# Patient Record
Sex: Female | Born: 1935
Health system: Southern US, Community
[De-identification: ages and names within clinical notes are randomized; demographics above are authoritative.]

## PROBLEM LIST (undated history)

## (undated) ENCOUNTER — Ambulatory Visit (HOSPITAL_BASED_OUTPATIENT_CLINIC_OR_DEPARTMENT_OTHER): Admission: EM | Source: Home / Self Care

## (undated) DIAGNOSIS — G5601 Carpal tunnel syndrome, right upper limb: Secondary | ICD-10-CM

## (undated) DIAGNOSIS — S8262XA Displaced fracture of lateral malleolus of left fibula, initial encounter for closed fracture: Secondary | ICD-10-CM

## (undated) DIAGNOSIS — M79641 Pain in right hand: Secondary | ICD-10-CM

## (undated) DIAGNOSIS — M47812 Spondylosis without myelopathy or radiculopathy, cervical region: Secondary | ICD-10-CM

## (undated) DIAGNOSIS — Z01810 Encounter for preprocedural cardiovascular examination: Secondary | ICD-10-CM

## (undated) DIAGNOSIS — M65331 Trigger finger, right middle finger: Secondary | ICD-10-CM

## (undated) DIAGNOSIS — R55 Syncope and collapse: Secondary | ICD-10-CM

## (undated) DIAGNOSIS — Z950 Presence of cardiac pacemaker: Secondary | ICD-10-CM

## (undated) DIAGNOSIS — M1811 Unilateral primary osteoarthritis of first carpometacarpal joint, right hand: Secondary | ICD-10-CM

## (undated) DIAGNOSIS — I452 Bifascicular block: Secondary | ICD-10-CM

## (undated) DIAGNOSIS — I4510 Unspecified right bundle-branch block: Secondary | ICD-10-CM

## (undated) DIAGNOSIS — I6200 Nontraumatic subdural hemorrhage, unspecified: Secondary | ICD-10-CM

## (undated) DIAGNOSIS — G8929 Other chronic pain: Secondary | ICD-10-CM

## (undated) DIAGNOSIS — K222 Esophageal obstruction: Secondary | ICD-10-CM

## (undated) DIAGNOSIS — E039 Hypothyroidism, unspecified: Secondary | ICD-10-CM

## (undated) DIAGNOSIS — E785 Hyperlipidemia, unspecified: Secondary | ICD-10-CM

## (undated) DIAGNOSIS — T1490XA Injury, unspecified, initial encounter: Secondary | ICD-10-CM

## (undated) HISTORY — DX: Injury, unspecified, initial encounter: T14.90XA

## (undated) HISTORY — DX: Hypothyroidism, unspecified: E03.9

## (undated) HISTORY — PX: TOTAL HIP ARTHROPLASTY: SHX124

## (undated) HISTORY — PX: TONSILLECTOMY: SUR1361

## (undated) HISTORY — DX: Trigger finger, right middle finger: M65.331

## (undated) HISTORY — DX: Spondylosis without myelopathy or radiculopathy, cervical region: M47.812

## (undated) HISTORY — PX: EYE SURGERY: SHX253

## (undated) HISTORY — DX: Unilateral primary osteoarthritis of first carpometacarpal joint, right hand: M18.11

## (undated) HISTORY — DX: Unspecified right bundle-branch block: I45.10

## (undated) HISTORY — PX: ORTHOPEDIC SURGERY: SHX850

## (undated) HISTORY — DX: Nontraumatic subdural hemorrhage, unspecified: I62.00

## (undated) HISTORY — PX: APPENDECTOMY: SHX54

## (undated) HISTORY — DX: Carpal tunnel syndrome, right upper limb: G56.01

## (undated) HISTORY — DX: Other chronic pain: G89.29

## (undated) HISTORY — DX: Displaced fracture of lateral malleolus of left fibula, initial encounter for closed fracture: S82.62XA

## (undated) HISTORY — DX: Encounter for preprocedural cardiovascular examination: Z01.810

## (undated) HISTORY — DX: Hyperlipidemia, unspecified: E78.5

## (undated) HISTORY — DX: Esophageal obstruction: K22.2

## (undated) HISTORY — PX: OTHER SURGICAL HISTORY: SHX169

## (undated) HISTORY — DX: Pain in right hand: M79.641

## (undated) HISTORY — DX: Bifascicular block: I45.2

## (undated) HISTORY — PX: ABDOMINAL HYSTERECTOMY: SHX81

---

## 1989-01-31 DIAGNOSIS — G14 Postpolio syndrome: Secondary | ICD-10-CM

## 1989-01-31 HISTORY — DX: Postpolio syndrome: G14

## 2000-01-06 ENCOUNTER — Encounter: Admission: RE | Admit: 2000-01-06 | Discharge: 2000-01-06 | Payer: Self-pay | Admitting: Otolaryngology

## 2000-01-06 ENCOUNTER — Encounter: Payer: Self-pay | Admitting: Otolaryngology

## 2001-02-24 ENCOUNTER — Encounter: Admission: RE | Admit: 2001-02-24 | Discharge: 2001-02-24 | Payer: Self-pay | Admitting: Orthopedic Surgery

## 2001-02-24 ENCOUNTER — Encounter: Payer: Self-pay | Admitting: Orthopedic Surgery

## 2014-06-10 DIAGNOSIS — Z6822 Body mass index (BMI) 22.0-22.9, adult: Secondary | ICD-10-CM | POA: Diagnosis not present

## 2014-06-10 DIAGNOSIS — M25531 Pain in right wrist: Secondary | ICD-10-CM | POA: Diagnosis not present

## 2014-07-01 DIAGNOSIS — M1811 Unilateral primary osteoarthritis of first carpometacarpal joint, right hand: Secondary | ICD-10-CM | POA: Diagnosis not present

## 2014-07-01 DIAGNOSIS — M654 Radial styloid tenosynovitis [de Quervain]: Secondary | ICD-10-CM | POA: Diagnosis not present

## 2014-07-01 DIAGNOSIS — M65321 Trigger finger, right index finger: Secondary | ICD-10-CM | POA: Diagnosis not present

## 2014-07-01 DIAGNOSIS — M65322 Trigger finger, left index finger: Secondary | ICD-10-CM | POA: Diagnosis not present

## 2014-07-14 DIAGNOSIS — S40022A Contusion of left upper arm, initial encounter: Secondary | ICD-10-CM | POA: Diagnosis not present

## 2014-07-14 DIAGNOSIS — R079 Chest pain, unspecified: Secondary | ICD-10-CM | POA: Diagnosis not present

## 2014-07-14 DIAGNOSIS — S5012XA Contusion of left forearm, initial encounter: Secondary | ICD-10-CM | POA: Diagnosis not present

## 2014-07-14 DIAGNOSIS — R51 Headache: Secondary | ICD-10-CM | POA: Diagnosis not present

## 2014-07-14 DIAGNOSIS — S0990XA Unspecified injury of head, initial encounter: Secondary | ICD-10-CM | POA: Diagnosis not present

## 2014-07-14 DIAGNOSIS — R4182 Altered mental status, unspecified: Secondary | ICD-10-CM | POA: Diagnosis not present

## 2014-07-22 DIAGNOSIS — D509 Iron deficiency anemia, unspecified: Secondary | ICD-10-CM | POA: Diagnosis not present

## 2014-07-22 DIAGNOSIS — Z0000 Encounter for general adult medical examination without abnormal findings: Secondary | ICD-10-CM | POA: Diagnosis not present

## 2014-07-22 DIAGNOSIS — Z23 Encounter for immunization: Secondary | ICD-10-CM | POA: Diagnosis not present

## 2014-07-22 DIAGNOSIS — E039 Hypothyroidism, unspecified: Secondary | ICD-10-CM | POA: Diagnosis not present

## 2014-07-22 DIAGNOSIS — E785 Hyperlipidemia, unspecified: Secondary | ICD-10-CM | POA: Diagnosis not present

## 2014-07-29 DIAGNOSIS — M654 Radial styloid tenosynovitis [de Quervain]: Secondary | ICD-10-CM | POA: Diagnosis not present

## 2014-08-05 DIAGNOSIS — S79921A Unspecified injury of right thigh, initial encounter: Secondary | ICD-10-CM | POA: Diagnosis not present

## 2014-08-05 DIAGNOSIS — M25561 Pain in right knee: Secondary | ICD-10-CM | POA: Diagnosis not present

## 2014-08-05 DIAGNOSIS — Z9181 History of falling: Secondary | ICD-10-CM | POA: Diagnosis not present

## 2014-08-05 DIAGNOSIS — M25551 Pain in right hip: Secondary | ICD-10-CM | POA: Diagnosis not present

## 2014-08-05 DIAGNOSIS — S8991XA Unspecified injury of right lower leg, initial encounter: Secondary | ICD-10-CM | POA: Diagnosis not present

## 2014-08-05 DIAGNOSIS — S79911A Unspecified injury of right hip, initial encounter: Secondary | ICD-10-CM | POA: Diagnosis not present

## 2014-08-05 DIAGNOSIS — M179 Osteoarthritis of knee, unspecified: Secondary | ICD-10-CM | POA: Diagnosis not present

## 2014-08-05 DIAGNOSIS — M899 Disorder of bone, unspecified: Secondary | ICD-10-CM | POA: Diagnosis not present

## 2014-08-05 DIAGNOSIS — Z6822 Body mass index (BMI) 22.0-22.9, adult: Secondary | ICD-10-CM | POA: Diagnosis not present

## 2014-09-09 DIAGNOSIS — M654 Radial styloid tenosynovitis [de Quervain]: Secondary | ICD-10-CM | POA: Diagnosis not present

## 2014-10-07 DIAGNOSIS — M1811 Unilateral primary osteoarthritis of first carpometacarpal joint, right hand: Secondary | ICD-10-CM | POA: Diagnosis not present

## 2014-10-10 DIAGNOSIS — E611 Iron deficiency: Secondary | ICD-10-CM | POA: Diagnosis not present

## 2014-10-10 DIAGNOSIS — Z6822 Body mass index (BMI) 22.0-22.9, adult: Secondary | ICD-10-CM | POA: Diagnosis not present

## 2014-10-10 DIAGNOSIS — R002 Palpitations: Secondary | ICD-10-CM | POA: Diagnosis not present

## 2014-10-10 DIAGNOSIS — E039 Hypothyroidism, unspecified: Secondary | ICD-10-CM | POA: Diagnosis not present

## 2014-12-25 DIAGNOSIS — Z6822 Body mass index (BMI) 22.0-22.9, adult: Secondary | ICD-10-CM | POA: Diagnosis not present

## 2014-12-25 DIAGNOSIS — M179 Osteoarthritis of knee, unspecified: Secondary | ICD-10-CM | POA: Diagnosis not present

## 2014-12-25 DIAGNOSIS — M25562 Pain in left knee: Secondary | ICD-10-CM | POA: Diagnosis not present

## 2015-01-02 DIAGNOSIS — M25462 Effusion, left knee: Secondary | ICD-10-CM | POA: Diagnosis not present

## 2015-01-02 DIAGNOSIS — M1712 Unilateral primary osteoarthritis, left knee: Secondary | ICD-10-CM | POA: Diagnosis not present

## 2015-01-14 DIAGNOSIS — M25562 Pain in left knee: Secondary | ICD-10-CM | POA: Diagnosis not present

## 2015-01-14 DIAGNOSIS — M1712 Unilateral primary osteoarthritis, left knee: Secondary | ICD-10-CM | POA: Diagnosis not present

## 2015-01-16 DIAGNOSIS — M1712 Unilateral primary osteoarthritis, left knee: Secondary | ICD-10-CM | POA: Diagnosis not present

## 2015-01-16 DIAGNOSIS — S83242A Other tear of medial meniscus, current injury, left knee, initial encounter: Secondary | ICD-10-CM | POA: Diagnosis not present

## 2015-01-17 DIAGNOSIS — Z23 Encounter for immunization: Secondary | ICD-10-CM | POA: Diagnosis not present

## 2015-01-17 DIAGNOSIS — I452 Bifascicular block: Secondary | ICD-10-CM | POA: Diagnosis not present

## 2015-01-17 DIAGNOSIS — Z6822 Body mass index (BMI) 22.0-22.9, adult: Secondary | ICD-10-CM | POA: Diagnosis not present

## 2015-01-17 DIAGNOSIS — R399 Unspecified symptoms and signs involving the genitourinary system: Secondary | ICD-10-CM | POA: Diagnosis not present

## 2015-01-17 DIAGNOSIS — M23322 Other meniscus derangements, posterior horn of medial meniscus, left knee: Secondary | ICD-10-CM | POA: Diagnosis not present

## 2015-01-17 DIAGNOSIS — M1712 Unilateral primary osteoarthritis, left knee: Secondary | ICD-10-CM | POA: Diagnosis not present

## 2015-01-23 DIAGNOSIS — E782 Mixed hyperlipidemia: Secondary | ICD-10-CM | POA: Diagnosis not present

## 2015-01-23 DIAGNOSIS — I4510 Unspecified right bundle-branch block: Secondary | ICD-10-CM | POA: Diagnosis not present

## 2015-01-23 DIAGNOSIS — Z01810 Encounter for preprocedural cardiovascular examination: Secondary | ICD-10-CM

## 2015-01-23 HISTORY — DX: Encounter for preprocedural cardiovascular examination: Z01.810

## 2015-01-28 DIAGNOSIS — E782 Mixed hyperlipidemia: Secondary | ICD-10-CM | POA: Diagnosis not present

## 2015-01-28 DIAGNOSIS — I4510 Unspecified right bundle-branch block: Secondary | ICD-10-CM | POA: Diagnosis not present

## 2015-01-28 DIAGNOSIS — Z01810 Encounter for preprocedural cardiovascular examination: Secondary | ICD-10-CM | POA: Diagnosis not present

## 2015-01-31 DIAGNOSIS — E785 Hyperlipidemia, unspecified: Secondary | ICD-10-CM | POA: Diagnosis not present

## 2015-01-31 DIAGNOSIS — D509 Iron deficiency anemia, unspecified: Secondary | ICD-10-CM | POA: Diagnosis not present

## 2015-01-31 DIAGNOSIS — E039 Hypothyroidism, unspecified: Secondary | ICD-10-CM | POA: Diagnosis not present

## 2015-01-31 DIAGNOSIS — N390 Urinary tract infection, site not specified: Secondary | ICD-10-CM | POA: Diagnosis not present

## 2015-01-31 DIAGNOSIS — Z23 Encounter for immunization: Secondary | ICD-10-CM | POA: Diagnosis not present

## 2015-02-11 DIAGNOSIS — S83242A Other tear of medial meniscus, current injury, left knee, initial encounter: Secondary | ICD-10-CM | POA: Diagnosis not present

## 2015-02-11 DIAGNOSIS — M2170 Unequal limb length (acquired), unspecified site: Secondary | ICD-10-CM | POA: Diagnosis not present

## 2015-02-11 DIAGNOSIS — M1712 Unilateral primary osteoarthritis, left knee: Secondary | ICD-10-CM | POA: Diagnosis not present

## 2015-02-11 DIAGNOSIS — Z79899 Other long term (current) drug therapy: Secondary | ICD-10-CM | POA: Diagnosis not present

## 2015-02-11 DIAGNOSIS — K589 Irritable bowel syndrome without diarrhea: Secondary | ICD-10-CM | POA: Diagnosis not present

## 2015-02-11 DIAGNOSIS — G14 Postpolio syndrome: Secondary | ICD-10-CM | POA: Diagnosis not present

## 2015-02-11 DIAGNOSIS — M23321 Other meniscus derangements, posterior horn of medial meniscus, right knee: Secondary | ICD-10-CM | POA: Diagnosis not present

## 2015-02-11 DIAGNOSIS — M65861 Other synovitis and tenosynovitis, right lower leg: Secondary | ICD-10-CM | POA: Diagnosis not present

## 2015-02-11 DIAGNOSIS — K219 Gastro-esophageal reflux disease without esophagitis: Secondary | ICD-10-CM | POA: Diagnosis not present

## 2015-02-11 DIAGNOSIS — S83241A Other tear of medial meniscus, current injury, right knee, initial encounter: Secondary | ICD-10-CM | POA: Diagnosis not present

## 2015-02-11 DIAGNOSIS — H04123 Dry eye syndrome of bilateral lacrimal glands: Secondary | ICD-10-CM | POA: Diagnosis not present

## 2015-02-11 DIAGNOSIS — E039 Hypothyroidism, unspecified: Secondary | ICD-10-CM | POA: Diagnosis not present

## 2015-02-11 DIAGNOSIS — Z8673 Personal history of transient ischemic attack (TIA), and cerebral infarction without residual deficits: Secondary | ICD-10-CM | POA: Diagnosis not present

## 2015-02-11 DIAGNOSIS — H9190 Unspecified hearing loss, unspecified ear: Secondary | ICD-10-CM | POA: Diagnosis not present

## 2015-02-11 DIAGNOSIS — E7800 Pure hypercholesterolemia, unspecified: Secondary | ICD-10-CM | POA: Diagnosis not present

## 2015-02-11 DIAGNOSIS — M659 Synovitis and tenosynovitis, unspecified: Secondary | ICD-10-CM | POA: Diagnosis not present

## 2015-02-12 DIAGNOSIS — M7989 Other specified soft tissue disorders: Secondary | ICD-10-CM | POA: Diagnosis not present

## 2015-02-12 DIAGNOSIS — M79605 Pain in left leg: Secondary | ICD-10-CM | POA: Diagnosis not present

## 2015-02-14 DIAGNOSIS — M23222 Derangement of posterior horn of medial meniscus due to old tear or injury, left knee: Secondary | ICD-10-CM | POA: Diagnosis not present

## 2015-02-14 DIAGNOSIS — M25562 Pain in left knee: Secondary | ICD-10-CM | POA: Diagnosis not present

## 2015-02-19 DIAGNOSIS — M23222 Derangement of posterior horn of medial meniscus due to old tear or injury, left knee: Secondary | ICD-10-CM | POA: Diagnosis not present

## 2015-02-19 DIAGNOSIS — M25562 Pain in left knee: Secondary | ICD-10-CM | POA: Diagnosis not present

## 2015-02-24 DIAGNOSIS — Z1389 Encounter for screening for other disorder: Secondary | ICD-10-CM | POA: Diagnosis not present

## 2015-02-24 DIAGNOSIS — Z9181 History of falling: Secondary | ICD-10-CM | POA: Diagnosis not present

## 2015-02-24 DIAGNOSIS — J208 Acute bronchitis due to other specified organisms: Secondary | ICD-10-CM | POA: Diagnosis not present

## 2015-02-24 DIAGNOSIS — Z6821 Body mass index (BMI) 21.0-21.9, adult: Secondary | ICD-10-CM | POA: Diagnosis not present

## 2015-02-24 DIAGNOSIS — J019 Acute sinusitis, unspecified: Secondary | ICD-10-CM | POA: Diagnosis not present

## 2015-02-26 DIAGNOSIS — M25562 Pain in left knee: Secondary | ICD-10-CM | POA: Diagnosis not present

## 2015-02-26 DIAGNOSIS — M23222 Derangement of posterior horn of medial meniscus due to old tear or injury, left knee: Secondary | ICD-10-CM | POA: Diagnosis not present

## 2015-02-28 DIAGNOSIS — M23222 Derangement of posterior horn of medial meniscus due to old tear or injury, left knee: Secondary | ICD-10-CM | POA: Diagnosis not present

## 2015-02-28 DIAGNOSIS — M25562 Pain in left knee: Secondary | ICD-10-CM | POA: Diagnosis not present

## 2015-03-04 DIAGNOSIS — M25562 Pain in left knee: Secondary | ICD-10-CM | POA: Diagnosis not present

## 2015-03-04 DIAGNOSIS — M23222 Derangement of posterior horn of medial meniscus due to old tear or injury, left knee: Secondary | ICD-10-CM | POA: Diagnosis not present

## 2015-03-11 DIAGNOSIS — M25562 Pain in left knee: Secondary | ICD-10-CM | POA: Diagnosis not present

## 2015-03-11 DIAGNOSIS — M23222 Derangement of posterior horn of medial meniscus due to old tear or injury, left knee: Secondary | ICD-10-CM | POA: Diagnosis not present

## 2015-03-13 DIAGNOSIS — M25562 Pain in left knee: Secondary | ICD-10-CM | POA: Diagnosis not present

## 2015-03-13 DIAGNOSIS — M23222 Derangement of posterior horn of medial meniscus due to old tear or injury, left knee: Secondary | ICD-10-CM | POA: Diagnosis not present

## 2015-04-25 DIAGNOSIS — Z1231 Encounter for screening mammogram for malignant neoplasm of breast: Secondary | ICD-10-CM | POA: Diagnosis not present

## 2015-06-02 DIAGNOSIS — M1712 Unilateral primary osteoarthritis, left knee: Secondary | ICD-10-CM | POA: Diagnosis not present

## 2015-06-02 DIAGNOSIS — S83242A Other tear of medial meniscus, current injury, left knee, initial encounter: Secondary | ICD-10-CM | POA: Diagnosis not present

## 2015-07-29 DIAGNOSIS — T148 Other injury of unspecified body region: Secondary | ICD-10-CM | POA: Diagnosis not present

## 2015-07-29 DIAGNOSIS — Z6822 Body mass index (BMI) 22.0-22.9, adult: Secondary | ICD-10-CM | POA: Diagnosis not present

## 2015-07-29 DIAGNOSIS — M79604 Pain in right leg: Secondary | ICD-10-CM | POA: Diagnosis not present

## 2015-07-29 DIAGNOSIS — I8393 Asymptomatic varicose veins of bilateral lower extremities: Secondary | ICD-10-CM | POA: Diagnosis not present

## 2015-08-17 DIAGNOSIS — M67472 Ganglion, left ankle and foot: Secondary | ICD-10-CM | POA: Diagnosis not present

## 2015-08-19 DIAGNOSIS — Z0000 Encounter for general adult medical examination without abnormal findings: Secondary | ICD-10-CM | POA: Diagnosis not present

## 2015-08-19 DIAGNOSIS — E039 Hypothyroidism, unspecified: Secondary | ICD-10-CM | POA: Diagnosis not present

## 2015-08-19 DIAGNOSIS — Z6822 Body mass index (BMI) 22.0-22.9, adult: Secondary | ICD-10-CM | POA: Diagnosis not present

## 2015-08-19 DIAGNOSIS — D509 Iron deficiency anemia, unspecified: Secondary | ICD-10-CM | POA: Diagnosis not present

## 2015-08-19 DIAGNOSIS — E785 Hyperlipidemia, unspecified: Secondary | ICD-10-CM | POA: Diagnosis not present

## 2015-11-11 DIAGNOSIS — Z6822 Body mass index (BMI) 22.0-22.9, adult: Secondary | ICD-10-CM | POA: Diagnosis not present

## 2015-11-11 DIAGNOSIS — B373 Candidiasis of vulva and vagina: Secondary | ICD-10-CM | POA: Diagnosis not present

## 2015-11-11 DIAGNOSIS — E785 Hyperlipidemia, unspecified: Secondary | ICD-10-CM | POA: Diagnosis not present

## 2015-12-02 DIAGNOSIS — J019 Acute sinusitis, unspecified: Secondary | ICD-10-CM | POA: Diagnosis not present

## 2015-12-24 DIAGNOSIS — H524 Presbyopia: Secondary | ICD-10-CM | POA: Diagnosis not present

## 2015-12-24 DIAGNOSIS — H04123 Dry eye syndrome of bilateral lacrimal glands: Secondary | ICD-10-CM | POA: Diagnosis not present

## 2015-12-30 DIAGNOSIS — N952 Postmenopausal atrophic vaginitis: Secondary | ICD-10-CM | POA: Diagnosis not present

## 2016-01-19 DIAGNOSIS — N9489 Other specified conditions associated with female genital organs and menstrual cycle: Secondary | ICD-10-CM | POA: Diagnosis not present

## 2016-01-30 DIAGNOSIS — M5137 Other intervertebral disc degeneration, lumbosacral region: Secondary | ICD-10-CM | POA: Diagnosis not present

## 2016-01-30 DIAGNOSIS — Z6822 Body mass index (BMI) 22.0-22.9, adult: Secondary | ICD-10-CM | POA: Diagnosis not present

## 2016-01-30 DIAGNOSIS — M4856XA Collapsed vertebra, not elsewhere classified, lumbar region, initial encounter for fracture: Secondary | ICD-10-CM | POA: Diagnosis not present

## 2016-01-30 DIAGNOSIS — M545 Low back pain: Secondary | ICD-10-CM | POA: Diagnosis not present

## 2016-02-06 DIAGNOSIS — M65331 Trigger finger, right middle finger: Secondary | ICD-10-CM | POA: Insufficient documentation

## 2016-02-06 DIAGNOSIS — M47812 Spondylosis without myelopathy or radiculopathy, cervical region: Secondary | ICD-10-CM

## 2016-02-06 DIAGNOSIS — G8929 Other chronic pain: Secondary | ICD-10-CM | POA: Insufficient documentation

## 2016-02-06 DIAGNOSIS — M79641 Pain in right hand: Secondary | ICD-10-CM | POA: Diagnosis not present

## 2016-02-06 DIAGNOSIS — M1811 Unilateral primary osteoarthritis of first carpometacarpal joint, right hand: Secondary | ICD-10-CM | POA: Diagnosis not present

## 2016-02-06 HISTORY — DX: Unilateral primary osteoarthritis of first carpometacarpal joint, right hand: M18.11

## 2016-02-06 HISTORY — DX: Other chronic pain: G89.29

## 2016-02-06 HISTORY — DX: Spondylosis without myelopathy or radiculopathy, cervical region: M47.812

## 2016-02-06 HISTORY — DX: Trigger finger, right middle finger: M65.331

## 2016-02-10 DIAGNOSIS — M5412 Radiculopathy, cervical region: Secondary | ICD-10-CM | POA: Diagnosis not present

## 2016-02-10 DIAGNOSIS — G5601 Carpal tunnel syndrome, right upper limb: Secondary | ICD-10-CM | POA: Diagnosis not present

## 2016-02-11 DIAGNOSIS — M47812 Spondylosis without myelopathy or radiculopathy, cervical region: Secondary | ICD-10-CM | POA: Diagnosis not present

## 2016-02-11 DIAGNOSIS — M65331 Trigger finger, right middle finger: Secondary | ICD-10-CM | POA: Diagnosis not present

## 2016-02-11 DIAGNOSIS — G8929 Other chronic pain: Secondary | ICD-10-CM | POA: Diagnosis not present

## 2016-02-11 DIAGNOSIS — G5601 Carpal tunnel syndrome, right upper limb: Secondary | ICD-10-CM

## 2016-02-11 DIAGNOSIS — M79641 Pain in right hand: Secondary | ICD-10-CM | POA: Diagnosis not present

## 2016-02-11 DIAGNOSIS — M6281 Muscle weakness (generalized): Secondary | ICD-10-CM | POA: Diagnosis not present

## 2016-02-11 DIAGNOSIS — R2689 Other abnormalities of gait and mobility: Secondary | ICD-10-CM | POA: Diagnosis not present

## 2016-02-11 DIAGNOSIS — M545 Low back pain: Secondary | ICD-10-CM | POA: Diagnosis not present

## 2016-02-11 HISTORY — DX: Carpal tunnel syndrome, right upper limb: G56.01

## 2016-02-17 DIAGNOSIS — H1033 Unspecified acute conjunctivitis, bilateral: Secondary | ICD-10-CM | POA: Diagnosis not present

## 2016-02-17 DIAGNOSIS — R599 Enlarged lymph nodes, unspecified: Secondary | ICD-10-CM | POA: Diagnosis not present

## 2016-02-17 DIAGNOSIS — J0100 Acute maxillary sinusitis, unspecified: Secondary | ICD-10-CM | POA: Diagnosis not present

## 2016-02-23 DIAGNOSIS — E039 Hypothyroidism, unspecified: Secondary | ICD-10-CM | POA: Diagnosis not present

## 2016-02-23 DIAGNOSIS — D509 Iron deficiency anemia, unspecified: Secondary | ICD-10-CM | POA: Diagnosis not present

## 2016-02-23 DIAGNOSIS — E785 Hyperlipidemia, unspecified: Secondary | ICD-10-CM | POA: Diagnosis not present

## 2016-02-23 DIAGNOSIS — M5137 Other intervertebral disc degeneration, lumbosacral region: Secondary | ICD-10-CM | POA: Diagnosis not present

## 2016-02-23 DIAGNOSIS — K219 Gastro-esophageal reflux disease without esophagitis: Secondary | ICD-10-CM | POA: Diagnosis not present

## 2016-02-25 DIAGNOSIS — M6281 Muscle weakness (generalized): Secondary | ICD-10-CM | POA: Diagnosis not present

## 2016-02-25 DIAGNOSIS — R2689 Other abnormalities of gait and mobility: Secondary | ICD-10-CM | POA: Diagnosis not present

## 2016-02-25 DIAGNOSIS — H04123 Dry eye syndrome of bilateral lacrimal glands: Secondary | ICD-10-CM | POA: Diagnosis not present

## 2016-02-25 DIAGNOSIS — M545 Low back pain: Secondary | ICD-10-CM | POA: Diagnosis not present

## 2016-02-27 DIAGNOSIS — M6281 Muscle weakness (generalized): Secondary | ICD-10-CM | POA: Diagnosis not present

## 2016-02-27 DIAGNOSIS — M545 Low back pain: Secondary | ICD-10-CM | POA: Diagnosis not present

## 2016-02-27 DIAGNOSIS — R2689 Other abnormalities of gait and mobility: Secondary | ICD-10-CM | POA: Diagnosis not present

## 2016-03-01 DIAGNOSIS — R2689 Other abnormalities of gait and mobility: Secondary | ICD-10-CM | POA: Diagnosis not present

## 2016-03-01 DIAGNOSIS — M6281 Muscle weakness (generalized): Secondary | ICD-10-CM | POA: Diagnosis not present

## 2016-03-01 DIAGNOSIS — M545 Low back pain: Secondary | ICD-10-CM | POA: Diagnosis not present

## 2016-03-08 DIAGNOSIS — R2689 Other abnormalities of gait and mobility: Secondary | ICD-10-CM | POA: Diagnosis not present

## 2016-03-08 DIAGNOSIS — M545 Low back pain: Secondary | ICD-10-CM | POA: Diagnosis not present

## 2016-03-08 DIAGNOSIS — M6281 Muscle weakness (generalized): Secondary | ICD-10-CM | POA: Diagnosis not present

## 2016-03-15 DIAGNOSIS — A084 Viral intestinal infection, unspecified: Secondary | ICD-10-CM | POA: Diagnosis not present

## 2016-03-15 DIAGNOSIS — Z6821 Body mass index (BMI) 21.0-21.9, adult: Secondary | ICD-10-CM | POA: Diagnosis not present

## 2016-03-22 DIAGNOSIS — R531 Weakness: Secondary | ICD-10-CM | POA: Diagnosis not present

## 2016-03-22 DIAGNOSIS — R112 Nausea with vomiting, unspecified: Secondary | ICD-10-CM | POA: Diagnosis not present

## 2016-03-22 DIAGNOSIS — J439 Emphysema, unspecified: Secondary | ICD-10-CM | POA: Diagnosis not present

## 2016-03-22 DIAGNOSIS — E876 Hypokalemia: Secondary | ICD-10-CM | POA: Diagnosis not present

## 2016-03-22 DIAGNOSIS — R197 Diarrhea, unspecified: Secondary | ICD-10-CM | POA: Diagnosis not present

## 2016-03-22 DIAGNOSIS — R404 Transient alteration of awareness: Secondary | ICD-10-CM | POA: Diagnosis not present

## 2016-05-12 DIAGNOSIS — Z1231 Encounter for screening mammogram for malignant neoplasm of breast: Secondary | ICD-10-CM | POA: Diagnosis not present

## 2016-06-15 DIAGNOSIS — Z6821 Body mass index (BMI) 21.0-21.9, adult: Secondary | ICD-10-CM | POA: Diagnosis not present

## 2016-06-15 DIAGNOSIS — J208 Acute bronchitis due to other specified organisms: Secondary | ICD-10-CM | POA: Diagnosis not present

## 2016-06-20 DIAGNOSIS — J209 Acute bronchitis, unspecified: Secondary | ICD-10-CM | POA: Diagnosis not present

## 2016-06-20 DIAGNOSIS — J0100 Acute maxillary sinusitis, unspecified: Secondary | ICD-10-CM | POA: Diagnosis not present

## 2016-06-24 DIAGNOSIS — N390 Urinary tract infection, site not specified: Secondary | ICD-10-CM | POA: Diagnosis not present

## 2016-06-29 DIAGNOSIS — Z6821 Body mass index (BMI) 21.0-21.9, adult: Secondary | ICD-10-CM | POA: Diagnosis not present

## 2016-07-16 DIAGNOSIS — M1711 Unilateral primary osteoarthritis, right knee: Secondary | ICD-10-CM | POA: Diagnosis not present

## 2016-07-19 DIAGNOSIS — M8589 Other specified disorders of bone density and structure, multiple sites: Secondary | ICD-10-CM | POA: Diagnosis not present

## 2016-07-19 DIAGNOSIS — M810 Age-related osteoporosis without current pathological fracture: Secondary | ICD-10-CM | POA: Diagnosis not present

## 2016-07-22 DIAGNOSIS — Z6821 Body mass index (BMI) 21.0-21.9, adult: Secondary | ICD-10-CM | POA: Diagnosis not present

## 2016-07-22 DIAGNOSIS — Z139 Encounter for screening, unspecified: Secondary | ICD-10-CM | POA: Diagnosis not present

## 2016-07-22 DIAGNOSIS — Z9181 History of falling: Secondary | ICD-10-CM | POA: Diagnosis not present

## 2016-07-22 DIAGNOSIS — Z1389 Encounter for screening for other disorder: Secondary | ICD-10-CM | POA: Diagnosis not present

## 2016-07-22 DIAGNOSIS — M810 Age-related osteoporosis without current pathological fracture: Secondary | ICD-10-CM | POA: Diagnosis not present

## 2016-08-25 DIAGNOSIS — D509 Iron deficiency anemia, unspecified: Secondary | ICD-10-CM | POA: Diagnosis not present

## 2016-08-25 DIAGNOSIS — Z23 Encounter for immunization: Secondary | ICD-10-CM | POA: Diagnosis not present

## 2016-08-25 DIAGNOSIS — H04123 Dry eye syndrome of bilateral lacrimal glands: Secondary | ICD-10-CM | POA: Diagnosis not present

## 2016-08-25 DIAGNOSIS — K219 Gastro-esophageal reflux disease without esophagitis: Secondary | ICD-10-CM | POA: Diagnosis not present

## 2016-08-25 DIAGNOSIS — E039 Hypothyroidism, unspecified: Secondary | ICD-10-CM | POA: Diagnosis not present

## 2016-08-25 DIAGNOSIS — E785 Hyperlipidemia, unspecified: Secondary | ICD-10-CM | POA: Diagnosis not present

## 2016-09-14 DIAGNOSIS — R0982 Postnasal drip: Secondary | ICD-10-CM | POA: Diagnosis not present

## 2016-09-14 DIAGNOSIS — M25562 Pain in left knee: Secondary | ICD-10-CM | POA: Diagnosis not present

## 2016-09-14 DIAGNOSIS — Z6822 Body mass index (BMI) 22.0-22.9, adult: Secondary | ICD-10-CM | POA: Diagnosis not present

## 2016-09-17 DIAGNOSIS — R1310 Dysphagia, unspecified: Secondary | ICD-10-CM | POA: Diagnosis not present

## 2016-09-17 DIAGNOSIS — J029 Acute pharyngitis, unspecified: Secondary | ICD-10-CM | POA: Diagnosis not present

## 2016-09-17 DIAGNOSIS — H9190 Unspecified hearing loss, unspecified ear: Secondary | ICD-10-CM | POA: Diagnosis not present

## 2016-09-17 DIAGNOSIS — R490 Dysphonia: Secondary | ICD-10-CM | POA: Diagnosis not present

## 2016-09-17 DIAGNOSIS — R05 Cough: Secondary | ICD-10-CM | POA: Diagnosis not present

## 2016-10-12 DIAGNOSIS — W19XXXA Unspecified fall, initial encounter: Secondary | ICD-10-CM | POA: Diagnosis not present

## 2016-10-12 DIAGNOSIS — K219 Gastro-esophageal reflux disease without esophagitis: Secondary | ICD-10-CM | POA: Diagnosis not present

## 2016-10-12 DIAGNOSIS — R1310 Dysphagia, unspecified: Secondary | ICD-10-CM | POA: Diagnosis not present

## 2016-10-12 DIAGNOSIS — R490 Dysphonia: Secondary | ICD-10-CM | POA: Diagnosis not present

## 2016-10-12 DIAGNOSIS — J384 Edema of larynx: Secondary | ICD-10-CM | POA: Diagnosis not present

## 2016-11-02 DIAGNOSIS — J383 Other diseases of vocal cords: Secondary | ICD-10-CM | POA: Diagnosis not present

## 2016-11-02 DIAGNOSIS — R490 Dysphonia: Secondary | ICD-10-CM | POA: Diagnosis not present

## 2016-11-02 DIAGNOSIS — K219 Gastro-esophageal reflux disease without esophagitis: Secondary | ICD-10-CM | POA: Diagnosis not present

## 2016-11-02 DIAGNOSIS — J342 Deviated nasal septum: Secondary | ICD-10-CM | POA: Diagnosis not present

## 2016-11-17 DIAGNOSIS — M1711 Unilateral primary osteoarthritis, right knee: Secondary | ICD-10-CM | POA: Diagnosis not present

## 2017-01-21 DIAGNOSIS — Z23 Encounter for immunization: Secondary | ICD-10-CM | POA: Diagnosis not present

## 2017-01-27 DIAGNOSIS — H1033 Unspecified acute conjunctivitis, bilateral: Secondary | ICD-10-CM | POA: Diagnosis not present

## 2017-01-27 DIAGNOSIS — Z6821 Body mass index (BMI) 21.0-21.9, adult: Secondary | ICD-10-CM | POA: Diagnosis not present

## 2017-02-12 DIAGNOSIS — E785 Hyperlipidemia, unspecified: Secondary | ICD-10-CM | POA: Diagnosis not present

## 2017-02-12 DIAGNOSIS — M1811 Unilateral primary osteoarthritis of first carpometacarpal joint, right hand: Secondary | ICD-10-CM | POA: Diagnosis not present

## 2017-02-12 DIAGNOSIS — I4581 Long QT syndrome: Secondary | ICD-10-CM | POA: Diagnosis not present

## 2017-02-12 DIAGNOSIS — M810 Age-related osteoporosis without current pathological fracture: Secondary | ICD-10-CM | POA: Diagnosis not present

## 2017-02-12 DIAGNOSIS — S8992XA Unspecified injury of left lower leg, initial encounter: Secondary | ICD-10-CM | POA: Diagnosis not present

## 2017-02-12 DIAGNOSIS — Z981 Arthrodesis status: Secondary | ICD-10-CM | POA: Diagnosis not present

## 2017-02-12 DIAGNOSIS — S8262XA Displaced fracture of lateral malleolus of left fibula, initial encounter for closed fracture: Secondary | ICD-10-CM | POA: Diagnosis not present

## 2017-02-12 DIAGNOSIS — Y998 Other external cause status: Secondary | ICD-10-CM | POA: Diagnosis not present

## 2017-02-12 DIAGNOSIS — S99912A Unspecified injury of left ankle, initial encounter: Secondary | ICD-10-CM | POA: Diagnosis not present

## 2017-02-12 DIAGNOSIS — Y92410 Unspecified street and highway as the place of occurrence of the external cause: Secondary | ICD-10-CM | POA: Diagnosis not present

## 2017-02-12 DIAGNOSIS — S0990XA Unspecified injury of head, initial encounter: Secondary | ICD-10-CM | POA: Diagnosis not present

## 2017-02-12 DIAGNOSIS — R402252 Coma scale, best verbal response, oriented, at arrival to emergency department: Secondary | ICD-10-CM | POA: Diagnosis not present

## 2017-02-12 DIAGNOSIS — M2170 Unequal limb length (acquired), unspecified site: Secondary | ICD-10-CM | POA: Diagnosis not present

## 2017-02-12 DIAGNOSIS — N390 Urinary tract infection, site not specified: Secondary | ICD-10-CM | POA: Diagnosis not present

## 2017-02-12 DIAGNOSIS — Z880 Allergy status to penicillin: Secondary | ICD-10-CM | POA: Diagnosis not present

## 2017-02-12 DIAGNOSIS — M479 Spondylosis, unspecified: Secondary | ICD-10-CM | POA: Diagnosis not present

## 2017-02-12 DIAGNOSIS — R079 Chest pain, unspecified: Secondary | ICD-10-CM | POA: Diagnosis not present

## 2017-02-12 DIAGNOSIS — Z8673 Personal history of transient ischemic attack (TIA), and cerebral infarction without residual deficits: Secondary | ICD-10-CM | POA: Diagnosis not present

## 2017-02-12 DIAGNOSIS — K219 Gastro-esophageal reflux disease without esophagitis: Secondary | ICD-10-CM | POA: Diagnosis not present

## 2017-02-12 DIAGNOSIS — I629 Nontraumatic intracranial hemorrhage, unspecified: Secondary | ICD-10-CM | POA: Diagnosis not present

## 2017-02-12 DIAGNOSIS — S3991XA Unspecified injury of abdomen, initial encounter: Secondary | ICD-10-CM | POA: Diagnosis not present

## 2017-02-12 DIAGNOSIS — R402362 Coma scale, best motor response, obeys commands, at arrival to emergency department: Secondary | ICD-10-CM | POA: Diagnosis not present

## 2017-02-12 DIAGNOSIS — M79671 Pain in right foot: Secondary | ICD-10-CM | POA: Diagnosis not present

## 2017-02-12 DIAGNOSIS — S82892A Other fracture of left lower leg, initial encounter for closed fracture: Secondary | ICD-10-CM | POA: Diagnosis not present

## 2017-02-12 DIAGNOSIS — Z881 Allergy status to other antibiotic agents status: Secondary | ICD-10-CM | POA: Diagnosis not present

## 2017-02-12 DIAGNOSIS — I452 Bifascicular block: Secondary | ICD-10-CM | POA: Diagnosis not present

## 2017-02-12 DIAGNOSIS — M1990 Unspecified osteoarthritis, unspecified site: Secondary | ICD-10-CM | POA: Diagnosis not present

## 2017-02-12 DIAGNOSIS — S99922A Unspecified injury of left foot, initial encounter: Secondary | ICD-10-CM | POA: Diagnosis not present

## 2017-02-12 DIAGNOSIS — S8265XA Nondisplaced fracture of lateral malleolus of left fibula, initial encounter for closed fracture: Secondary | ICD-10-CM | POA: Diagnosis not present

## 2017-02-12 DIAGNOSIS — S199XXA Unspecified injury of neck, initial encounter: Secondary | ICD-10-CM | POA: Diagnosis not present

## 2017-02-12 DIAGNOSIS — Z79899 Other long term (current) drug therapy: Secondary | ICD-10-CM | POA: Diagnosis not present

## 2017-02-12 DIAGNOSIS — R109 Unspecified abdominal pain: Secondary | ICD-10-CM | POA: Diagnosis not present

## 2017-02-12 DIAGNOSIS — R51 Headache: Secondary | ICD-10-CM | POA: Diagnosis not present

## 2017-02-12 DIAGNOSIS — M6281 Muscle weakness (generalized): Secondary | ICD-10-CM | POA: Diagnosis not present

## 2017-02-12 DIAGNOSIS — S8265XD Nondisplaced fracture of lateral malleolus of left fibula, subsequent encounter for closed fracture with routine healing: Secondary | ICD-10-CM | POA: Diagnosis not present

## 2017-02-12 DIAGNOSIS — M25562 Pain in left knee: Secondary | ICD-10-CM | POA: Diagnosis not present

## 2017-02-12 DIAGNOSIS — M7989 Other specified soft tissue disorders: Secondary | ICD-10-CM | POA: Diagnosis not present

## 2017-02-12 DIAGNOSIS — I444 Left anterior fascicular block: Secondary | ICD-10-CM | POA: Diagnosis not present

## 2017-02-12 DIAGNOSIS — R262 Difficulty in walking, not elsewhere classified: Secondary | ICD-10-CM | POA: Diagnosis not present

## 2017-02-12 DIAGNOSIS — I440 Atrioventricular block, first degree: Secondary | ICD-10-CM | POA: Diagnosis not present

## 2017-02-12 DIAGNOSIS — R402142 Coma scale, eyes open, spontaneous, at arrival to emergency department: Secondary | ICD-10-CM | POA: Diagnosis not present

## 2017-02-12 DIAGNOSIS — S3993XA Unspecified injury of pelvis, initial encounter: Secondary | ICD-10-CM | POA: Diagnosis not present

## 2017-02-12 DIAGNOSIS — S81812A Laceration without foreign body, left lower leg, initial encounter: Secondary | ICD-10-CM | POA: Diagnosis not present

## 2017-02-12 DIAGNOSIS — M79672 Pain in left foot: Secondary | ICD-10-CM | POA: Diagnosis not present

## 2017-02-12 DIAGNOSIS — Z7409 Other reduced mobility: Secondary | ICD-10-CM | POA: Diagnosis not present

## 2017-02-12 DIAGNOSIS — Z882 Allergy status to sulfonamides status: Secondary | ICD-10-CM | POA: Diagnosis not present

## 2017-02-12 DIAGNOSIS — J3489 Other specified disorders of nose and nasal sinuses: Secondary | ICD-10-CM | POA: Diagnosis not present

## 2017-02-12 DIAGNOSIS — M542 Cervicalgia: Secondary | ICD-10-CM | POA: Diagnosis not present

## 2017-02-12 DIAGNOSIS — S065X9A Traumatic subdural hemorrhage with loss of consciousness of unspecified duration, initial encounter: Secondary | ICD-10-CM | POA: Diagnosis not present

## 2017-02-12 DIAGNOSIS — S299XXA Unspecified injury of thorax, initial encounter: Secondary | ICD-10-CM | POA: Diagnosis not present

## 2017-02-12 DIAGNOSIS — S99921A Unspecified injury of right foot, initial encounter: Secondary | ICD-10-CM | POA: Diagnosis not present

## 2017-02-12 DIAGNOSIS — S8991XA Unspecified injury of right lower leg, initial encounter: Secondary | ICD-10-CM | POA: Diagnosis not present

## 2017-02-12 DIAGNOSIS — M25561 Pain in right knee: Secondary | ICD-10-CM | POA: Diagnosis not present

## 2017-02-12 DIAGNOSIS — H903 Sensorineural hearing loss, bilateral: Secondary | ICD-10-CM | POA: Diagnosis not present

## 2017-02-12 DIAGNOSIS — Z79890 Hormone replacement therapy (postmenopausal): Secondary | ICD-10-CM | POA: Diagnosis not present

## 2017-02-12 DIAGNOSIS — Z741 Need for assistance with personal care: Secondary | ICD-10-CM | POA: Diagnosis not present

## 2017-02-12 DIAGNOSIS — E7800 Pure hypercholesterolemia, unspecified: Secondary | ICD-10-CM | POA: Diagnosis not present

## 2017-02-13 DIAGNOSIS — I6200 Nontraumatic subdural hemorrhage, unspecified: Secondary | ICD-10-CM

## 2017-02-13 DIAGNOSIS — S8262XA Displaced fracture of lateral malleolus of left fibula, initial encounter for closed fracture: Secondary | ICD-10-CM

## 2017-02-13 DIAGNOSIS — T1490XA Injury, unspecified, initial encounter: Secondary | ICD-10-CM

## 2017-02-13 HISTORY — DX: Displaced fracture of lateral malleolus of left fibula, initial encounter for closed fracture: S82.62XA

## 2017-02-13 HISTORY — DX: Nontraumatic subdural hemorrhage, unspecified: I62.00

## 2017-02-13 HISTORY — DX: Injury, unspecified, initial encounter: T14.90XA

## 2017-02-16 DIAGNOSIS — M1990 Unspecified osteoarthritis, unspecified site: Secondary | ICD-10-CM | POA: Diagnosis not present

## 2017-02-16 DIAGNOSIS — S8262XA Displaced fracture of lateral malleolus of left fibula, initial encounter for closed fracture: Secondary | ICD-10-CM | POA: Diagnosis not present

## 2017-02-16 DIAGNOSIS — R262 Difficulty in walking, not elsewhere classified: Secondary | ICD-10-CM | POA: Diagnosis not present

## 2017-02-16 DIAGNOSIS — J3489 Other specified disorders of nose and nasal sinuses: Secondary | ICD-10-CM | POA: Diagnosis not present

## 2017-02-16 DIAGNOSIS — N390 Urinary tract infection, site not specified: Secondary | ICD-10-CM | POA: Diagnosis not present

## 2017-02-16 DIAGNOSIS — Z7409 Other reduced mobility: Secondary | ICD-10-CM | POA: Diagnosis not present

## 2017-02-16 DIAGNOSIS — S8265XD Nondisplaced fracture of lateral malleolus of left fibula, subsequent encounter for closed fracture with routine healing: Secondary | ICD-10-CM | POA: Diagnosis not present

## 2017-02-16 DIAGNOSIS — E7800 Pure hypercholesterolemia, unspecified: Secondary | ICD-10-CM | POA: Diagnosis not present

## 2017-02-16 DIAGNOSIS — E785 Hyperlipidemia, unspecified: Secondary | ICD-10-CM | POA: Diagnosis not present

## 2017-02-16 DIAGNOSIS — Z741 Need for assistance with personal care: Secondary | ICD-10-CM | POA: Diagnosis not present

## 2017-02-16 DIAGNOSIS — K219 Gastro-esophageal reflux disease without esophagitis: Secondary | ICD-10-CM | POA: Diagnosis not present

## 2017-02-16 DIAGNOSIS — M479 Spondylosis, unspecified: Secondary | ICD-10-CM | POA: Diagnosis not present

## 2017-02-16 DIAGNOSIS — Z8673 Personal history of transient ischemic attack (TIA), and cerebral infarction without residual deficits: Secondary | ICD-10-CM | POA: Diagnosis not present

## 2017-02-16 DIAGNOSIS — M6281 Muscle weakness (generalized): Secondary | ICD-10-CM | POA: Diagnosis not present

## 2017-02-16 DIAGNOSIS — S065X9A Traumatic subdural hemorrhage with loss of consciousness of unspecified duration, initial encounter: Secondary | ICD-10-CM | POA: Diagnosis not present

## 2017-02-18 DIAGNOSIS — R262 Difficulty in walking, not elsewhere classified: Secondary | ICD-10-CM | POA: Diagnosis not present

## 2017-03-10 DIAGNOSIS — Z79899 Other long term (current) drug therapy: Secondary | ICD-10-CM | POA: Diagnosis not present

## 2017-03-10 DIAGNOSIS — S81802A Unspecified open wound, left lower leg, initial encounter: Secondary | ICD-10-CM | POA: Diagnosis not present

## 2017-03-10 DIAGNOSIS — R55 Syncope and collapse: Secondary | ICD-10-CM | POA: Diagnosis not present

## 2017-03-10 DIAGNOSIS — S065X9A Traumatic subdural hemorrhage with loss of consciousness of unspecified duration, initial encounter: Secondary | ICD-10-CM | POA: Diagnosis not present

## 2017-03-10 DIAGNOSIS — S8262XA Displaced fracture of lateral malleolus of left fibula, initial encounter for closed fracture: Secondary | ICD-10-CM | POA: Diagnosis not present

## 2017-03-11 DIAGNOSIS — S81812D Laceration without foreign body, left lower leg, subsequent encounter: Secondary | ICD-10-CM | POA: Diagnosis not present

## 2017-03-11 DIAGNOSIS — S065X0D Traumatic subdural hemorrhage without loss of consciousness, subsequent encounter: Secondary | ICD-10-CM | POA: Diagnosis not present

## 2017-03-11 DIAGNOSIS — S8265XD Nondisplaced fracture of lateral malleolus of left fibula, subsequent encounter for closed fracture with routine healing: Secondary | ICD-10-CM | POA: Diagnosis not present

## 2017-03-11 DIAGNOSIS — D649 Anemia, unspecified: Secondary | ICD-10-CM | POA: Diagnosis not present

## 2017-03-11 DIAGNOSIS — M2241 Chondromalacia patellae, right knee: Secondary | ICD-10-CM | POA: Diagnosis not present

## 2017-03-15 DIAGNOSIS — S82832D Other fracture of upper and lower end of left fibula, subsequent encounter for closed fracture with routine healing: Secondary | ICD-10-CM | POA: Diagnosis not present

## 2017-03-15 DIAGNOSIS — E785 Hyperlipidemia, unspecified: Secondary | ICD-10-CM | POA: Diagnosis not present

## 2017-03-15 DIAGNOSIS — S8262XD Displaced fracture of lateral malleolus of left fibula, subsequent encounter for closed fracture with routine healing: Secondary | ICD-10-CM | POA: Diagnosis not present

## 2017-03-15 DIAGNOSIS — Z79899 Other long term (current) drug therapy: Secondary | ICD-10-CM | POA: Diagnosis not present

## 2017-03-15 DIAGNOSIS — M7752 Other enthesopathy of left foot: Secondary | ICD-10-CM | POA: Diagnosis not present

## 2017-03-15 DIAGNOSIS — S065X0A Traumatic subdural hemorrhage without loss of consciousness, initial encounter: Secondary | ICD-10-CM | POA: Diagnosis not present

## 2017-03-15 DIAGNOSIS — Z880 Allergy status to penicillin: Secondary | ICD-10-CM | POA: Diagnosis not present

## 2017-03-15 DIAGNOSIS — M7989 Other specified soft tissue disorders: Secondary | ICD-10-CM | POA: Diagnosis not present

## 2017-03-15 DIAGNOSIS — S065X9A Traumatic subdural hemorrhage with loss of consciousness of unspecified duration, initial encounter: Secondary | ICD-10-CM | POA: Diagnosis not present

## 2017-03-15 DIAGNOSIS — Z881 Allergy status to other antibiotic agents status: Secondary | ICD-10-CM | POA: Diagnosis not present

## 2017-03-15 DIAGNOSIS — E7800 Pure hypercholesterolemia, unspecified: Secondary | ICD-10-CM | POA: Diagnosis not present

## 2017-03-15 DIAGNOSIS — Z882 Allergy status to sulfonamides status: Secondary | ICD-10-CM | POA: Diagnosis not present

## 2017-03-15 DIAGNOSIS — M85872 Other specified disorders of bone density and structure, left ankle and foot: Secondary | ICD-10-CM | POA: Diagnosis not present

## 2017-03-23 DIAGNOSIS — S065X9A Traumatic subdural hemorrhage with loss of consciousness of unspecified duration, initial encounter: Secondary | ICD-10-CM | POA: Diagnosis not present

## 2017-03-23 DIAGNOSIS — M25561 Pain in right knee: Secondary | ICD-10-CM | POA: Diagnosis not present

## 2017-03-23 DIAGNOSIS — M5137 Other intervertebral disc degeneration, lumbosacral region: Secondary | ICD-10-CM | POA: Diagnosis not present

## 2017-03-23 DIAGNOSIS — R55 Syncope and collapse: Secondary | ICD-10-CM | POA: Diagnosis not present

## 2017-03-25 DIAGNOSIS — S065X9A Traumatic subdural hemorrhage with loss of consciousness of unspecified duration, initial encounter: Secondary | ICD-10-CM | POA: Diagnosis not present

## 2017-03-30 DIAGNOSIS — E785 Hyperlipidemia, unspecified: Secondary | ICD-10-CM | POA: Insufficient documentation

## 2017-03-30 DIAGNOSIS — I452 Bifascicular block: Secondary | ICD-10-CM | POA: Insufficient documentation

## 2017-03-30 DIAGNOSIS — E039 Hypothyroidism, unspecified: Secondary | ICD-10-CM

## 2017-03-30 DIAGNOSIS — K222 Esophageal obstruction: Secondary | ICD-10-CM | POA: Insufficient documentation

## 2017-03-30 DIAGNOSIS — I4510 Unspecified right bundle-branch block: Secondary | ICD-10-CM

## 2017-03-30 DIAGNOSIS — R55 Syncope and collapse: Secondary | ICD-10-CM | POA: Insufficient documentation

## 2017-03-30 HISTORY — DX: Hyperlipidemia, unspecified: E78.5

## 2017-03-30 HISTORY — DX: Hypothyroidism, unspecified: E03.9

## 2017-03-30 HISTORY — DX: Bifascicular block: I45.2

## 2017-03-30 HISTORY — DX: Unspecified right bundle-branch block: I45.10

## 2017-03-30 HISTORY — DX: Esophageal obstruction: K22.2

## 2017-03-31 ENCOUNTER — Ambulatory Visit: Payer: Medicare Other | Admitting: Cardiology

## 2017-03-31 ENCOUNTER — Encounter: Payer: Self-pay | Admitting: Cardiology

## 2017-03-31 VITALS — BP 130/70 | HR 65 | Ht 60.0 in | Wt 120.0 lb

## 2017-03-31 DIAGNOSIS — I452 Bifascicular block: Secondary | ICD-10-CM

## 2017-03-31 DIAGNOSIS — R55 Syncope and collapse: Secondary | ICD-10-CM

## 2017-03-31 DIAGNOSIS — I10 Essential (primary) hypertension: Secondary | ICD-10-CM
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
## 2017-03-31 NOTE — Patient Instructions (Signed)
Medication Instructions:  Your physician recommends that you continue on your current medications as directed. Please refer to the Current Medication list given to you today.   Labwork: None  Testing/Procedures: None  Follow-Up: Your physician recommends that you schedule a follow-up as needed if symptoms worsen or fail to improve. Dr. Curt Bears office will contact you about pacemaker.  Any Other Special Instructions Will Be Listed Below (If Applicable).     If you need a refill on your cardiac medications before your next appointment, please call your pharmacy.

## 2017-04-01 ENCOUNTER — Telehealth: Payer: Self-pay | Admitting: Cardiology

## 2017-04-01 NOTE — Telephone Encounter (Signed)
Patient advised that she will have an appointment to discuss the pacemaker with an electrophysiologist before the pacemaker is inserted. Patient just wanted to make sure that someone would explain the procedure to her prior to having the pacemaker inserted. Patient reassured she would have an appointment to discuss before. Patient verbalized understanding. No further questions.

## 2017-04-01 NOTE — Telephone Encounter (Signed)
Patient needs information about having a pacemaker put in. Saw doctor yesterday but did not ask for any information. Can information be sent with Julia Hunt? She will be in office for appt later.

## 2017-04-04 DIAGNOSIS — S8001XS Contusion of right knee, sequela: Secondary | ICD-10-CM | POA: Diagnosis not present

## 2017-04-04 DIAGNOSIS — M79672 Pain in left foot: Secondary | ICD-10-CM | POA: Diagnosis not present

## 2017-04-04 DIAGNOSIS — M1711 Unilateral primary osteoarthritis, right knee: Secondary | ICD-10-CM | POA: Diagnosis not present

## 2017-04-07 DIAGNOSIS — E785 Hyperlipidemia, unspecified: Secondary | ICD-10-CM | POA: Diagnosis not present

## 2017-04-07 DIAGNOSIS — E039 Hypothyroidism, unspecified: Secondary | ICD-10-CM | POA: Diagnosis not present

## 2017-04-07 DIAGNOSIS — D509 Iron deficiency anemia, unspecified: Secondary | ICD-10-CM | POA: Diagnosis not present

## 2017-04-21 DIAGNOSIS — Z139 Encounter for screening, unspecified: Secondary | ICD-10-CM | POA: Diagnosis not present

## 2017-04-21 DIAGNOSIS — E785 Hyperlipidemia, unspecified: Secondary | ICD-10-CM | POA: Diagnosis not present

## 2017-04-21 DIAGNOSIS — K219 Gastro-esophageal reflux disease without esophagitis: Secondary | ICD-10-CM | POA: Diagnosis not present

## 2017-04-21 DIAGNOSIS — D509 Iron deficiency anemia, unspecified: Secondary | ICD-10-CM | POA: Diagnosis not present

## 2017-04-21 DIAGNOSIS — E039 Hypothyroidism, unspecified: Secondary | ICD-10-CM | POA: Diagnosis not present

## 2017-04-27 ENCOUNTER — Encounter: Payer: Self-pay | Admitting: Cardiology

## 2017-04-27 ENCOUNTER — Other Ambulatory Visit: Payer: Self-pay | Admitting: Cardiology

## 2017-04-27 ENCOUNTER — Encounter: Payer: Self-pay | Admitting: *Deleted

## 2017-04-27 ENCOUNTER — Ambulatory Visit (INDEPENDENT_AMBULATORY_CARE_PROVIDER_SITE_OTHER): Payer: Medicare Other | Admitting: Cardiology

## 2017-04-27 VITALS — BP 117/71 | HR 88 | Ht 61.0 in | Wt 116.0 lb

## 2017-04-27 DIAGNOSIS — Z01812 Encounter for preprocedural laboratory examination: Secondary | ICD-10-CM | POA: Diagnosis not present

## 2017-04-27 DIAGNOSIS — I10 Essential (primary) hypertension: Secondary | ICD-10-CM | POA: Diagnosis not present

## 2017-04-27 DIAGNOSIS — I453 Trifascicular block: Secondary | ICD-10-CM

## 2017-04-27 DIAGNOSIS — R55 Syncope and collapse: Secondary | ICD-10-CM

## 2017-04-27 NOTE — Addendum Note (Signed)
Addended by: Stanton Kidney on: 04/27/2017 04:34 PM   Modules accepted: Orders
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
## 2017-04-27 NOTE — Patient Instructions (Signed)
Medication Instructions:  Your physician recommends that you continue on your current medications as directed. Please refer to the Current Medication list given to you today.     * If you need a refill on your cardiac medications before your next appointment, please call your pharmacy. *  Labwork: Today pre procedure lab work: BMET & CBC w/ diff  Testing/Procedures: Your physician has recommended that you have a defibrillator inserted. An implantable cardioverter defibrillator (ICD) is a small device that is placed in your chest or, in rare cases, your abdomen. This device uses electrical pulses or shocks to help control life-threatening, irregular heartbeats that could lead the heart to suddenly stop beating (sudden cardiac arrest). Leads are attached to the ICD that goes into your heart. This is done in the hospital and usually requires an overnight stay. Please see the instruction sheet given to you today for more information.  Follow-Up: Your physician recommends that you schedule a wound check appointment 10-14 days, after your procedure on 05/03/2017, with the device clinic.  Your physician recommends that you schedule a follow up appointment in 91 days, after your procedure on 05/03/2017, with Dr. Curt Bears.  Thank you for choosing [facility]!!   Trinidad Curet, RN [phone]  Any Other Special Instructions Will Be Listed Below (If Applicable).  Pacemaker Implantation, Adult Pacemaker implantation is a procedure to place a pacemaker inside your chest. A pacemaker is a small computer that sends electrical signals to the heart and helps your heart beat normally. A pacemaker also stores information about your heart rhythms. You may need pacemaker implantation if you:  Have a slow heartbeat (bradycardia).  Faint (syncope).  Have shortness of breath (dyspnea) due to heart problems.  The pacemaker attaches to your heart through a wire, called a lead. Sometimes just one lead is  needed. Other times, there will be two leads. There are two types of pacemakers:  Transvenous pacemaker. This type is placed under the skin or muscle of your chest. The lead goes through a vein in the chest area to reach the inside of the heart.  Epicardial pacemaker. This type is placed under the skin or muscle of your chest or belly. The lead goes through your chest to the outside of the heart.  Tell a health care provider about:  Any allergies you have.  All medicines you are taking, including vitamins, herbs, eye drops, creams, and over-the-counter medicines.  Any problems you or family members have had with anesthetic medicines.  Any blood or bone disorders you have.  Any surgeries you have had.  Any medical conditions you have.  Whether you are pregnant or may be pregnant. What are the risks? Generally, this is a safe procedure. However, problems may occur, including:  Infection.  Bleeding.  Failure of the pacemaker or the lead.  Collapse of a lung or bleeding into a lung.  Blood clot inside a blood vessel with a lead.  Damage to the heart.  Infection inside the heart (endocarditis).  Allergic reactions to medicines.  What happens before the procedure? Staying hydrated Follow instructions from your health care provider about hydration, which may include:  Up to 2 hours before the procedure - you may continue to drink clear liquids, such as water, clear fruit juice, black coffee, and plain tea.  Eating and drinking restrictions Follow instructions from your health care provider about eating and drinking, which may include:  8 hours before the procedure - stop eating heavy meals or foods such as  meat, fried foods, or fatty foods.  6 hours before the procedure - stop eating light meals or foods, such as toast or cereal.  6 hours before the procedure - stop drinking milk or drinks that contain milk.  2 hours before the procedure - stop drinking clear  liquids.  Medicines  Ask your health care provider about: ? Changing or stopping your regular medicines. This is especially important if you are taking diabetes medicines or blood thinners. ? Taking medicines such as aspirin and ibuprofen. These medicines can thin your blood. Do not take these medicines before your procedure if your health care provider instructs you not to.  You may be given antibiotic medicine to help prevent infection. General instructions  You will have a heart evaluation. This may include an electrocardiogram (ECG), chest X-ray, and heart imaging (echocardiogram,  or echo) tests.  You will have blood tests.  Do not use any products that contain nicotine or tobacco, such as cigarettes and e-cigarettes. If you need help quitting, ask your health care provider.  Plan to have someone take you home from the hospital or clinic.  If you will be going home right after the procedure, plan to have someone with you for 24 hours.  Ask your health care provider how your surgical site will be marked or identified. What happens during the procedure?  To reduce your risk of infection: ? Your health care team will wash or sanitize their hands. ? Your skin will be washed with soap. ? Hair may be removed from the surgical area.  An IV tube will be inserted into one of your veins.  You will be given one or more of the following: ? A medicine to help you relax (sedative). ? A medicine to numb the area (local anesthetic). ? A medicine to make you fall asleep (general anesthetic).  If you are getting a transvenous pacemaker: ? An incision will be made in your upper chest. ? A pocket will be made for the pacemaker. It may be placed under the skin or between layers of muscle. ? The lead will be inserted into a blood vessel that returns to the heart. ? While X-rays are taken by an imaging machine (fluoroscopy), the lead will be advanced through the vein to the inside of your  heart. ? The other end of the lead will be tunneled under the skin and attached to the pacemaker.  If you are getting an epicardial pacemaker: ? An incision will be made near your ribs or breastbone (sternum) for the lead. ? The lead will be attached to the outside of your heart. ? Another incision will be made in your chest or upper belly to create a pocket for the pacemaker. ? The free end of the lead will be tunneled under the skin and attached to the pacemaker.  The transvenous or epicardial pacemaker will be tested. Imaging studies may be done to check the lead position.  The incisions will be closed with stitches (sutures), adhesive strips, or skin glue.  Bandages (dressing) will be placed over the incisions. The procedure may vary among health care providers and hospitals. What happens after the procedure?  Your blood pressure, heart rate, breathing rate, and blood oxygen level will be monitored until the medicines you were given have worn off.  You will be given antibiotics and pain medicine.  ECG and chest x-rays will be done.  You will wear a continuous type of ECG (Holter monitor) to check your heart rhythm.  Your health care provider willprogram the pacemaker.  Do not drive for 24 hours if you received a sedative. This information is not intended to replace advice given to you by your health care provider. Make sure you discuss any questions you have with your health care provider. Document Released: 03/05/2002 Document Revised: 10/03/2015 Document Reviewed: 08/27/2015 Elsevier Interactive Patient Education  2018 [employer].   Pacemaker Implantation, Adult, Care After This sheet gives you information about how to care for yourself after your procedure. Your health care provider may also give you more specific instructions. If you have problems or questions, contact your health care provider. What can I expect after the procedure? After the procedure, it is common  to have:  Mild pain.  Slight bruising.  Some swelling over the incision.  A slight bump over the skin where the device was placed. Sometimes, it is possible to feel the device under the skin. This is normal.  Follow these instructions at home: Medicines  Take over-the-counter and prescription medicines only as told by your health care provider.  If you were prescribed an antibiotic medicine, take it as told by your health care provider. Do not stop taking the antibiotic even if you start to feel better. Wound care  Do not remove the bandage on your chest until directed to do so by your health care provider.  After your bandage is removed, you may see pieces of tape called skin adhesive strips over the area where the cut was made (incision site). Let them fall off on their own.  Check the incision site every day to make sure it is not infected, bleeding, or starting to pull apart.  Do not use lotions or ointments near the incision site unless directed to do so.  Keep the incision area clean and dry for 2-3 days after the procedure or as directed by your health care provider. It takes several weeks for the incision site to completely heal.  Do not take baths, swim, or use a hot tub for 7-10 days or as otherwise directed by your health care provider. Activity  Do not drive or use heavy machinery while taking prescription pain medicine.  Do not drive for 24 hours if you were given a medicine to help you relax (sedative).  Check with your health care provider before you start to drive or play sports.  Avoid sudden jerking, pulling, or chopping movements that pull your upper arm far away from your body. Avoid these movements for at least 6 weeks or as long as told by your health care provider.  Do not lift your upper arm above your shoulders for at least 6 weeks or as long as told by your health care provider. This means no tennis, golf, or swimming.  You may go back to work when  your health care provider says it is okay. Pacemaker care  You may be shown how to transfer data from your pacemaker through the phone to your health care provider.  Always let all health care providers know about your pacemaker before you have any medical procedures or tests.  Wear a medical ID bracelet or necklace stating that you have a pacemaker. Carry a pacemaker ID card with you at all times.  Your pacemaker battery will last for 5-15 years. Routine checks by your health care provider will let the health care provider know when the battery is starting to run down. The pacemaker will need to be replaced when the battery starts to run  down.  Do not use amateur radio equipment or Pharmacologist. Other electrical devices are safe to use, including power tools, lawn mowers, and speakers. If you are unsure of whether something is safe to use, ask your health care provider.  When using your cell phone, hold it to the ear opposite the pacemaker. Do not leave your cell phone in a pocket over the pacemaker.  Avoid places or objects that have a strong electric or magnetic field, including: ? Airport Herbalist. When at the airport, let officials know that you have a pacemaker. ? Power plants. ? Large electrical generators. ? Radiofrequency transmission towers, such as cell phone and radio towers. General instructions  Weigh yourself every day. If you suddenly gain weight, fluid may be building up in your body.  Keep all follow-up visits as told by your health care provider. This is important. Contact a health care provider if:  You gain weight suddenly.  Your legs or feet swell.  It feels like your heart is fluttering or skipping beats (heart palpitations).  You have chills or a fever.  You have more redness, swelling, or pain around your incisions.  You have more fluid or blood coming from your incisions.  Your incisions feel warm to the touch.  You have pus or a  bad smell coming from your incisions. Get help right away if:  You have chest pain.  You have trouble breathing or are short of breath.  You become extremely tired.  You are light-headed or you faint. This information is not intended to replace advice given to you by your health care provider. Make sure you discuss any questions you have with your health care provider. Document Released: 10/02/2004 Document Revised: 12/26/2015 Document Reviewed: 12/26/2015 Elsevier Interactive Patient Education  2018 [employer] Discharge Instructions for  Pacemaker/Defibrillator Patients  Activity No heavy lifting or vigorous activity with your left/right arm for 6 to 8 weeks.  Do not raise your left/right arm above your head for one week.  Gradually raise your affected arm as drawn below.           __  NO DRIVING for     ; you may begin driving on     .  WOUND CARE - Keep the wound area clean and dry.  Do not get this area wet for one week. No showers for one week; you may shower on     . - The tape/steri-strips on your wound will fall off; do not pull them off.  No bandage is needed on the site.  DO  NOT apply any creams, oils, or ointments to the wound area. - If you notice any drainage or discharge from the wound, any swelling or bruising at the site, or you develop a fever > 101? F after you are discharged home, call the office at once.  Special Instructions - You are still able to use cellular telephones; use the ear opposite the side where you have your pacemaker/defibrillator.  Avoid carrying your cellular phone near your device. - When traveling through airports, show security personnel your identification card to avoid being screened in the metal detectors.  Ask the security personnel to use the hand wand. - Avoid arc welding equipment, MRI testing (magnetic resonance imaging), TENS units (transcutaneous nerve stimulators).  Call the office for questions about  other devices. - Avoid electrical appliances that are in poor condition or are not properly grounded. - Microwave ovens  are safe to be near or to operate.

## 2017-04-28 LAB — CBC WITH DIFFERENTIAL/PLATELET
Basophils Absolute: 0 10*3/uL (ref 0.0–0.2)
Basos: 0 %
EOS (ABSOLUTE): 0.1 10*3/uL (ref 0.0–0.4)
EOS: 2 %
HEMATOCRIT: 38.8 % (ref 34.0–46.6)
Hemoglobin: 12.2 g/dL (ref 11.1–15.9)
Immature Grans (Abs): 0 10*3/uL (ref 0.0–0.1)
Immature Granulocytes: 0 %
Lymphocytes Absolute: 1.6 10*3/uL (ref 0.7–3.1)
Lymphs: 23 %
MCH: 26.2 pg — ABNORMAL LOW (ref 26.6–33.0)
MCHC: 31.4 g/dL — ABNORMAL LOW (ref 31.5–35.7)
MCV: 83 fL (ref 79–97)
MONOS ABS: 0.5 10*3/uL (ref 0.1–0.9)
Monocytes: 7 %
NEUTROS PCT: 68 %
Neutrophils Absolute: 4.5 10*3/uL (ref 1.4–7.0)
PLATELETS: 230 10*3/uL (ref 150–379)
RBC: 4.65 x10E6/uL (ref 3.77–5.28)
RDW: 14.9 % (ref 12.3–15.4)
WBC: 6.7 10*3/uL (ref 3.4–10.8)

## 2017-04-28 LAB — BASIC METABOLIC PANEL
BUN / CREAT RATIO: 23 (ref 12–28)
BUN: 23 mg/dL (ref 8–27)
CO2: 25 mmol/L (ref 20–29)
CREATININE: 1.02 mg/dL — AB (ref 0.57–1.00)
Calcium: 9 mg/dL (ref 8.7–10.3)
Chloride: 107 mmol/L — ABNORMAL HIGH (ref 96–106)
GFR calc Af Amer: 57 mL/min/{1.73_m2} — ABNORMAL LOW (ref >59)
GFR calc non Af Amer: 50 mL/min/{1.73_m2} — ABNORMAL LOW (ref >59)
GLUCOSE: 98 mg/dL (ref 65–99)
POTASSIUM: 4 mmol/L (ref 3.5–5.2)
SODIUM: 145 mmol/L — AB (ref 134–144)

## 2017-05-02 ENCOUNTER — Telehealth: Payer: Self-pay | Admitting: *Deleted

## 2017-05-02 NOTE — Telephone Encounter (Signed)
Informed pt procedure time moved up an hour.  She is aware to arrive for PPM implant tomorrow at 9am for 11am procedure. Patient verbalized understanding and agreeable to plan.

## 2017-05-03 ENCOUNTER — Ambulatory Visit (HOSPITAL_COMMUNITY)
Admission: RE | Admit: 2017-05-03 | Discharge: 2017-05-04 | Disposition: A | Discharge status: Home / Self Care | Payer: Medicare Other | Source: Ambulatory Visit | Attending: Cardiology | Admitting: Cardiology

## 2017-05-03 ENCOUNTER — Encounter (HOSPITAL_COMMUNITY): Payer: Self-pay | Admitting: Cardiology

## 2017-05-03 ENCOUNTER — Encounter (HOSPITAL_COMMUNITY)
Admission: RE | Disposition: A | Discharge status: Home / Self Care | Payer: Self-pay | Source: Ambulatory Visit | Attending: Cardiology

## 2017-05-03 DIAGNOSIS — Z880 Allergy status to penicillin: Secondary | ICD-10-CM | POA: Diagnosis not present

## 2017-05-03 DIAGNOSIS — E785 Hyperlipidemia, unspecified: Secondary | ICD-10-CM | POA: Insufficient documentation

## 2017-05-03 DIAGNOSIS — Z95818 Presence of other cardiac implants and grafts: Secondary | ICD-10-CM

## 2017-05-03 DIAGNOSIS — E039 Hypothyroidism, unspecified: Secondary | ICD-10-CM | POA: Insufficient documentation

## 2017-05-03 DIAGNOSIS — I453 Trifascicular block: Secondary | ICD-10-CM | POA: Insufficient documentation

## 2017-05-03 DIAGNOSIS — Z882 Allergy status to sulfonamides status: Secondary | ICD-10-CM | POA: Insufficient documentation

## 2017-05-03 DIAGNOSIS — R55 Syncope and collapse: Secondary | ICD-10-CM | POA: Diagnosis present

## 2017-05-03 DIAGNOSIS — Z950 Presence of cardiac pacemaker: Secondary | ICD-10-CM

## 2017-05-03 HISTORY — PX: PACEMAKER IMPLANT: EP1218

## 2017-05-03 HISTORY — DX: Presence of cardiac pacemaker: Z95.0

## 2017-05-03 HISTORY — DX: Syncope and collapse: R55

## 2017-05-03 LAB — SURGICAL PCR SCREEN
MRSA, PCR: NEGATIVE
STAPHYLOCOCCUS AUREUS: NEGATIVE

## 2017-05-03 SURGERY — PACEMAKER IMPLANT

## 2017-05-03 MED ORDER — LEVOTHYROXINE SODIUM 75 MCG PO TABS
75.0000 ug | ORAL_TABLET | Freq: Every day | ORAL | Status: DC
  Administered 2017-05-04: 75 ug via ORAL
  Filled 2017-05-03: qty 1

## 2017-05-03 MED ORDER — FAMOTIDINE 20 MG PO TABS
20.0000 mg | ORAL_TABLET | Freq: Every day | ORAL | Status: DC
Start: 2017-05-04 — End: 2017-05-04
  Administered 2017-05-03: 20 mg via ORAL
  Filled 2017-05-03 (×2): qty 1

## 2017-05-03 MED ORDER — SODIUM CHLORIDE 0.9 % IR SOLN
Status: AC
  Filled 2017-05-03: qty 2

## 2017-05-03 MED ORDER — ACETAMINOPHEN 325 MG PO TABS
325.0000 mg | ORAL_TABLET | ORAL | Status: DC | PRN
  Administered 2017-05-03: 650 mg via ORAL
  Filled 2017-05-03 (×2): qty 2

## 2017-05-03 MED ORDER — TRAMADOL HCL 50 MG PO TABS
50.0000 mg | ORAL_TABLET | Freq: Every day | ORAL | Status: DC
Start: 2017-05-04 — End: 2017-05-04
  Administered 2017-05-03 – 2017-05-04 (×2): 50 mg via ORAL
  Filled 2017-05-03 (×2): qty 1

## 2017-05-03 MED ORDER — MIDAZOLAM HCL 5 MG/5ML IJ SOLN
INTRAMUSCULAR | Status: AC
  Filled 2017-05-03: qty 5

## 2017-05-03 MED ORDER — MIDAZOLAM HCL 5 MG/5ML IJ SOLN
INTRAMUSCULAR | Status: DC | PRN
  Administered 2017-05-03 (×2): 1 mg via INTRAVENOUS

## 2017-05-03 MED ORDER — ONDANSETRON HCL 4 MG/2ML IJ SOLN: 4.0000 mg | Freq: Four times a day (QID) | INTRAMUSCULAR | Status: DC | PRN

## 2017-05-03 MED ORDER — CALCIUM CARBONATE-VITAMIN D 500-200 MG-UNIT PO TABS
1.0000 | ORAL_TABLET | Freq: Two times a day (BID) | ORAL | Status: DC
  Administered 2017-05-03 – 2017-05-04 (×2): 1 via ORAL
  Filled 2017-05-03 (×2): qty 1

## 2017-05-03 MED ORDER — FLUTICASONE PROPIONATE 50 MCG/ACT NA SUSP: 2.0000 | Freq: Every day | NASAL | Status: DC | PRN

## 2017-05-03 MED ORDER — SODIUM CHLORIDE 0.9 % IV SOLN
INTRAVENOUS | Status: DC
  Administered 2017-05-03: 11:00:00 via INTRAVENOUS

## 2017-05-03 MED ORDER — VANCOMYCIN HCL IN DEXTROSE 1-5 GM/200ML-% IV SOLN
INTRAVENOUS | Status: AC
  Filled 2017-05-03: qty 200

## 2017-05-03 MED ORDER — MUPIROCIN 2 % EX OINT
TOPICAL_OINTMENT | CUTANEOUS | Status: AC
  Filled 2017-05-03: qty 22

## 2017-05-03 MED ORDER — FENTANYL CITRATE (PF) 100 MCG/2ML IJ SOLN
INTRAMUSCULAR | Status: AC
  Filled 2017-05-03: qty 2

## 2017-05-03 MED ORDER — VANCOMYCIN HCL IN DEXTROSE 1-5 GM/200ML-% IV SOLN
1000.0000 mg | Freq: Two times a day (BID) | INTRAVENOUS | Status: AC
  Administered 2017-05-03: 1000 mg via INTRAVENOUS
  Filled 2017-05-03: qty 200

## 2017-05-03 MED ORDER — SERTRALINE HCL 100 MG PO TABS
100.0000 mg | ORAL_TABLET | Freq: Every day | ORAL | Status: DC
  Administered 2017-05-03 – 2017-05-04 (×2): 100 mg via ORAL
  Filled 2017-05-03 (×2): qty 1

## 2017-05-03 MED ORDER — VANCOMYCIN HCL IN DEXTROSE 1-5 GM/200ML-% IV SOLN
1000.0000 mg | INTRAVENOUS | Status: AC
  Administered 2017-05-03: 1000 mg via INTRAVENOUS

## 2017-05-03 MED ORDER — FENTANYL CITRATE (PF) 100 MCG/2ML IJ SOLN
INTRAMUSCULAR | Status: DC | PRN
  Administered 2017-05-03: 25 ug via INTRAVENOUS

## 2017-05-03 MED ORDER — HEPARIN (PORCINE) IN NACL 2-0.9 UNIT/ML-% IJ SOLN
INTRAMUSCULAR | Status: AC | PRN
  Administered 2017-05-03: 500 mL

## 2017-05-03 MED ORDER — LIDOCAINE HCL (PF) 1 % IJ SOLN
INTRAMUSCULAR | Status: DC | PRN
  Administered 2017-05-03: 35 mL via SUBCUTANEOUS

## 2017-05-03 MED ORDER — ADULT MULTIVITAMIN W/MINERALS CH
1.0000 | ORAL_TABLET | Freq: Every day | ORAL | Status: DC
  Administered 2017-05-04: 1 via ORAL
  Filled 2017-05-03 (×2): qty 1

## 2017-05-03 MED ORDER — LIDOCAINE HCL (PF) 1 % IJ SOLN
INTRAMUSCULAR | Status: AC
  Filled 2017-05-03: qty 60

## 2017-05-03 MED ORDER — SALINE SPRAY 0.65 % NA SOLN
1.0000 | Freq: Every day | NASAL | Status: DC | PRN
  Filled 2017-05-03: qty 44

## 2017-05-03 MED ORDER — PRAVASTATIN SODIUM 40 MG PO TABS
80.0000 mg | ORAL_TABLET | Freq: Every day | ORAL | Status: DC
  Administered 2017-05-03: 80 mg via ORAL
  Filled 2017-05-03: qty 2

## 2017-05-03 MED ORDER — MULTIVITAMINS PO CAPS: 1.0000 | ORAL_CAPSULE | Freq: Every day | ORAL | Status: DC

## 2017-05-03 MED ORDER — SODIUM CHLORIDE 0.9 % IR SOLN
80.0000 mg | Status: AC
  Administered 2017-05-03: 80 mg

## 2017-05-03 MED ORDER — PANTOPRAZOLE SODIUM 40 MG PO TBEC
80.0000 mg | DELAYED_RELEASE_TABLET | Freq: Every day | ORAL | Status: DC
  Administered 2017-05-04: 80 mg via ORAL
  Filled 2017-05-03 (×2): qty 2

## 2017-05-03 MED ORDER — MUPIROCIN 2 % EX OINT
1.0000 "application " | TOPICAL_OINTMENT | Freq: Once | CUTANEOUS | Status: DC
  Administered 2017-05-03: 1 via TOPICAL

## 2017-05-03 SURGICAL SUPPLY — 8 items
CABLE SURGICAL S-101-97-12 (CABLE) ×2 IMPLANT
KIT MICROINTRODUCER STIFF 5F (SHEATH) ×2 IMPLANT
LEAD TENDRIL MRI 46CM LPA1200M (Lead) ×2 IMPLANT
LEAD TENDRIL MRI 52CM LPA1200M (Lead) ×2 IMPLANT
PACEMAKER ASSURITY DR-RF (Pacemaker) ×2 IMPLANT
PAD DEFIB LIFELINK (PAD) ×2 IMPLANT
SHEATH CLASSIC 8F (SHEATH) ×4 IMPLANT
TRAY PACEMAKER INSERTION (PACKS) ×2 IMPLANT

## 2017-05-03 NOTE — Discharge Summary (Addendum)
ELECTROPHYSIOLOGY PROCEDURE DISCHARGE SUMMARY    Patient ID: Julia Hunt,  MRN: [id number], DOB/AGE: 06/12/1927 84 y.o.  Admit date: 05/03/2017 Discharge date: 05/04/17  Primary Care Physician: Nicoletta Dress, MD  Primary Cardiologist: Dr. Bettina Gavia Electrophysiologist: [facility]  Primary Discharge Diagnosis:  1. Syncope 2. Trifascicular block  Secondary Discharge Diagnosis:  1.  HLD 2. Hypothyroidism  Allergies  Allergen Reactions  . Clarithromycin Rash  . Penicillins Rash    Has patient had a PCN reaction causing immediate rash, facial/tongue/throat swelling, SOB or lightheadedness with hypotension: Unknown Has patient had a PCN reaction causing severe rash involving mucus membranes or skin necrosis: Unknown Has patient had a PCN reaction that required hospitalization: No Has patient had a PCN reaction occurring within the last 10 years: No If all of the above answers are "NO", then may proceed with Cephalosporin use.   . Sulfa Antibiotics Rash     Procedures This Admission:  1.  Implantation of a SJM dual chamber PPM on 05/03/17 by Dr Curt Bears.  The patient received [facility] model [id number] (serial number  [id number]) right atrial lead and a St Jude Medical model [id number] (serial number  [id number]) right ventricular leadSt Jude Medical Assurity MRI  model [id number] (serial number  [id number] ) pacemaker  There were no immediate post procedure complications. 2.  CXR on 05/04/17 demonstrated no pneumothorax status post device implantation.   Brief HPI: Julia Hunt is a 84 y.o. female was referred to electrophysiology in the outpatient setting for consideration of PPM implantation.  Past medical history includes syncope, conduction system disease, HLD, hypothyroidism.  The patient has had symptomatic bradycardia without reversible causes identified.  Risks, benefits, and alternatives to PPM implantation were reviewed with the patient who wished to proceed.    Hospital Course:  The patient was admitted and underwent implantation of a PPM with details as outlined above.  She was monitored on telemetry overnight which demonstrated SR, intermittent AP.  Left chest was without hematoma or ecchymosis.  The device was interrogated and found to be functioning normally.  CXR was obtained and demonstrated no pneumothorax status post device implantation.  Wound care, arm mobility, and restrictions were reviewed with the patient.  The patient was examined by Dr. Curt Bears and considered stable for discharge to home.    Physical Exam: Vitals:   05/03/17 2022 05/04/17 0033 05/04/17 0516 05/04/17 0739  BP: (!) 137/53 125/60 135/64 (!) 147/69  Pulse: 64 62 66 63  Resp: 18 18 16 18   Temp: 98.2 F (36.8 C) 98 F (36.7 C) 98 F (36.7 C) 98.5 F (36.9 C)  TempSrc: Oral Oral Oral Oral  SpO2: 95% 100% 100% 94%  Weight:   115 lb 11.9 oz (52.5 kg)   Height:        GEN- The patient is well appearing, alert and oriented x 3 today.   HEENT: normocephalic, atraumatic; sclera clear, conjunctiva pink; hearing intact; oropharynx clear; neck supple, no JVP Lungs-  CTA b/l, normal work of breathing.  No wheezes, rales, rhonchi Heart- RRR, no murmurs, rubs or gallops, PMI not laterally displaced GI- soft, non-tender, non-distended Extremities- no clubbing, cyanosis, or edema MS- no significant deformity, age appropriate atrophy Skin- warm and dry, no rash or lesion,  left chest without hematoma/ecchymosis Psych- euthymic mood, full affect Neuro- no gross deficits   Labs:   Lab Results  Component Value Date   WBC 6.7 04/27/2017   HGB 12.2 04/27/2017   HCT 38.8 04/27/2017  MCV 83 04/27/2017   PLT 230 04/27/2017    Recent Labs  Lab 04/27/17 1647  NA 145*  K 4.0  CL 107*  CO2 25  BUN 23  CREATININE 1.02*  CALCIUM 9.0  GLUCOSE 98    Discharge Medications:  Allergies as of 05/04/2017      Reactions   Clarithromycin Rash   Penicillins Rash   Has  patient had a PCN reaction causing immediate rash, facial/tongue/throat swelling, SOB or lightheadedness with hypotension: Unknown Has patient had a PCN reaction causing severe rash involving mucus membranes or skin necrosis: Unknown Has patient had a PCN reaction that required hospitalization: No Has patient had a PCN reaction occurring within the last 10 years: No If all of the above answers are "NO", then may proceed with Cephalosporin use.   Sulfa Antibiotics Rash      Medication List    TAKE these medications   CALCIUM 600+D 600-400 MG-UNIT tablet Generic drug:  Calcium Carbonate-Vitamin D Take 1 tablet by mouth 2 (two) times daily.   fluticasone 50 MCG/ACT nasal spray Commonly known as:  FLONASE Place 2 sprays into both nostrils daily as needed for allergies or rhinitis.   levothyroxine 75 MCG tablet Commonly known as:  SYNTHROID, LEVOTHROID Take 75 mcg by mouth daily before breakfast.   multivitamin capsule Take 1 capsule by mouth daily.   omeprazole 20 MG capsule Commonly known as:  PRILOSEC Take 20 mg by mouth 2 (two) times daily as needed (acid reflux).   pravastatin 80 MG tablet Commonly known as:  PRAVACHOL Take 80 mg by mouth at bedtime.   ranitidine 150 MG tablet Commonly known as:  ZANTAC Take 150 mg by mouth daily as needed for heartburn.   sertraline 100 MG tablet Commonly known as:  ZOLOFT Take 100 mg by mouth daily.   sodium chloride 0.65 % Soln nasal spray Commonly known as:  OCEAN Place 1 spray into both nostrils daily as needed for congestion.   traMADol 50 MG tablet Commonly known as:  ULTRAM Take 50 mg by mouth daily.       Disposition:  Home  Discharge Instructions    Diet - low sodium heart healthy   Complete by:  As directed    Increase activity slowly   Complete by:  As directed      Follow-up Information    [facility] Follow up on 05/16/2017.   Specialty:  Cardiology Why:  3:00PM, wound check  visit Contact information: [address] [place]       Constance Haw, MD Follow up on 08/01/2017.   Specialty:  Cardiology Why:  11:00AM Contact information: [place] [address] [phone]           Duration of Discharge Encounter: Greater than 30 minutes including physician time.  Venetia Night, PA-C 05/04/2017 9:54 AM  I have seen and examined this patient with Tommye Standard.  Agree with above, note added to reflect my findings.  On exam, RRR, no murmurs, lungs clear.  St. Jude dual-chamber pacemaker implanted for syncope and trifascicular block.  Had some ventricular pacing overnight.  Otherwise chest x-ray and interrogation without abnormality.  Plan for discharge today with follow-up in device clinic.  Almeda Ezra M. Nohealani Medinger MD 05/04/2017 1:46 PM

## 2017-05-03 NOTE — Discharge Instructions (Signed)
° ° °  Supplemental Discharge Instructions for  Pacemaker/Defibrillator Patients  Activity No heavy lifting or vigorous activity with your left/right arm for 6 to 8 weeks.  Do not raise your left/right arm above your head for one week.  Gradually raise your affected arm as drawn below.             05/07/17                        05/08/17                   05/09/17                   05/10/17 __  NO DRIVING (the patient no longer drives).  WOUND CARE - Keep the wound area clean and dry.  Do not get this area wet, no showers until cleared to at your wound check visit - The tape/steri-strips on your wound will fall off; do not pull them off.  No bandage is needed on the site.  DO  NOT apply any creams, oils, or ointments to the wound area. - If you notice any drainage or discharge from the wound, any swelling or bruising at the site, or you develop a fever > 101? F after you are discharged home, call the office at once.  Special Instructions - You are still able to use cellular telephones; use the ear opposite the side where you have your pacemaker/defibrillator.  Avoid carrying your cellular phone near your device. - When traveling through airports, show security personnel your identification card to avoid being screened in the metal detectors.  Ask the security personnel to use the hand wand. - Avoid arc welding equipment, MRI testing (magnetic resonance imaging), TENS units (transcutaneous nerve stimulators).  Call the office for questions about other devices. - Avoid electrical appliances that are in poor condition or are not properly grounded. - Microwave ovens are safe to be near or to operate.  Additional information for defibrillator patients should your device go off: - If your device goes off ONCE and you feel fine afterward, notify the device clinic nurses. - If your device goes off ONCE and you do not feel well afterward, call 911. - If your device goes off TWICE, call 911. - If your  device goes off THREE times in one day, call 911.  DO NOT DRIVE YOURSELF OR A FAMILY MEMBER WITH A DEFIBRILLATOR TO THE HOSPITAL--CALL 911.

## 2017-05-03 NOTE — H&P (Signed)
Julia Hunt has presented today for surgery, with the diagnosis of syncope, trifascicular block.  The various methods of treatment have been discussed with the patient and family. After consideration of risks, benefits and other options for treatment, the patient has consented to  Procedure(s): pacemaker as a surgical intervention .  Risks include but not limited to bleeding, tamponade, infection, pneumothorax, among others. The patient's history has been reviewed, patient examined, no change in status, stable for surgery.  I have reviewed the patient's chart and labs.  Questions were answered to the patient's satisfaction.    Gracelin Weisberg Curt Bears, MD 05/03/2017 10:16 AM

## 2017-05-04 ENCOUNTER — Other Ambulatory Visit: Payer: Self-pay

## 2017-05-04 ENCOUNTER — Ambulatory Visit (HOSPITAL_COMMUNITY): Payer: Medicare Other

## 2017-05-04 ENCOUNTER — Encounter (HOSPITAL_COMMUNITY): Payer: Self-pay | Admitting: General Practice

## 2017-05-04 DIAGNOSIS — Z882 Allergy status to sulfonamides status: Secondary | ICD-10-CM | POA: Diagnosis not present

## 2017-05-04 DIAGNOSIS — E039 Hypothyroidism, unspecified: Secondary | ICD-10-CM | POA: Diagnosis not present

## 2017-05-04 DIAGNOSIS — R55 Syncope and collapse: Secondary | ICD-10-CM | POA: Diagnosis not present

## 2017-05-04 DIAGNOSIS — E785 Hyperlipidemia, unspecified: Secondary | ICD-10-CM | POA: Diagnosis not present

## 2017-05-04 DIAGNOSIS — I453 Trifascicular block: Secondary | ICD-10-CM | POA: Diagnosis not present

## 2017-05-04 DIAGNOSIS — Z950 Presence of cardiac pacemaker: Secondary | ICD-10-CM | POA: Diagnosis not present

## 2017-05-04 DIAGNOSIS — Z880 Allergy status to penicillin: Secondary | ICD-10-CM | POA: Diagnosis not present

## 2017-05-04 NOTE — Plan of Care (Signed)
  Education: Knowledge of General Education information will improve 05/04/2017 0158 - Progressing by Theador Hawthorne, RN   Coping: Level of anxiety will decrease 05/04/2017 0158 - Progressing by Theador Hawthorne, RN   Pain Managment: General experience of comfort will improve 05/04/2017 0158 - Progressing by Theador Hawthorne, RN   Education: Ability to safely manage health related needs after discharge will improve 05/04/2017 0158 - Progressing by Theador Hawthorne, RN

## 2017-05-04 NOTE — Progress Notes (Signed)
Patient discharged home with sons, IV out Tele off and paper work signed. Vouleteer services accompany patient and family.

## 2017-05-16 ENCOUNTER — Ambulatory Visit (INDEPENDENT_AMBULATORY_CARE_PROVIDER_SITE_OTHER): Payer: Medicare Other | Admitting: *Deleted

## 2017-05-16 DIAGNOSIS — R55 Syncope and collapse: Secondary | ICD-10-CM | POA: Diagnosis not present

## 2017-05-16 LAB — CUP PACEART INCLINIC DEVICE CHECK
Battery Remaining Longevity: 98 mo
Battery Voltage: 3.1 V
Brady Statistic RA Percent Paced: 45 %
Date Time Interrogation Session: 20190218152941
Implantable Lead Implant Date: 20190205
Implantable Lead Location: 753860
Lead Channel Pacing Threshold Amplitude: 0.75 V
Lead Channel Pacing Threshold Amplitude: 0.75 V
Lead Channel Pacing Threshold Amplitude: 0.75 V
Lead Channel Pacing Threshold Pulse Width: 0.5 ms
Lead Channel Pacing Threshold Pulse Width: 0.5 ms
Lead Channel Pacing Threshold Pulse Width: 0.5 ms
Lead Channel Sensing Intrinsic Amplitude: 3 mV
Lead Channel Sensing Intrinsic Amplitude: 6.8 mV
MDC IDC LEAD IMPLANT DT: 20190205
MDC IDC LEAD LOCATION: 753859
MDC IDC MSMT LEADCHNL RA IMPEDANCE VALUE: 375 Ohm
MDC IDC MSMT LEADCHNL RV IMPEDANCE VALUE: 587.5 Ohm
MDC IDC MSMT LEADCHNL RV PACING THRESHOLD AMPLITUDE: 0.75 V
MDC IDC MSMT LEADCHNL RV PACING THRESHOLD PULSEWIDTH: 0.5 ms
MDC IDC PG IMPLANT DT: 20190205
MDC IDC SET LEADCHNL RA PACING AMPLITUDE: 3.5 V
MDC IDC SET LEADCHNL RV PACING AMPLITUDE: 1 V
MDC IDC SET LEADCHNL RV PACING PULSEWIDTH: 0.5 ms
MDC IDC SET LEADCHNL RV SENSING SENSITIVITY: 2 mV
MDC IDC STAT BRADY RV PERCENT PACED: 7.4 %
Pulse Gen Model: 2272
Pulse Gen Serial Number: 8993229

## 2017-05-16 NOTE — Progress Notes (Signed)
Wound check appointment. Steri-strips removed. Wound without redness or edema. Incision edges approximated, wound well healed. Normal device function. Thresholds, sensing, and impedances consistent with implant measurements. Device programmed at 3.5V/auto capture programmed on for extra safety margin until 3 month visit. Histogram distribution appropriate for patient and level of activity. No mode switches or high ventricular rates noted. Patient educated about wound care, arm mobility, lifting restrictions. ROV 08/01/2017 w/ WC.

## 2017-06-23 ENCOUNTER — Encounter: Payer: Self-pay | Admitting: Cardiology

## 2017-07-21 DIAGNOSIS — K219 Gastro-esophageal reflux disease without esophagitis: Secondary | ICD-10-CM | POA: Diagnosis not present

## 2017-07-21 DIAGNOSIS — E039 Hypothyroidism, unspecified: Secondary | ICD-10-CM | POA: Diagnosis not present

## 2017-07-21 DIAGNOSIS — E785 Hyperlipidemia, unspecified: Secondary | ICD-10-CM | POA: Diagnosis not present

## 2017-07-21 DIAGNOSIS — D509 Iron deficiency anemia, unspecified: Secondary | ICD-10-CM | POA: Diagnosis not present

## 2017-07-22 DIAGNOSIS — E039 Hypothyroidism, unspecified: Secondary | ICD-10-CM | POA: Diagnosis not present

## 2017-07-22 DIAGNOSIS — D509 Iron deficiency anemia, unspecified: Secondary | ICD-10-CM | POA: Diagnosis not present

## 2017-07-22 DIAGNOSIS — E785 Hyperlipidemia, unspecified: Secondary | ICD-10-CM | POA: Diagnosis not present

## 2017-08-01 ENCOUNTER — Encounter: Payer: Self-pay | Admitting: Cardiology

## 2017-08-10 DIAGNOSIS — S8001XA Contusion of right knee, initial encounter: Secondary | ICD-10-CM | POA: Diagnosis not present

## 2017-08-10 DIAGNOSIS — M1711 Unilateral primary osteoarthritis, right knee: Secondary | ICD-10-CM | POA: Diagnosis not present

## 2017-08-16 ENCOUNTER — Encounter: Payer: Medicare Other | Admitting: Cardiology
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
## 2017-08-24 DIAGNOSIS — J069 Acute upper respiratory infection, unspecified: Secondary | ICD-10-CM | POA: Diagnosis not present

## 2017-08-24 DIAGNOSIS — J029 Acute pharyngitis, unspecified: Secondary | ICD-10-CM | POA: Diagnosis not present

## 2017-08-29 ENCOUNTER — Encounter: Payer: Self-pay | Admitting: Cardiology

## 2017-08-29 ENCOUNTER — Ambulatory Visit (INDEPENDENT_AMBULATORY_CARE_PROVIDER_SITE_OTHER): Payer: Medicare Other | Admitting: Cardiology

## 2017-08-29 VITALS — BP 126/78 | HR 73 | Ht 60.0 in | Wt 115.6 lb

## 2017-08-29 DIAGNOSIS — S91001A Unspecified open wound, right ankle, initial encounter: Secondary | ICD-10-CM | POA: Diagnosis not present

## 2017-08-29 DIAGNOSIS — R55 Syncope and collapse: Secondary | ICD-10-CM

## 2017-08-29 DIAGNOSIS — I452 Bifascicular block: Secondary | ICD-10-CM

## 2017-08-29 DIAGNOSIS — I10 Essential (primary) hypertension: Secondary | ICD-10-CM

## 2017-08-29 DIAGNOSIS — R0902 Hypoxemia: Secondary | ICD-10-CM | POA: Diagnosis not present

## 2017-08-29 DIAGNOSIS — R58 Hemorrhage, not elsewhere classified: Secondary | ICD-10-CM | POA: Diagnosis not present

## 2017-08-29 DIAGNOSIS — S91051A Open bite, right ankle, initial encounter: Secondary | ICD-10-CM | POA: Diagnosis not present

## 2017-08-29 LAB — CUP PACEART INCLINIC DEVICE CHECK
Battery Remaining Longevity: 123 mo
Battery Voltage: 3.01 V
Brady Statistic RA Percent Paced: 43 %
Brady Statistic RV Percent Paced: 17 %
Implantable Lead Implant Date: 20190205
Implantable Lead Location: 753860
Lead Channel Impedance Value: 562.5 Ohm
Lead Channel Pacing Threshold Amplitude: 0.5 V
Lead Channel Pacing Threshold Pulse Width: 0.5 ms
Lead Channel Sensing Intrinsic Amplitude: 8.1 mV
Lead Channel Setting Pacing Amplitude: 2 V
Lead Channel Setting Sensing Sensitivity: 2 mV
MDC IDC LEAD IMPLANT DT: 20190205
MDC IDC LEAD LOCATION: 753859
MDC IDC MSMT LEADCHNL RA IMPEDANCE VALUE: 362.5 Ohm
MDC IDC MSMT LEADCHNL RA PACING THRESHOLD AMPLITUDE: 0.5 V
MDC IDC MSMT LEADCHNL RA PACING THRESHOLD PULSEWIDTH: 0.5 ms
MDC IDC MSMT LEADCHNL RA SENSING INTR AMPL: 2 mV
MDC IDC MSMT LEADCHNL RV PACING THRESHOLD AMPLITUDE: 0.5 V
MDC IDC MSMT LEADCHNL RV PACING THRESHOLD AMPLITUDE: 0.5 V
MDC IDC MSMT LEADCHNL RV PACING THRESHOLD PULSEWIDTH: 0.5 ms
MDC IDC MSMT LEADCHNL RV PACING THRESHOLD PULSEWIDTH: 0.5 ms
MDC IDC PG IMPLANT DT: 20190205
MDC IDC PG SERIAL: 8993229
MDC IDC SESS DTM: 20190603161258
MDC IDC SET LEADCHNL RV PACING AMPLITUDE: 0.75 V
MDC IDC SET LEADCHNL RV PACING PULSEWIDTH: 0.5 ms
Pulse Gen Model: 2272

## 2017-08-29 NOTE — Patient Instructions (Signed)
Medication Instructions:  Your physician recommends that you continue on your current medications as directed. Please refer to the Current Medication list given to you today.  *If you need a refill on your cardiac medications before your next appointment, please call your pharmacy*  Labwork: None ordered  Testing/Procedures: None ordered  Follow-Up: Remote monitoring is used to monitor your Pacemaker or ICD from home. This monitoring reduces the number of office visits required to check your device to one time per year. It allows [place] to keep an eye on the functioning of your device to ensure it is working properly. You are scheduled for a device check from home on 11/29/2017. You may send your transmission at any time that day. If you have a wireless device, the transmission will be sent automatically. After your physician reviews your transmission, you will receive a postcard with your next transmission date.  Your physician wants you to follow-up in: 9 months with Dr. Curt Bears.  You will receive a reminder letter in the mail two months in advance. If you don't receive a letter, please call our office to schedule the follow-up appointment.  Thank you for choosing [facility]!!   Trinidad Curet, RN [phone]  Any Other Special Instructions Will Be Listed Below (If Applicable).
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
## 2017-09-01 DIAGNOSIS — S91011A Laceration without foreign body, right ankle, initial encounter: Secondary | ICD-10-CM | POA: Diagnosis not present

## 2017-09-01 DIAGNOSIS — Z23 Encounter for immunization: Secondary | ICD-10-CM | POA: Diagnosis not present

## 2017-09-01 DIAGNOSIS — M5137 Other intervertebral disc degeneration, lumbosacral region: Secondary | ICD-10-CM | POA: Diagnosis not present

## 2017-09-01 DIAGNOSIS — S91351A Open bite, right foot, initial encounter: Secondary | ICD-10-CM | POA: Diagnosis not present

## 2017-09-09 DIAGNOSIS — Z8781 Personal history of (healed) traumatic fracture: Secondary | ICD-10-CM | POA: Diagnosis not present

## 2017-09-09 DIAGNOSIS — S32000A Wedge compression fracture of unspecified lumbar vertebra, initial encounter for closed fracture: Secondary | ICD-10-CM | POA: Diagnosis not present

## 2017-09-09 DIAGNOSIS — M5137 Other intervertebral disc degeneration, lumbosacral region: Secondary | ICD-10-CM | POA: Diagnosis not present

## 2017-09-10 DIAGNOSIS — S91051A Open bite, right ankle, initial encounter: Secondary | ICD-10-CM | POA: Diagnosis not present

## 2017-09-10 DIAGNOSIS — L03115 Cellulitis of right lower limb: Secondary | ICD-10-CM | POA: Diagnosis not present

## 2017-09-14 DIAGNOSIS — S32000A Wedge compression fracture of unspecified lumbar vertebra, initial encounter for closed fracture: Secondary | ICD-10-CM | POA: Diagnosis not present

## 2017-09-16 DIAGNOSIS — Z6821 Body mass index (BMI) 21.0-21.9, adult: Secondary | ICD-10-CM | POA: Diagnosis not present

## 2017-09-16 DIAGNOSIS — Z1339 Encounter for screening examination for other mental health and behavioral disorders: Secondary | ICD-10-CM | POA: Diagnosis not present

## 2017-09-16 DIAGNOSIS — M25552 Pain in left hip: Secondary | ICD-10-CM | POA: Diagnosis not present

## 2017-09-16 DIAGNOSIS — R300 Dysuria: Secondary | ICD-10-CM | POA: Diagnosis not present

## 2017-09-16 DIAGNOSIS — S32020A Wedge compression fracture of second lumbar vertebra, initial encounter for closed fracture: Secondary | ICD-10-CM | POA: Diagnosis not present

## 2017-09-19 DIAGNOSIS — M25552 Pain in left hip: Secondary | ICD-10-CM | POA: Diagnosis not present

## 2017-09-19 DIAGNOSIS — M545 Low back pain: Secondary | ICD-10-CM | POA: Diagnosis not present

## 2017-09-19 DIAGNOSIS — S32000A Wedge compression fracture of unspecified lumbar vertebra, initial encounter for closed fracture: Secondary | ICD-10-CM | POA: Diagnosis not present

## 2017-09-20 DIAGNOSIS — R1310 Dysphagia, unspecified: Secondary | ICD-10-CM | POA: Diagnosis not present

## 2017-09-20 DIAGNOSIS — R1013 Epigastric pain: Secondary | ICD-10-CM | POA: Diagnosis not present

## 2017-09-26 DIAGNOSIS — E7800 Pure hypercholesterolemia, unspecified: Secondary | ICD-10-CM | POA: Diagnosis not present

## 2017-09-26 DIAGNOSIS — Z950 Presence of cardiac pacemaker: Secondary | ICD-10-CM | POA: Diagnosis not present

## 2017-09-26 DIAGNOSIS — M1990 Unspecified osteoarthritis, unspecified site: Secondary | ICD-10-CM | POA: Diagnosis not present

## 2017-09-26 DIAGNOSIS — S32020A Wedge compression fracture of second lumbar vertebra, initial encounter for closed fracture: Secondary | ICD-10-CM | POA: Diagnosis not present

## 2017-09-26 DIAGNOSIS — M479 Spondylosis, unspecified: Secondary | ICD-10-CM | POA: Diagnosis not present

## 2017-09-26 DIAGNOSIS — Z79899 Other long term (current) drug therapy: Secondary | ICD-10-CM | POA: Diagnosis not present

## 2017-09-26 DIAGNOSIS — K589 Irritable bowel syndrome without diarrhea: Secondary | ICD-10-CM | POA: Diagnosis not present

## 2017-09-26 DIAGNOSIS — Z01818 Encounter for other preprocedural examination: Secondary | ICD-10-CM | POA: Diagnosis not present

## 2017-09-26 DIAGNOSIS — Z79891 Long term (current) use of opiate analgesic: Secondary | ICD-10-CM | POA: Diagnosis not present

## 2017-09-26 DIAGNOSIS — K219 Gastro-esophageal reflux disease without esophagitis: Secondary | ICD-10-CM | POA: Diagnosis not present

## 2017-09-26 DIAGNOSIS — E039 Hypothyroidism, unspecified: Secondary | ICD-10-CM | POA: Diagnosis not present

## 2017-09-26 DIAGNOSIS — Z8673 Personal history of transient ischemic attack (TIA), and cerebral infarction without residual deficits: Secondary | ICD-10-CM | POA: Diagnosis not present

## 2017-10-21 DIAGNOSIS — E785 Hyperlipidemia, unspecified: Secondary | ICD-10-CM | POA: Diagnosis not present

## 2017-10-21 DIAGNOSIS — D509 Iron deficiency anemia, unspecified: Secondary | ICD-10-CM | POA: Diagnosis not present

## 2017-10-21 DIAGNOSIS — E039 Hypothyroidism, unspecified: Secondary | ICD-10-CM | POA: Diagnosis not present

## 2017-11-07 DIAGNOSIS — K5900 Constipation, unspecified: Secondary | ICD-10-CM | POA: Diagnosis not present

## 2017-11-07 DIAGNOSIS — R110 Nausea: Secondary | ICD-10-CM | POA: Diagnosis not present

## 2017-11-07 DIAGNOSIS — R109 Unspecified abdominal pain: Secondary | ICD-10-CM | POA: Diagnosis not present

## 2017-11-08 DIAGNOSIS — S22080A Wedge compression fracture of T11-T12 vertebra, initial encounter for closed fracture: Secondary | ICD-10-CM | POA: Diagnosis not present

## 2017-11-08 DIAGNOSIS — S32010A Wedge compression fracture of first lumbar vertebra, initial encounter for closed fracture: Secondary | ICD-10-CM | POA: Diagnosis not present

## 2017-11-08 DIAGNOSIS — K5900 Constipation, unspecified: Secondary | ICD-10-CM | POA: Diagnosis not present

## 2017-11-14 DIAGNOSIS — K5900 Constipation, unspecified: Secondary | ICD-10-CM | POA: Diagnosis not present

## 2017-11-14 DIAGNOSIS — S22080A Wedge compression fracture of T11-T12 vertebra, initial encounter for closed fracture: Secondary | ICD-10-CM | POA: Diagnosis not present

## 2017-11-16 DIAGNOSIS — S22080A Wedge compression fracture of T11-T12 vertebra, initial encounter for closed fracture: Secondary | ICD-10-CM | POA: Diagnosis not present

## 2017-11-21 DIAGNOSIS — D509 Iron deficiency anemia, unspecified: Secondary | ICD-10-CM | POA: Diagnosis not present

## 2017-11-21 DIAGNOSIS — M1990 Unspecified osteoarthritis, unspecified site: Secondary | ICD-10-CM | POA: Diagnosis not present

## 2017-11-21 DIAGNOSIS — I10 Essential (primary) hypertension: Secondary | ICD-10-CM | POA: Diagnosis not present

## 2017-11-21 DIAGNOSIS — Z950 Presence of cardiac pacemaker: Secondary | ICD-10-CM | POA: Diagnosis not present

## 2017-11-21 DIAGNOSIS — Z8673 Personal history of transient ischemic attack (TIA), and cerebral infarction without residual deficits: Secondary | ICD-10-CM | POA: Diagnosis not present

## 2017-11-21 DIAGNOSIS — Z79899 Other long term (current) drug therapy: Secondary | ICD-10-CM | POA: Diagnosis not present

## 2017-11-21 DIAGNOSIS — S32010A Wedge compression fracture of first lumbar vertebra, initial encounter for closed fracture: Secondary | ICD-10-CM | POA: Diagnosis not present

## 2017-11-21 DIAGNOSIS — S22080A Wedge compression fracture of T11-T12 vertebra, initial encounter for closed fracture: Secondary | ICD-10-CM | POA: Diagnosis not present

## 2017-11-21 DIAGNOSIS — E7800 Pure hypercholesterolemia, unspecified: Secondary | ICD-10-CM | POA: Diagnosis not present

## 2017-11-21 DIAGNOSIS — E039 Hypothyroidism, unspecified: Secondary | ICD-10-CM | POA: Diagnosis not present

## 2017-11-21 DIAGNOSIS — I452 Bifascicular block: Secondary | ICD-10-CM | POA: Diagnosis not present

## 2017-11-21 DIAGNOSIS — K219 Gastro-esophageal reflux disease without esophagitis: Secondary | ICD-10-CM | POA: Diagnosis not present

## 2017-11-29 ENCOUNTER — Ambulatory Visit (INDEPENDENT_AMBULATORY_CARE_PROVIDER_SITE_OTHER): Payer: Medicare Other | Admitting: *Deleted

## 2017-11-29 DIAGNOSIS — R55 Syncope and collapse: Secondary | ICD-10-CM

## 2017-11-29 NOTE — Progress Notes (Signed)
Remote pacemaker transmission.   

## 2017-12-21 DIAGNOSIS — R928 Other abnormal and inconclusive findings on diagnostic imaging of breast: Secondary | ICD-10-CM | POA: Diagnosis not present

## 2017-12-21 DIAGNOSIS — N6320 Unspecified lump in the left breast, unspecified quadrant: Secondary | ICD-10-CM | POA: Diagnosis not present

## 2017-12-21 DIAGNOSIS — N6310 Unspecified lump in the right breast, unspecified quadrant: Secondary | ICD-10-CM | POA: Diagnosis not present

## 2017-12-22 DIAGNOSIS — N6312 Unspecified lump in the right breast, upper inner quadrant: Secondary | ICD-10-CM | POA: Diagnosis not present

## 2017-12-22 DIAGNOSIS — N6322 Unspecified lump in the left breast, upper inner quadrant: Secondary | ICD-10-CM | POA: Diagnosis not present

## 2017-12-22 DIAGNOSIS — N6320 Unspecified lump in the left breast, unspecified quadrant: Secondary | ICD-10-CM | POA: Diagnosis not present

## 2017-12-22 DIAGNOSIS — R928 Other abnormal and inconclusive findings on diagnostic imaging of breast: Secondary | ICD-10-CM | POA: Diagnosis not present

## 2017-12-22 DIAGNOSIS — N6310 Unspecified lump in the right breast, unspecified quadrant: Secondary | ICD-10-CM | POA: Diagnosis not present

## 2017-12-26 LAB — CUP PACEART REMOTE DEVICE CHECK
Battery Remaining Longevity: 123 mo
Battery Remaining Percentage: 95.5 %
Battery Voltage: 3.02 V
Brady Statistic AP VP Percent: 1 %
Brady Statistic AP VS Percent: 25 %
Brady Statistic AS VP Percent: 1 %
Brady Statistic AS VS Percent: 73 %
Brady Statistic RA Percent Paced: 26 %
Brady Statistic RV Percent Paced: 1.1 %
Date Time Interrogation Session: 20190903060024
Implantable Lead Implant Date: 20190205
Implantable Lead Implant Date: 20190205
Implantable Lead Location: 753859
Implantable Lead Location: 753860
Implantable Pulse Generator Implant Date: 20190205
Lead Channel Impedance Value: 390 Ohm
Lead Channel Impedance Value: 630 Ohm
Lead Channel Pacing Threshold Amplitude: 0.5 V
Lead Channel Pacing Threshold Amplitude: 0.5 V
Lead Channel Pacing Threshold Pulse Width: 0.5 ms
Lead Channel Pacing Threshold Pulse Width: 0.5 ms
Lead Channel Sensing Intrinsic Amplitude: 2.4 mV
Lead Channel Sensing Intrinsic Amplitude: 6.8 mV
Lead Channel Setting Pacing Amplitude: 0.75 V
Lead Channel Setting Pacing Amplitude: 2 V
Lead Channel Setting Pacing Pulse Width: 0.5 ms
Lead Channel Setting Sensing Sensitivity: 2 mV
Pulse Gen Model: 2272
Pulse Gen Serial Number: 8993229

## 2018-01-10 DIAGNOSIS — M810 Age-related osteoporosis without current pathological fracture: Secondary | ICD-10-CM | POA: Diagnosis not present

## 2018-01-25 DIAGNOSIS — M65322 Trigger finger, left index finger: Secondary | ICD-10-CM | POA: Diagnosis not present

## 2018-01-25 DIAGNOSIS — M65332 Trigger finger, left middle finger: Secondary | ICD-10-CM | POA: Diagnosis not present

## 2018-01-31 DIAGNOSIS — E785 Hyperlipidemia, unspecified: Secondary | ICD-10-CM | POA: Diagnosis not present

## 2018-01-31 DIAGNOSIS — D509 Iron deficiency anemia, unspecified: Secondary | ICD-10-CM | POA: Diagnosis not present

## 2018-01-31 DIAGNOSIS — K219 Gastro-esophageal reflux disease without esophagitis: Secondary | ICD-10-CM | POA: Diagnosis not present

## 2018-01-31 DIAGNOSIS — Z23 Encounter for immunization: Secondary | ICD-10-CM | POA: Diagnosis not present

## 2018-01-31 DIAGNOSIS — E039 Hypothyroidism, unspecified: Secondary | ICD-10-CM | POA: Diagnosis not present

## 2018-02-28 ENCOUNTER — Ambulatory Visit (INDEPENDENT_AMBULATORY_CARE_PROVIDER_SITE_OTHER): Payer: Medicare Other

## 2018-02-28 DIAGNOSIS — R55 Syncope and collapse: Secondary | ICD-10-CM | POA: Diagnosis not present

## 2018-02-28 NOTE — Progress Notes (Signed)
Remote pacemaker transmission.   

## 2018-03-01 DIAGNOSIS — M65332 Trigger finger, left middle finger: Secondary | ICD-10-CM | POA: Diagnosis not present

## 2018-03-01 DIAGNOSIS — M65322 Trigger finger, left index finger: Secondary | ICD-10-CM | POA: Diagnosis not present

## 2018-03-15 NOTE — Progress Notes (Signed)
letter

## 2018-04-05 DIAGNOSIS — M65322 Trigger finger, left index finger: Secondary | ICD-10-CM | POA: Diagnosis not present

## 2018-04-05 DIAGNOSIS — M24541 Contracture, right hand: Secondary | ICD-10-CM | POA: Diagnosis not present

## 2018-04-11 DIAGNOSIS — M79644 Pain in right finger(s): Secondary | ICD-10-CM | POA: Diagnosis not present

## 2018-04-11 DIAGNOSIS — M25641 Stiffness of right hand, not elsewhere classified: Secondary | ICD-10-CM | POA: Diagnosis not present

## 2018-04-11 DIAGNOSIS — M6281 Muscle weakness (generalized): Secondary | ICD-10-CM | POA: Diagnosis not present

## 2018-04-11 DIAGNOSIS — M24541 Contracture, right hand: Secondary | ICD-10-CM | POA: Diagnosis not present

## 2018-04-18 DIAGNOSIS — M25641 Stiffness of right hand, not elsewhere classified: Secondary | ICD-10-CM | POA: Diagnosis not present

## 2018-04-18 DIAGNOSIS — M24541 Contracture, right hand: Secondary | ICD-10-CM | POA: Diagnosis not present

## 2018-04-18 DIAGNOSIS — M6281 Muscle weakness (generalized): Secondary | ICD-10-CM | POA: Diagnosis not present

## 2018-04-18 DIAGNOSIS — M79644 Pain in right finger(s): Secondary | ICD-10-CM | POA: Diagnosis not present

## 2018-04-20 LAB — CUP PACEART REMOTE DEVICE CHECK
Battery Remaining Percentage: 95.5 %
Battery Voltage: 3.01 V
Brady Statistic AP VP Percent: 1.6 %
Brady Statistic AS VS Percent: 72 %
Brady Statistic RA Percent Paced: 27 %
Brady Statistic RV Percent Paced: 2 %
Date Time Interrogation Session: 20191203094347
Implantable Lead Implant Date: 20190205
Implantable Lead Implant Date: 20190205
Implantable Lead Location: 753859
Implantable Lead Location: 753860
Implantable Pulse Generator Implant Date: 20190205
Lead Channel Pacing Threshold Amplitude: 0.5 V
Lead Channel Pacing Threshold Amplitude: 0.5 V
Lead Channel Pacing Threshold Pulse Width: 0.5 ms
Lead Channel Pacing Threshold Pulse Width: 0.5 ms
Lead Channel Sensing Intrinsic Amplitude: 2.1 mV
Lead Channel Setting Pacing Amplitude: 0.75 V
Lead Channel Setting Sensing Sensitivity: 2 mV
MDC IDC MSMT BATTERY REMAINING LONGEVITY: 121 mo
MDC IDC MSMT LEADCHNL RA IMPEDANCE VALUE: 390 Ohm
MDC IDC MSMT LEADCHNL RV IMPEDANCE VALUE: 560 Ohm
MDC IDC MSMT LEADCHNL RV SENSING INTR AMPL: 6.3 mV
MDC IDC SET LEADCHNL RA PACING AMPLITUDE: 2 V
MDC IDC SET LEADCHNL RV PACING PULSEWIDTH: 0.5 ms
MDC IDC STAT BRADY AP VS PERCENT: 26 %
MDC IDC STAT BRADY AS VP PERCENT: 1 %
Pulse Gen Model: 2272
Pulse Gen Serial Number: 8993229

## 2018-04-25 DIAGNOSIS — M25641 Stiffness of right hand, not elsewhere classified: Secondary | ICD-10-CM | POA: Diagnosis not present

## 2018-04-25 DIAGNOSIS — M79644 Pain in right finger(s): Secondary | ICD-10-CM | POA: Diagnosis not present

## 2018-04-25 DIAGNOSIS — M6281 Muscle weakness (generalized): Secondary | ICD-10-CM | POA: Diagnosis not present

## 2018-04-25 DIAGNOSIS — M24541 Contracture, right hand: Secondary | ICD-10-CM | POA: Diagnosis not present

## 2018-04-27 DIAGNOSIS — M79644 Pain in right finger(s): Secondary | ICD-10-CM | POA: Diagnosis not present

## 2018-04-27 DIAGNOSIS — M6281 Muscle weakness (generalized): Secondary | ICD-10-CM | POA: Diagnosis not present

## 2018-04-27 DIAGNOSIS — M24541 Contracture, right hand: Secondary | ICD-10-CM | POA: Diagnosis not present

## 2018-04-27 DIAGNOSIS — M25641 Stiffness of right hand, not elsewhere classified: Secondary | ICD-10-CM | POA: Diagnosis not present

## 2018-05-02 DIAGNOSIS — M79644 Pain in right finger(s): Secondary | ICD-10-CM | POA: Diagnosis not present

## 2018-05-02 DIAGNOSIS — M24541 Contracture, right hand: Secondary | ICD-10-CM | POA: Diagnosis not present

## 2018-05-02 DIAGNOSIS — M6281 Muscle weakness (generalized): Secondary | ICD-10-CM | POA: Diagnosis not present

## 2018-05-02 DIAGNOSIS — M25641 Stiffness of right hand, not elsewhere classified: Secondary | ICD-10-CM | POA: Diagnosis not present

## 2018-05-04 DIAGNOSIS — M6281 Muscle weakness (generalized): Secondary | ICD-10-CM | POA: Diagnosis not present

## 2018-05-04 DIAGNOSIS — M79644 Pain in right finger(s): Secondary | ICD-10-CM | POA: Diagnosis not present

## 2018-05-04 DIAGNOSIS — M24541 Contracture, right hand: Secondary | ICD-10-CM | POA: Diagnosis not present

## 2018-05-04 DIAGNOSIS — M25641 Stiffness of right hand, not elsewhere classified: Secondary | ICD-10-CM | POA: Diagnosis not present

## 2018-05-09 DIAGNOSIS — M79644 Pain in right finger(s): Secondary | ICD-10-CM | POA: Diagnosis not present

## 2018-05-09 DIAGNOSIS — M25641 Stiffness of right hand, not elsewhere classified: Secondary | ICD-10-CM | POA: Diagnosis not present

## 2018-05-09 DIAGNOSIS — M24541 Contracture, right hand: Secondary | ICD-10-CM | POA: Diagnosis not present

## 2018-05-09 DIAGNOSIS — M6281 Muscle weakness (generalized): Secondary | ICD-10-CM | POA: Diagnosis not present

## 2018-05-16 ENCOUNTER — Encounter: Payer: Self-pay | Admitting: Cardiology

## 2018-05-18 DIAGNOSIS — M6281 Muscle weakness (generalized): Secondary | ICD-10-CM | POA: Diagnosis not present

## 2018-05-18 DIAGNOSIS — M24541 Contracture, right hand: Secondary | ICD-10-CM | POA: Diagnosis not present

## 2018-05-18 DIAGNOSIS — M79644 Pain in right finger(s): Secondary | ICD-10-CM | POA: Diagnosis not present

## 2018-05-18 DIAGNOSIS — M25641 Stiffness of right hand, not elsewhere classified: Secondary | ICD-10-CM | POA: Diagnosis not present

## 2018-05-24 ENCOUNTER — Encounter: Payer: Self-pay | Admitting: *Deleted

## 2018-05-25 DIAGNOSIS — Z0000 Encounter for general adult medical examination without abnormal findings: Secondary | ICD-10-CM | POA: Diagnosis not present

## 2018-05-25 DIAGNOSIS — Z139 Encounter for screening, unspecified: Secondary | ICD-10-CM | POA: Diagnosis not present

## 2018-05-25 DIAGNOSIS — E785 Hyperlipidemia, unspecified: Secondary | ICD-10-CM | POA: Diagnosis not present

## 2018-05-25 DIAGNOSIS — Z9181 History of falling: Secondary | ICD-10-CM | POA: Diagnosis not present

## 2018-05-25 DIAGNOSIS — Z1231 Encounter for screening mammogram for malignant neoplasm of breast: Secondary | ICD-10-CM | POA: Diagnosis not present

## 2018-05-25 DIAGNOSIS — N959 Unspecified menopausal and perimenopausal disorder: Secondary | ICD-10-CM | POA: Diagnosis not present

## 2018-05-30 ENCOUNTER — Ambulatory Visit (INDEPENDENT_AMBULATORY_CARE_PROVIDER_SITE_OTHER): Payer: Medicare Other | Admitting: *Deleted

## 2018-05-30 DIAGNOSIS — R55 Syncope and collapse: Secondary | ICD-10-CM | POA: Diagnosis not present

## 2018-05-31 LAB — CUP PACEART REMOTE DEVICE CHECK
Battery Remaining Longevity: 118 mo
Battery Voltage: 3.01 V
Brady Statistic AP VP Percent: 7.7 %
Brady Statistic AP VS Percent: 23 %
Brady Statistic AS VP Percent: 1 %
Brady Statistic RV Percent Paced: 8.2 %
Implantable Lead Location: 753859
Lead Channel Impedance Value: 390 Ohm
Lead Channel Pacing Threshold Amplitude: 0.5 V
Lead Channel Sensing Intrinsic Amplitude: 6.6 mV
Lead Channel Setting Pacing Amplitude: 2 V
Lead Channel Setting Pacing Pulse Width: 0.5 ms
MDC IDC LEAD IMPLANT DT: 20190205
MDC IDC LEAD IMPLANT DT: 20190205
MDC IDC LEAD LOCATION: 753860
MDC IDC MSMT BATTERY REMAINING PERCENTAGE: 95.5 %
MDC IDC MSMT LEADCHNL RA PACING THRESHOLD AMPLITUDE: 0.5 V
MDC IDC MSMT LEADCHNL RA PACING THRESHOLD PULSEWIDTH: 0.5 ms
MDC IDC MSMT LEADCHNL RA SENSING INTR AMPL: 2.2 mV
MDC IDC MSMT LEADCHNL RV IMPEDANCE VALUE: 610 Ohm
MDC IDC MSMT LEADCHNL RV PACING THRESHOLD PULSEWIDTH: 0.5 ms
MDC IDC PG IMPLANT DT: 20190205
MDC IDC SESS DTM: 20200303070014
MDC IDC SET LEADCHNL RV PACING AMPLITUDE: 0.75 V
MDC IDC SET LEADCHNL RV SENSING SENSITIVITY: 2 mV
MDC IDC STAT BRADY AS VS PERCENT: 68 %
MDC IDC STAT BRADY RA PERCENT PACED: 31 %
Pulse Gen Model: 2272
Pulse Gen Serial Number: 8993229

## 2018-06-02 ENCOUNTER — Ambulatory Visit: Payer: Medicare Other | Admitting: Cardiology

## 2018-06-02 ENCOUNTER — Encounter: Payer: Self-pay | Admitting: Cardiology

## 2018-06-02 VITALS — BP 134/70 | HR 62 | Ht 60.0 in | Wt 115.8 lb

## 2018-06-02 DIAGNOSIS — I453 Trifascicular block: Secondary | ICD-10-CM
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
## 2018-06-05 DIAGNOSIS — M25552 Pain in left hip: Secondary | ICD-10-CM | POA: Diagnosis not present

## 2018-06-05 DIAGNOSIS — J040 Acute laryngitis: Secondary | ICD-10-CM | POA: Diagnosis not present

## 2018-06-05 LAB — CUP PACEART INCLINIC DEVICE CHECK
Battery Remaining Longevity: 123 mo
Battery Voltage: 3.01 V
Brady Statistic RA Percent Paced: 31 %
Brady Statistic RV Percent Paced: 8.4 %
Date Time Interrogation Session: 20200306210251
Implantable Lead Implant Date: 20190205
Implantable Lead Implant Date: 20190205
Implantable Lead Location: 753860
Implantable Pulse Generator Implant Date: 20190205
Lead Channel Impedance Value: 387.5 Ohm
Lead Channel Impedance Value: 562.5 Ohm
Lead Channel Pacing Threshold Amplitude: 0.5 V
Lead Channel Pacing Threshold Amplitude: 0.75 V
Lead Channel Pacing Threshold Amplitude: 0.75 V
Lead Channel Pacing Threshold Pulse Width: 0.5 ms
Lead Channel Pacing Threshold Pulse Width: 0.5 ms
Lead Channel Pacing Threshold Pulse Width: 0.5 ms
Lead Channel Sensing Intrinsic Amplitude: 2.4 mV
Lead Channel Sensing Intrinsic Amplitude: 5.8 mV
Lead Channel Setting Pacing Amplitude: 2 V
Lead Channel Setting Pacing Pulse Width: 0.5 ms
Lead Channel Setting Sensing Sensitivity: 2 mV
MDC IDC LEAD LOCATION: 753859
MDC IDC SET LEADCHNL RV PACING AMPLITUDE: 0.75 V
Pulse Gen Model: 2272
Pulse Gen Serial Number: 8993229

## 2018-06-06 NOTE — Progress Notes (Signed)
Remote pacemaker transmission.   

## 2018-08-07 DIAGNOSIS — H04123 Dry eye syndrome of bilateral lacrimal glands: Secondary | ICD-10-CM | POA: Diagnosis not present

## 2018-08-11 DIAGNOSIS — M810 Age-related osteoporosis without current pathological fracture: Secondary | ICD-10-CM | POA: Diagnosis not present

## 2018-08-18 DIAGNOSIS — M810 Age-related osteoporosis without current pathological fracture: Secondary | ICD-10-CM | POA: Diagnosis not present

## 2018-08-29 ENCOUNTER — Ambulatory Visit (INDEPENDENT_AMBULATORY_CARE_PROVIDER_SITE_OTHER): Payer: Medicare Other | Admitting: *Deleted

## 2018-08-29 DIAGNOSIS — R55 Syncope and collapse: Secondary | ICD-10-CM

## 2018-08-29 DIAGNOSIS — I453 Trifascicular block: Secondary | ICD-10-CM

## 2018-08-29 LAB — CUP PACEART REMOTE DEVICE CHECK
Battery Remaining Longevity: 124 mo
Battery Remaining Percentage: 95.5 %
Battery Voltage: 3.01 V
Brady Statistic AP VP Percent: 25 %
Brady Statistic AP VS Percent: 15 %
Brady Statistic AS VP Percent: 1.5 %
Brady Statistic AS VS Percent: 59 %
Brady Statistic RA Percent Paced: 40 %
Brady Statistic RV Percent Paced: 26 %
Date Time Interrogation Session: 20200602070602
Implantable Lead Implant Date: 20190205
Implantable Lead Implant Date: 20190205
Implantable Lead Location: 753859
Implantable Lead Location: 753860
Implantable Pulse Generator Implant Date: 20190205
Lead Channel Impedance Value: 390 Ohm
Lead Channel Impedance Value: 650 Ohm
Lead Channel Pacing Threshold Amplitude: 0.5 V
Lead Channel Pacing Threshold Amplitude: 0.75 V
Lead Channel Pacing Threshold Pulse Width: 0.5 ms
Lead Channel Pacing Threshold Pulse Width: 0.5 ms
Lead Channel Sensing Intrinsic Amplitude: 2.3 mV
Lead Channel Sensing Intrinsic Amplitude: 7.7 mV
Lead Channel Setting Pacing Amplitude: 0.75 V
Lead Channel Setting Pacing Amplitude: 2 V
Lead Channel Setting Pacing Pulse Width: 0.5 ms
Lead Channel Setting Sensing Sensitivity: 2 mV
Pulse Gen Model: 2272
Pulse Gen Serial Number: 8993229

## 2018-09-06 ENCOUNTER — Encounter: Payer: Self-pay | Admitting: Cardiology

## 2018-09-06 NOTE — Progress Notes (Signed)
Remote pacemaker transmission.   

## 2018-09-25 ENCOUNTER — Other Ambulatory Visit: Payer: Self-pay
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
## 2018-10-06 DIAGNOSIS — S065X9A Traumatic subdural hemorrhage with loss of consciousness of unspecified duration, initial encounter: Secondary | ICD-10-CM | POA: Diagnosis not present

## 2018-10-06 DIAGNOSIS — R51 Headache: Secondary | ICD-10-CM | POA: Diagnosis not present

## 2018-10-06 DIAGNOSIS — G5601 Carpal tunnel syndrome, right upper limb: Secondary | ICD-10-CM | POA: Diagnosis not present

## 2018-10-06 DIAGNOSIS — M5030 Other cervical disc degeneration, unspecified cervical region: Secondary | ICD-10-CM | POA: Diagnosis not present

## 2018-10-10 DIAGNOSIS — R51 Headache: Secondary | ICD-10-CM | POA: Diagnosis not present

## 2018-10-10 DIAGNOSIS — S065X9A Traumatic subdural hemorrhage with loss of consciousness of unspecified duration, initial encounter: Secondary | ICD-10-CM | POA: Diagnosis not present

## 2018-10-11 DIAGNOSIS — M5030 Other cervical disc degeneration, unspecified cervical region: Secondary | ICD-10-CM | POA: Diagnosis not present

## 2018-10-11 DIAGNOSIS — M542 Cervicalgia: Secondary | ICD-10-CM | POA: Diagnosis not present

## 2018-10-25 DIAGNOSIS — G5601 Carpal tunnel syndrome, right upper limb: Secondary | ICD-10-CM | POA: Diagnosis not present

## 2018-10-25 DIAGNOSIS — M65322 Trigger finger, left index finger: Secondary | ICD-10-CM | POA: Diagnosis not present

## 2018-10-25 DIAGNOSIS — G5621 Lesion of ulnar nerve, right upper limb: Secondary | ICD-10-CM | POA: Diagnosis not present

## 2018-10-25 DIAGNOSIS — M65331 Trigger finger, right middle finger: Secondary | ICD-10-CM | POA: Diagnosis not present

## 2018-11-28 ENCOUNTER — Ambulatory Visit (INDEPENDENT_AMBULATORY_CARE_PROVIDER_SITE_OTHER): Payer: Medicare Other | Admitting: *Deleted

## 2018-11-28 DIAGNOSIS — I452 Bifascicular block: Secondary | ICD-10-CM

## 2018-11-28 DIAGNOSIS — R55 Syncope and collapse: Secondary | ICD-10-CM

## 2018-11-28 LAB — CUP PACEART REMOTE DEVICE CHECK
Battery Remaining Longevity: 123 mo
Battery Remaining Percentage: 95.5 %
Battery Voltage: 3.01 V
Brady Statistic AP VP Percent: 34 %
Brady Statistic AP VS Percent: 11 %
Brady Statistic AS VP Percent: 1.9 %
Brady Statistic AS VS Percent: 53 %
Brady Statistic RA Percent Paced: 44 %
Brady Statistic RV Percent Paced: 36 %
Date Time Interrogation Session: 20200901075219
Implantable Lead Implant Date: 20190205
Implantable Lead Implant Date: 20190205
Implantable Lead Location: 753859
Implantable Lead Location: 753860
Implantable Pulse Generator Implant Date: 20190205
Lead Channel Impedance Value: 400 Ohm
Lead Channel Impedance Value: 610 Ohm
Lead Channel Pacing Threshold Amplitude: 0.5 V
Lead Channel Pacing Threshold Amplitude: 0.75 V
Lead Channel Pacing Threshold Pulse Width: 0.5 ms
Lead Channel Pacing Threshold Pulse Width: 0.5 ms
Lead Channel Sensing Intrinsic Amplitude: 1.9 mV
Lead Channel Sensing Intrinsic Amplitude: 7.6 mV
Lead Channel Setting Pacing Amplitude: 0.75 V
Lead Channel Setting Pacing Amplitude: 2 V
Lead Channel Setting Pacing Pulse Width: 0.5 ms
Lead Channel Setting Sensing Sensitivity: 2 mV
Pulse Gen Model: 2272
Pulse Gen Serial Number: 8993229

## 2018-12-05 DIAGNOSIS — M1712 Unilateral primary osteoarthritis, left knee: Secondary | ICD-10-CM | POA: Diagnosis not present

## 2018-12-12 DIAGNOSIS — M1712 Unilateral primary osteoarthritis, left knee: Secondary | ICD-10-CM | POA: Diagnosis not present

## 2018-12-12 DIAGNOSIS — M7052 Other bursitis of knee, left knee: Secondary | ICD-10-CM | POA: Insufficient documentation

## 2018-12-12 NOTE — Progress Notes (Signed)
Remote pacemaker transmission.   

## 2018-12-20 DIAGNOSIS — G5601 Carpal tunnel syndrome, right upper limb: Secondary | ICD-10-CM | POA: Diagnosis not present

## 2018-12-25 DIAGNOSIS — M25562 Pain in left knee: Secondary | ICD-10-CM | POA: Diagnosis not present

## 2018-12-25 DIAGNOSIS — M6281 Muscle weakness (generalized): Secondary | ICD-10-CM | POA: Diagnosis not present

## 2018-12-27 DIAGNOSIS — M25562 Pain in left knee: Secondary | ICD-10-CM | POA: Diagnosis not present

## 2018-12-27 DIAGNOSIS — M6281 Muscle weakness (generalized): Secondary | ICD-10-CM | POA: Diagnosis not present

## 2019-01-01 DIAGNOSIS — M25562 Pain in left knee: Secondary | ICD-10-CM | POA: Diagnosis not present

## 2019-01-01 DIAGNOSIS — M6281 Muscle weakness (generalized): Secondary | ICD-10-CM | POA: Diagnosis not present

## 2019-01-03 DIAGNOSIS — M25562 Pain in left knee: Secondary | ICD-10-CM | POA: Diagnosis not present

## 2019-01-03 DIAGNOSIS — M6281 Muscle weakness (generalized): Secondary | ICD-10-CM | POA: Diagnosis not present

## 2019-01-08 DIAGNOSIS — M6281 Muscle weakness (generalized): Secondary | ICD-10-CM | POA: Diagnosis not present

## 2019-01-08 DIAGNOSIS — M25562 Pain in left knee: Secondary | ICD-10-CM | POA: Diagnosis not present

## 2019-01-10 DIAGNOSIS — M6281 Muscle weakness (generalized): Secondary | ICD-10-CM | POA: Diagnosis not present

## 2019-01-10 DIAGNOSIS — M25562 Pain in left knee: Secondary | ICD-10-CM | POA: Diagnosis not present

## 2019-01-17 DIAGNOSIS — Z23 Encounter for immunization: Secondary | ICD-10-CM | POA: Diagnosis not present

## 2019-01-26 DIAGNOSIS — M7052 Other bursitis of knee, left knee: Secondary | ICD-10-CM | POA: Diagnosis not present

## 2019-01-26 DIAGNOSIS — M1712 Unilateral primary osteoarthritis, left knee: Secondary | ICD-10-CM | POA: Diagnosis not present

## 2019-02-05 ENCOUNTER — Other Ambulatory Visit: Payer: Self-pay

## 2019-02-05 NOTE — Patient Outreach (Signed)
[facility]) Care Management  02/05/2019  Julia Hunt 1927-12-25 [id number]   Medication Adherence call to Julia Hunt Verify spoke with patient she is past due on Atorvastatin 40 mg she explain she takes 1 tablet daily but sometimes she forgets to take it,she said she has plenty at this time and will order when due,patient wants to start receiving it from [facility] but will ask her doctor to send prescriptions to [facility] on her next appointment. Julia Hunt is showing past due under [facility].   [place] Management Direct Dial [phone]  Fax [phone] [email or web address]

## 2019-02-06 DIAGNOSIS — M810 Age-related osteoporosis without current pathological fracture: Secondary | ICD-10-CM | POA: Diagnosis not present

## 2019-02-19 DIAGNOSIS — M810 Age-related osteoporosis without current pathological fracture: Secondary | ICD-10-CM | POA: Diagnosis not present

## 2019-02-21 DIAGNOSIS — M25562 Pain in left knee: Secondary | ICD-10-CM | POA: Diagnosis not present

## 2019-02-21 DIAGNOSIS — M6281 Muscle weakness (generalized): Secondary | ICD-10-CM | POA: Diagnosis not present

## 2019-02-27 ENCOUNTER — Ambulatory Visit (INDEPENDENT_AMBULATORY_CARE_PROVIDER_SITE_OTHER): Payer: Medicare Other | Admitting: *Deleted

## 2019-02-27 DIAGNOSIS — I452 Bifascicular block: Secondary | ICD-10-CM | POA: Diagnosis not present

## 2019-02-27 LAB — CUP PACEART REMOTE DEVICE CHECK
Battery Remaining Longevity: 120 mo
Battery Remaining Percentage: 95.5 %
Battery Voltage: 3.01 V
Brady Statistic AP VP Percent: 35 %
Brady Statistic AP VS Percent: 12 %
Brady Statistic AS VP Percent: 2.1 %
Brady Statistic AS VS Percent: 50 %
Brady Statistic RA Percent Paced: 47 %
Brady Statistic RV Percent Paced: 37 %
Date Time Interrogation Session: 20201201053256
Implantable Lead Implant Date: 20190205
Implantable Lead Implant Date: 20190205
Implantable Lead Location: 753859
Implantable Lead Location: 753860
Implantable Pulse Generator Implant Date: 20190205
Lead Channel Impedance Value: 380 Ohm
Lead Channel Impedance Value: 610 Ohm
Lead Channel Pacing Threshold Amplitude: 0.5 V
Lead Channel Pacing Threshold Amplitude: 0.75 V
Lead Channel Pacing Threshold Pulse Width: 0.5 ms
Lead Channel Pacing Threshold Pulse Width: 0.5 ms
Lead Channel Sensing Intrinsic Amplitude: 2.2 mV
Lead Channel Sensing Intrinsic Amplitude: 7.6 mV
Lead Channel Setting Pacing Amplitude: 0.75 V
Lead Channel Setting Pacing Amplitude: 2 V
Lead Channel Setting Pacing Pulse Width: 0.5 ms
Lead Channel Setting Sensing Sensitivity: 2 mV
Pulse Gen Model: 2272
Pulse Gen Serial Number: 8993229

## 2019-02-28 DIAGNOSIS — M6281 Muscle weakness (generalized): Secondary | ICD-10-CM | POA: Diagnosis not present

## 2019-02-28 DIAGNOSIS — M25562 Pain in left knee: Secondary | ICD-10-CM | POA: Diagnosis not present

## 2019-03-14 DIAGNOSIS — H524 Presbyopia: Secondary | ICD-10-CM | POA: Diagnosis not present

## 2019-03-14 DIAGNOSIS — H04123 Dry eye syndrome of bilateral lacrimal glands: Secondary | ICD-10-CM | POA: Diagnosis not present

## 2019-03-24 NOTE — Progress Notes (Signed)
PPM remote 

## 2019-03-27 DIAGNOSIS — E039 Hypothyroidism, unspecified: Secondary | ICD-10-CM | POA: Diagnosis not present

## 2019-03-27 DIAGNOSIS — D509 Iron deficiency anemia, unspecified: Secondary | ICD-10-CM | POA: Diagnosis not present

## 2019-03-27 DIAGNOSIS — L97511 Non-pressure chronic ulcer of other part of right foot limited to breakdown of skin: Secondary | ICD-10-CM | POA: Diagnosis not present

## 2019-03-27 DIAGNOSIS — E785 Hyperlipidemia, unspecified: Secondary | ICD-10-CM | POA: Diagnosis not present

## 2019-04-13 DIAGNOSIS — L97319 Non-pressure chronic ulcer of right ankle with unspecified severity: Secondary | ICD-10-CM | POA: Diagnosis not present

## 2019-04-13 DIAGNOSIS — I10 Essential (primary) hypertension: Secondary | ICD-10-CM | POA: Diagnosis not present

## 2019-05-02 DIAGNOSIS — M1712 Unilateral primary osteoarthritis, left knee: Secondary | ICD-10-CM | POA: Diagnosis not present

## 2019-05-29 ENCOUNTER — Ambulatory Visit (INDEPENDENT_AMBULATORY_CARE_PROVIDER_SITE_OTHER): Payer: Medicare Other | Admitting: *Deleted

## 2019-05-29 DIAGNOSIS — I452 Bifascicular block: Secondary | ICD-10-CM

## 2019-05-29 DIAGNOSIS — E785 Hyperlipidemia, unspecified: Secondary | ICD-10-CM | POA: Diagnosis not present

## 2019-05-29 DIAGNOSIS — Z0000 Encounter for general adult medical examination without abnormal findings: Secondary | ICD-10-CM | POA: Diagnosis not present

## 2019-05-29 DIAGNOSIS — Z139 Encounter for screening, unspecified: Secondary | ICD-10-CM | POA: Diagnosis not present

## 2019-05-29 DIAGNOSIS — Z9181 History of falling: Secondary | ICD-10-CM | POA: Diagnosis not present

## 2019-05-29 LAB — CUP PACEART REMOTE DEVICE CHECK
Battery Remaining Longevity: 120 mo
Battery Remaining Percentage: 95.5 %
Battery Voltage: 3.01 V
Brady Statistic AP VP Percent: 35 %
Brady Statistic AP VS Percent: 12 %
Brady Statistic AS VP Percent: 2.6 %
Brady Statistic AS VS Percent: 50 %
Brady Statistic RA Percent Paced: 46 %
Brady Statistic RV Percent Paced: 37 %
Date Time Interrogation Session: 20210302020017
Implantable Lead Implant Date: 20190205
Implantable Lead Implant Date: 20190205
Implantable Lead Location: 753859
Implantable Lead Location: 753860
Implantable Pulse Generator Implant Date: 20190205
Lead Channel Impedance Value: 380 Ohm
Lead Channel Impedance Value: 600 Ohm
Lead Channel Pacing Threshold Amplitude: 0.5 V
Lead Channel Pacing Threshold Amplitude: 0.75 V
Lead Channel Pacing Threshold Pulse Width: 0.5 ms
Lead Channel Pacing Threshold Pulse Width: 0.5 ms
Lead Channel Sensing Intrinsic Amplitude: 10.2 mV
Lead Channel Sensing Intrinsic Amplitude: 2.5 mV
Lead Channel Setting Pacing Amplitude: 0.75 V
Lead Channel Setting Pacing Amplitude: 2 V
Lead Channel Setting Pacing Pulse Width: 0.5 ms
Lead Channel Setting Sensing Sensitivity: 2 mV
Pulse Gen Model: 2272
Pulse Gen Serial Number: 8993229

## 2019-05-30 NOTE — Progress Notes (Signed)
PPM Remote  

## 2019-05-31 DIAGNOSIS — R0989 Other specified symptoms and signs involving the circulatory and respiratory systems: Secondary | ICD-10-CM | POA: Diagnosis not present

## 2019-06-12 DIAGNOSIS — R0989 Other specified symptoms and signs involving the circulatory and respiratory systems: Secondary | ICD-10-CM | POA: Diagnosis not present

## 2019-06-12 DIAGNOSIS — K219 Gastro-esophageal reflux disease without esophagitis: Secondary | ICD-10-CM | POA: Diagnosis not present

## 2019-06-13 DIAGNOSIS — D509 Iron deficiency anemia, unspecified: Secondary | ICD-10-CM | POA: Diagnosis not present

## 2019-06-13 DIAGNOSIS — M810 Age-related osteoporosis without current pathological fracture: Secondary | ICD-10-CM | POA: Diagnosis not present

## 2019-06-13 DIAGNOSIS — E785 Hyperlipidemia, unspecified: Secondary | ICD-10-CM | POA: Diagnosis not present

## 2019-06-13 DIAGNOSIS — E039 Hypothyroidism, unspecified: Secondary | ICD-10-CM | POA: Diagnosis not present

## 2019-06-13 DIAGNOSIS — K219 Gastro-esophageal reflux disease without esophagitis: Secondary | ICD-10-CM | POA: Diagnosis not present

## 2019-06-13 DIAGNOSIS — K222 Esophageal obstruction: Secondary | ICD-10-CM | POA: Diagnosis not present

## 2019-07-03 ENCOUNTER — Other Ambulatory Visit: Payer: Self-pay

## 2019-07-03 ENCOUNTER — Ambulatory Visit: Payer: Medicare Other | Admitting: Cardiology

## 2019-07-03 ENCOUNTER — Encounter: Payer: Self-pay | Admitting: Cardiology

## 2019-07-03 VITALS — BP 120/64 | HR 68 | Ht 60.0 in | Wt 115.0 lb

## 2019-07-03 DIAGNOSIS — I453 Trifascicular block: Secondary | ICD-10-CM | POA: Diagnosis not present
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
## 2019-07-30 DIAGNOSIS — M25511 Pain in right shoulder: Secondary | ICD-10-CM | POA: Diagnosis not present

## 2019-08-01 DIAGNOSIS — M1712 Unilateral primary osteoarthritis, left knee: Secondary | ICD-10-CM | POA: Diagnosis not present

## 2019-08-08 DIAGNOSIS — M7541 Impingement syndrome of right shoulder: Secondary | ICD-10-CM | POA: Diagnosis not present

## 2019-08-08 DIAGNOSIS — M25511 Pain in right shoulder: Secondary | ICD-10-CM | POA: Diagnosis not present

## 2019-08-13 DIAGNOSIS — M7541 Impingement syndrome of right shoulder: Secondary | ICD-10-CM | POA: Diagnosis not present

## 2019-08-13 DIAGNOSIS — M810 Age-related osteoporosis without current pathological fracture: Secondary | ICD-10-CM | POA: Diagnosis not present

## 2019-08-13 DIAGNOSIS — M25511 Pain in right shoulder: Secondary | ICD-10-CM | POA: Diagnosis not present

## 2019-08-21 DIAGNOSIS — M810 Age-related osteoporosis without current pathological fracture: Secondary | ICD-10-CM | POA: Diagnosis not present

## 2019-08-22 DIAGNOSIS — M7541 Impingement syndrome of right shoulder: Secondary | ICD-10-CM | POA: Diagnosis not present

## 2019-08-22 DIAGNOSIS — M25511 Pain in right shoulder: Secondary | ICD-10-CM | POA: Diagnosis not present

## 2019-08-28 ENCOUNTER — Ambulatory Visit (INDEPENDENT_AMBULATORY_CARE_PROVIDER_SITE_OTHER): Payer: Medicare Other | Admitting: *Deleted

## 2019-08-28 DIAGNOSIS — R55 Syncope and collapse: Secondary | ICD-10-CM | POA: Diagnosis not present

## 2019-08-28 LAB — CUP PACEART REMOTE DEVICE CHECK
Battery Remaining Longevity: 119 mo
Battery Remaining Percentage: 95.5 %
Battery Voltage: 3.01 V
Brady Statistic AP VP Percent: 30 %
Brady Statistic AP VS Percent: 18 %
Brady Statistic AS VP Percent: 2.4 %
Brady Statistic AS VS Percent: 49 %
Brady Statistic RA Percent Paced: 46 %
Brady Statistic RV Percent Paced: 33 %
Date Time Interrogation Session: 20210601040040
Implantable Lead Implant Date: 20190205
Implantable Lead Implant Date: 20190205
Implantable Lead Location: 753859
Implantable Lead Location: 753860
Implantable Pulse Generator Implant Date: 20190205
Lead Channel Impedance Value: 350 Ohm
Lead Channel Impedance Value: 560 Ohm
Lead Channel Pacing Threshold Amplitude: 0.5 V
Lead Channel Pacing Threshold Amplitude: 0.5 V
Lead Channel Pacing Threshold Pulse Width: 0.5 ms
Lead Channel Pacing Threshold Pulse Width: 0.5 ms
Lead Channel Sensing Intrinsic Amplitude: 2.6 mV
Lead Channel Sensing Intrinsic Amplitude: 7.7 mV
Lead Channel Setting Pacing Amplitude: 0.75 V
Lead Channel Setting Pacing Amplitude: 2 V
Lead Channel Setting Pacing Pulse Width: 0.5 ms
Lead Channel Setting Sensing Sensitivity: 2 mV
Pulse Gen Model: 2272
Pulse Gen Serial Number: 8993229

## 2019-08-29 DIAGNOSIS — M7541 Impingement syndrome of right shoulder: Secondary | ICD-10-CM | POA: Diagnosis not present

## 2019-08-29 DIAGNOSIS — M25511 Pain in right shoulder: Secondary | ICD-10-CM | POA: Diagnosis not present

## 2019-08-29 NOTE — Progress Notes (Signed)
Remote pacemaker transmission.   

## 2019-09-05 DIAGNOSIS — M25511 Pain in right shoulder: Secondary | ICD-10-CM | POA: Diagnosis not present

## 2019-09-05 DIAGNOSIS — M7541 Impingement syndrome of right shoulder: Secondary | ICD-10-CM | POA: Diagnosis not present

## 2019-10-29 LAB — CUP PACEART INCLINIC DEVICE CHECK
Brady Statistic RA Percent Paced: 46 %
Brady Statistic RV Percent Paced: 37 %
Date Time Interrogation Session: 20210406104700
Implantable Lead Implant Date: 20190205
Implantable Lead Implant Date: 20190205
Implantable Lead Location: 753859
Implantable Lead Location: 753860
Implantable Pulse Generator Implant Date: 20190205
Lead Channel Pacing Threshold Amplitude: 0.5 V
Lead Channel Pacing Threshold Amplitude: 0.625 V
Lead Channel Pacing Threshold Pulse Width: 0.5 ms
Lead Channel Pacing Threshold Pulse Width: 0.5 ms
Lead Channel Sensing Intrinsic Amplitude: 2.6 mV
Lead Channel Sensing Intrinsic Amplitude: 9 mV
Pulse Gen Model: 2272
Pulse Gen Serial Number: 8993229

## 2019-11-05 DIAGNOSIS — K625 Hemorrhage of anus and rectum: Secondary | ICD-10-CM | POA: Diagnosis not present

## 2019-11-05 DIAGNOSIS — R1032 Left lower quadrant pain: Secondary | ICD-10-CM | POA: Diagnosis not present

## 2019-11-07 DIAGNOSIS — M25511 Pain in right shoulder: Secondary | ICD-10-CM | POA: Diagnosis not present

## 2019-11-07 DIAGNOSIS — G8929 Other chronic pain: Secondary | ICD-10-CM | POA: Diagnosis not present

## 2019-11-13 DIAGNOSIS — R1909 Other intra-abdominal and pelvic swelling, mass and lump: Secondary | ICD-10-CM | POA: Diagnosis not present

## 2019-11-13 DIAGNOSIS — N281 Cyst of kidney, acquired: Secondary | ICD-10-CM | POA: Diagnosis not present

## 2019-11-13 DIAGNOSIS — K7689 Other specified diseases of liver: Secondary | ICD-10-CM | POA: Diagnosis not present

## 2019-11-13 DIAGNOSIS — R1032 Left lower quadrant pain: Secondary | ICD-10-CM | POA: Diagnosis not present

## 2019-11-13 DIAGNOSIS — C8213 Follicular lymphoma grade II, intra-abdominal lymph nodes: Secondary | ICD-10-CM | POA: Insufficient documentation

## 2019-11-13 DIAGNOSIS — S32592A Other specified fracture of left pubis, initial encounter for closed fracture: Secondary | ICD-10-CM | POA: Diagnosis not present

## 2019-11-13 DIAGNOSIS — K869 Disease of pancreas, unspecified: Secondary | ICD-10-CM | POA: Diagnosis not present

## 2019-11-13 DIAGNOSIS — D1803 Hemangioma of intra-abdominal structures: Secondary | ICD-10-CM | POA: Diagnosis not present

## 2019-11-13 DIAGNOSIS — K862 Cyst of pancreas: Secondary | ICD-10-CM | POA: Diagnosis not present

## 2019-11-13 DIAGNOSIS — I700 Atherosclerosis of aorta: Secondary | ICD-10-CM | POA: Diagnosis not present

## 2019-11-13 DIAGNOSIS — K5730 Diverticulosis of large intestine without perforation or abscess without bleeding: Secondary | ICD-10-CM | POA: Diagnosis not present

## 2019-11-26 DIAGNOSIS — R222 Localized swelling, mass and lump, trunk: Secondary | ICD-10-CM | POA: Diagnosis not present

## 2019-11-26 DIAGNOSIS — I700 Atherosclerosis of aorta: Secondary | ICD-10-CM | POA: Diagnosis not present

## 2019-11-26 DIAGNOSIS — R1909 Other intra-abdominal and pelvic swelling, mass and lump: Secondary | ICD-10-CM | POA: Diagnosis not present

## 2019-11-26 DIAGNOSIS — I2510 Atherosclerotic heart disease of native coronary artery without angina pectoris: Secondary | ICD-10-CM | POA: Diagnosis not present

## 2019-11-27 ENCOUNTER — Ambulatory Visit (INDEPENDENT_AMBULATORY_CARE_PROVIDER_SITE_OTHER): Payer: Medicare Other | Admitting: *Deleted

## 2019-11-27 DIAGNOSIS — R55 Syncope and collapse: Secondary | ICD-10-CM

## 2019-11-27 LAB — CUP PACEART REMOTE DEVICE CHECK
Battery Remaining Longevity: 119 mo
Battery Remaining Percentage: 95.5 %
Battery Voltage: 3.01 V
Brady Statistic AP VP Percent: 27 %
Brady Statistic AP VS Percent: 17 %
Brady Statistic AS VP Percent: 2.7 %
Brady Statistic AS VS Percent: 52 %
Brady Statistic RA Percent Paced: 43 %
Brady Statistic RV Percent Paced: 30 %
Date Time Interrogation Session: 20210831024154
Implantable Lead Implant Date: 20190205
Implantable Lead Implant Date: 20190205
Implantable Lead Location: 753859
Implantable Lead Location: 753860
Implantable Pulse Generator Implant Date: 20190205
Lead Channel Impedance Value: 350 Ohm
Lead Channel Impedance Value: 540 Ohm
Lead Channel Pacing Threshold Amplitude: 0.5 V
Lead Channel Pacing Threshold Amplitude: 0.5 V
Lead Channel Pacing Threshold Pulse Width: 0.5 ms
Lead Channel Pacing Threshold Pulse Width: 0.5 ms
Lead Channel Sensing Intrinsic Amplitude: 2.6 mV
Lead Channel Sensing Intrinsic Amplitude: 7.2 mV
Lead Channel Setting Pacing Amplitude: 0.75 V
Lead Channel Setting Pacing Amplitude: 2 V
Lead Channel Setting Pacing Pulse Width: 0.5 ms
Lead Channel Setting Sensing Sensitivity: 2 mV
Pulse Gen Model: 2272
Pulse Gen Serial Number: 8993229

## 2019-11-28 DIAGNOSIS — R222 Localized swelling, mass and lump, trunk: Secondary | ICD-10-CM | POA: Diagnosis not present

## 2019-11-28 NOTE — Progress Notes (Signed)
Remote pacemaker transmission.   

## 2019-12-05 DIAGNOSIS — D649 Anemia, unspecified: Secondary | ICD-10-CM | POA: Diagnosis not present

## 2019-12-05 DIAGNOSIS — I7789 Other specified disorders of arteries and arterioles: Secondary | ICD-10-CM | POA: Diagnosis not present

## 2019-12-05 DIAGNOSIS — R970 Elevated carcinoembryonic antigen [CEA]: Secondary | ICD-10-CM | POA: Diagnosis not present

## 2019-12-05 DIAGNOSIS — Z7901 Long term (current) use of anticoagulants: Secondary | ICD-10-CM | POA: Diagnosis not present

## 2019-12-12 DIAGNOSIS — I7789 Other specified disorders of arteries and arterioles: Secondary | ICD-10-CM | POA: Diagnosis not present

## 2019-12-12 DIAGNOSIS — Z0184 Encounter for antibody response examination: Secondary | ICD-10-CM | POA: Diagnosis not present

## 2019-12-12 DIAGNOSIS — Z7901 Long term (current) use of anticoagulants: Secondary | ICD-10-CM | POA: Diagnosis not present

## 2019-12-12 DIAGNOSIS — D649 Anemia, unspecified: Secondary | ICD-10-CM | POA: Diagnosis not present

## 2019-12-13 DIAGNOSIS — E039 Hypothyroidism, unspecified: Secondary | ICD-10-CM | POA: Diagnosis not present

## 2019-12-13 DIAGNOSIS — Z9181 History of falling: Secondary | ICD-10-CM | POA: Diagnosis not present

## 2019-12-13 DIAGNOSIS — K222 Esophageal obstruction: Secondary | ICD-10-CM | POA: Diagnosis not present

## 2019-12-13 DIAGNOSIS — K219 Gastro-esophageal reflux disease without esophagitis: Secondary | ICD-10-CM | POA: Diagnosis not present

## 2019-12-13 DIAGNOSIS — D509 Iron deficiency anemia, unspecified: Secondary | ICD-10-CM | POA: Diagnosis not present

## 2019-12-13 DIAGNOSIS — E785 Hyperlipidemia, unspecified: Secondary | ICD-10-CM | POA: Diagnosis not present

## 2019-12-14 ENCOUNTER — Other Ambulatory Visit (HOSPITAL_COMMUNITY)
Admission: RE | Admit: 2019-12-14 | Discharge: 2019-12-14 | Disposition: A | Discharge status: Home / Self Care | Payer: Medicare Other | Source: Ambulatory Visit | Attending: Hematology and Oncology | Admitting: Hematology and Oncology

## 2019-12-14 DIAGNOSIS — C8203 Follicular lymphoma grade I, intra-abdominal lymph nodes: Secondary | ICD-10-CM | POA: Diagnosis not present

## 2019-12-14 DIAGNOSIS — R9389 Abnormal findings on diagnostic imaging of other specified body structures: Secondary | ICD-10-CM | POA: Diagnosis not present

## 2019-12-14 DIAGNOSIS — C8210 Follicular lymphoma grade II, unspecified site: Secondary | ICD-10-CM | POA: Diagnosis not present

## 2019-12-14 DIAGNOSIS — I358 Other nonrheumatic aortic valve disorders: Secondary | ICD-10-CM | POA: Diagnosis not present

## 2019-12-14 DIAGNOSIS — R1909 Other intra-abdominal and pelvic swelling, mass and lump: Secondary | ICD-10-CM | POA: Diagnosis not present

## 2019-12-18 LAB — SURGICAL PATHOLOGY

## 2019-12-21 DIAGNOSIS — N281 Cyst of kidney, acquired: Secondary | ICD-10-CM | POA: Diagnosis not present

## 2019-12-21 DIAGNOSIS — R918 Other nonspecific abnormal finding of lung field: Secondary | ICD-10-CM | POA: Diagnosis not present

## 2019-12-21 DIAGNOSIS — R9389 Abnormal findings on diagnostic imaging of other specified body structures: Secondary | ICD-10-CM | POA: Diagnosis not present

## 2019-12-21 DIAGNOSIS — K8689 Other specified diseases of pancreas: Secondary | ICD-10-CM | POA: Diagnosis not present

## 2019-12-21 DIAGNOSIS — I700 Atherosclerosis of aorta: Secondary | ICD-10-CM | POA: Diagnosis not present

## 2019-12-24 DIAGNOSIS — M25511 Pain in right shoulder: Secondary | ICD-10-CM | POA: Diagnosis not present

## 2019-12-24 DIAGNOSIS — G8929 Other chronic pain: Secondary | ICD-10-CM | POA: Diagnosis not present

## 2019-12-29 IMAGING — CR DG CHEST 2V
2 series · 2 of 2 positions shown · non-contrast
Comparison: None.

CLINICAL DATA: Status post pacemaker placement

EXAM:
CHEST  2 VIEW

[chest lat]
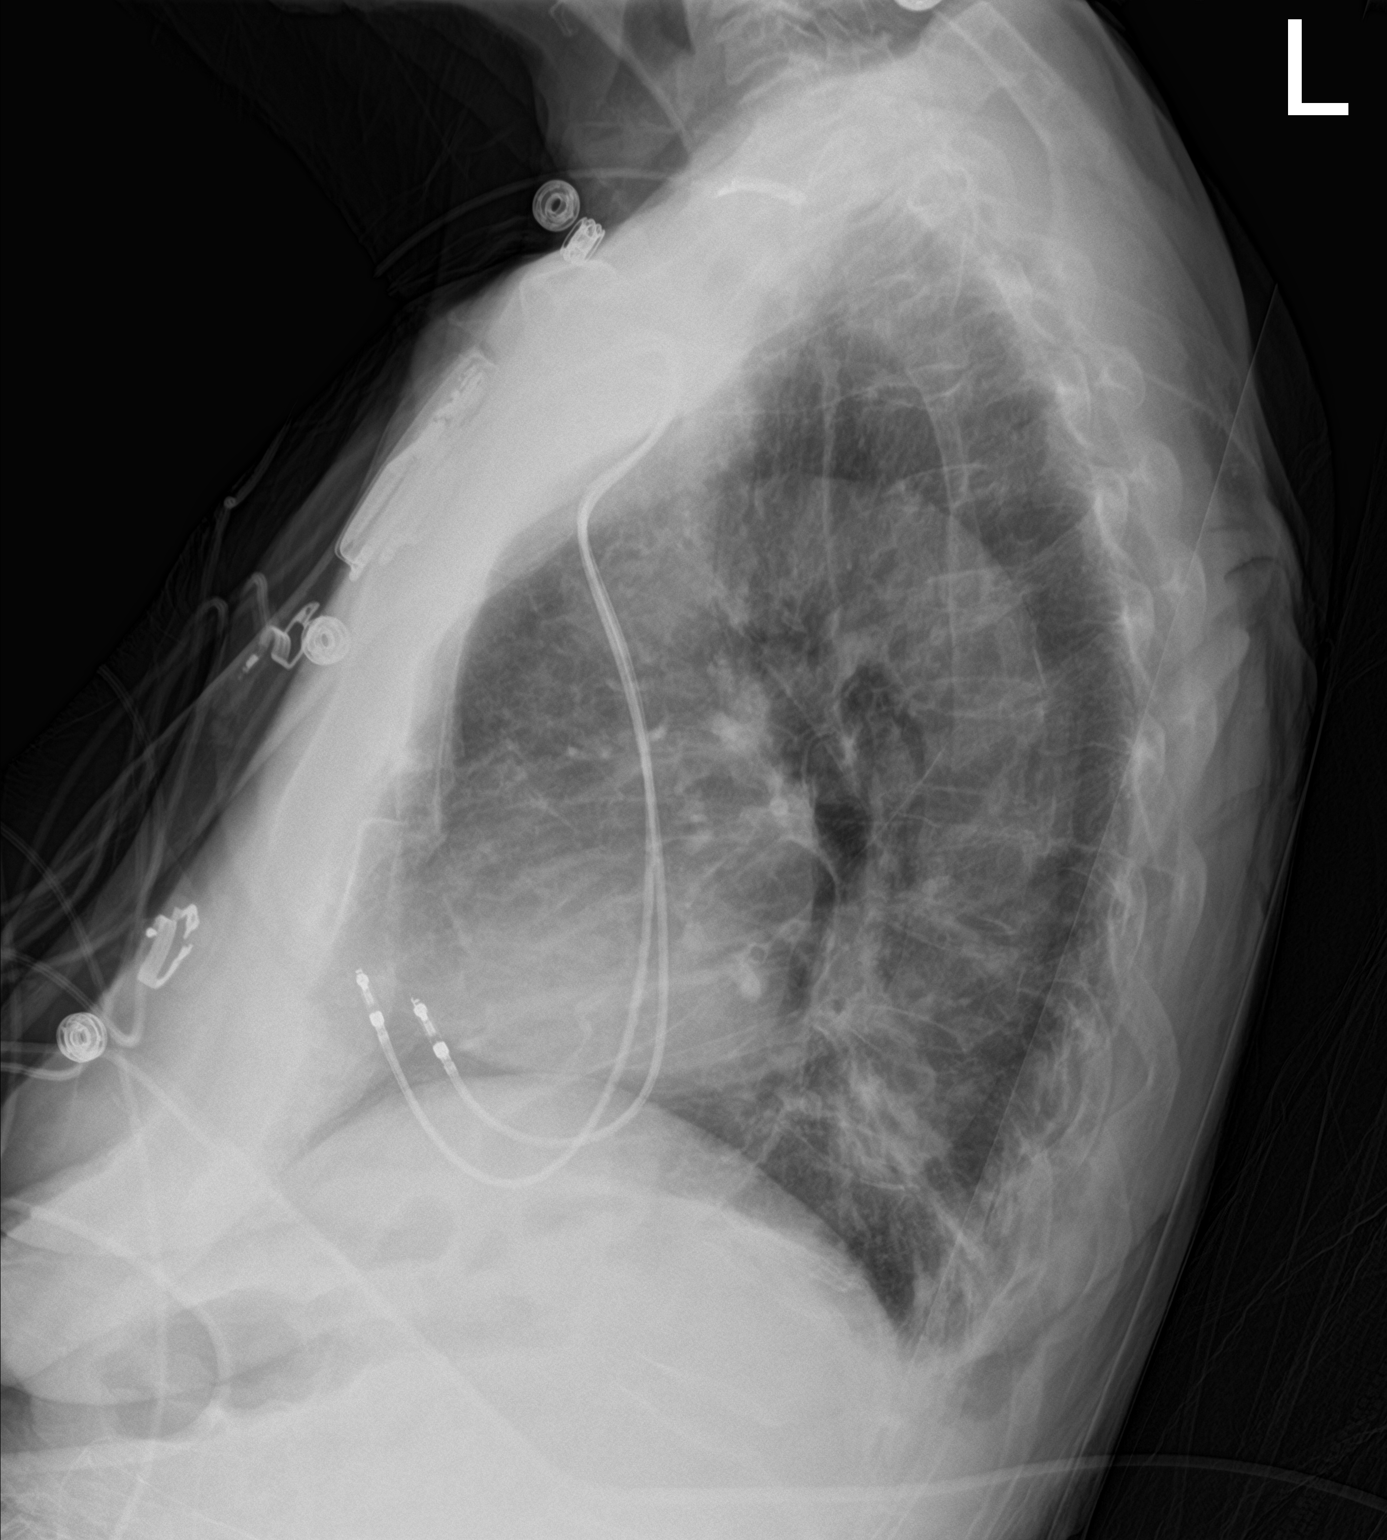

[chest ap]
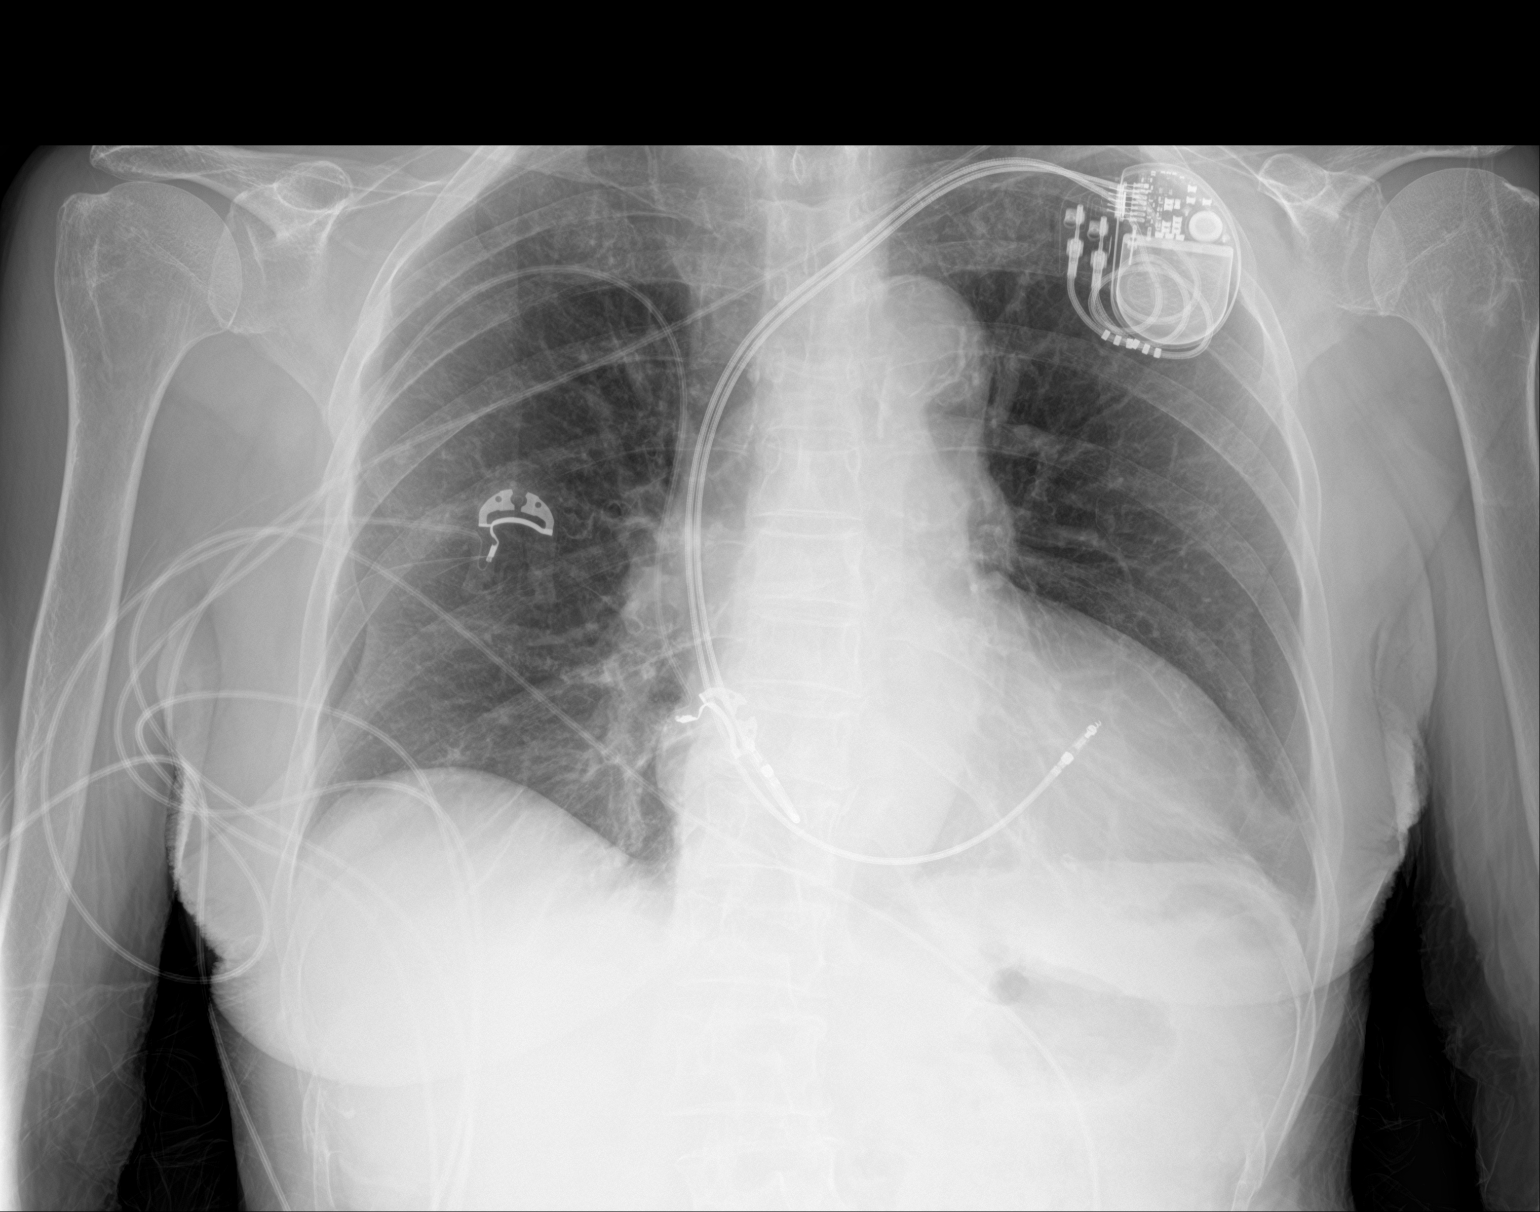

[2 of 2 positions shown; findings below may reference images not displayed]

FINDINGS: Cardiac shadow is at the upper limits of normal in size. Two lead
pacing device is seen. No pneumothorax is noted. The lungs are
clear. No bony abnormality is seen.
IMPRESSION: No pneumothorax following pacemaker placement

## 2020-01-30 NOTE — Progress Notes (Signed)
[facility]  [address] [place],  [place]  [address] [phone]  Clinic Day:  01/31/2020  Referring physician: Nicoletta Dress, MD   This document serves as a record of services personally performed by Hosie Poisson, MD. It was created on their behalf by [facility], a trained medical scribe. The creation of this record is based on the scribe's personal observations and the provider's statements to them.   CHIEF COMPLAINT:  CC: Low grade lymphoma  Current Treatment:  Surveillance   HISTORY OF PRESENT ILLNESS:  Julia Hunt is a 86 y.o. female with low grade lymphoma diagnosed in September 2021.  The abdominal mass was discovered by her primary care provider.  Initial presentation included abdominal pain, abdominal fullness, abdominal swelling, nausea, vomiting and constipation.   The onset was gradual and worsened over time.  CT imaging was pursued on August 17th which revealed a new left para-aortic mass 4.3 x 3.8 x 4.7 cm, favored to be an abnormal enlarged lymph node.  PET imaging confirmed this mass to be hypermetabolic measuring up to 4.9 cm in long axis with maximum SUV of 7.8.  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 There were small abnormal lymphocytes with irregular nuclear contour.  These cells stained positive for CD 20, CD10, BCL6, BCL2, and were negative for Cyclin D1, with a low Ki 67.  She did also have blood in her stool.  She is now completely asymptomatic, with no abdominal pain.  A previous CT of chest, abdomen and pelvis from 2018 did not show this abnormality.    INTERVAL HISTORY:  Julia Hunt is here for follow up and states that she is well other than fatigue, but she has attributed this to her age.  She will occasionally get hot flashes, but denies night sweats.  She states that her calcium level was mildly low at her last visit with Dr. Delena Bali.  She is  currently on oral calcium supplement twice daily as she is on Prolia, but occasionally, she will forget some doses.  We are obtaining labs today, so I will let her know the results.  Her  appetite is good, and she has lost 2 and 1/2 pounds since her last visit.  She denies fever, chills or other signs of infection.  She denies nausea, vomiting, bowel issues, or abdominal pain.  She denies sore throat, cough, dyspnea, or chest pain.  She has had her COVID vaccine booster.   REVIEW OF SYSTEMS:  Review of Systems  Constitutional: Positive for fatigue.  Endocrine: Positive for hot flashes (intermittent).  All other systems reviewed and are negative.    VITALS:  Blood pressure (!) 146/72, pulse 84, temperature 98.4 F (36.9 C), temperature source Oral, resp. rate 18, height 5' (1.524 m), weight 118 lb 6.4 oz (53.7 kg), SpO2 95 %.  Wt Readings from Last 3 Encounters:  01/31/20 118 lb 6.4 oz (53.7 kg)  12/21/19 120 lb 14.4 oz (54.8 kg)  07/03/19 115 lb (52.2 kg)    Body mass index is 23.12 kg/m.  Performance status (ECOG): 1 - Symptomatic but completely ambulatory  PHYSICAL EXAM:  Physical Exam Constitutional:      General: She is not in acute distress.    Appearance: Normal appearance. She is normal weight.  HENT:     Head: Normocephalic and atraumatic.  Eyes:     General: No scleral icterus.    Extraocular Movements: Extraocular  movements intact.     Conjunctiva/sclera: Conjunctivae normal.     Pupils: Pupils are equal, round, and reactive to light.  Cardiovascular:     Rate and Rhythm: Normal rate and regular rhythm.     Pulses: Normal pulses.     Heart sounds: Normal heart sounds. No murmur heard.  No friction rub. No gallop.   Pulmonary:     Effort: Pulmonary effort is normal. No respiratory distress.     Breath sounds: Normal breath sounds.  Chest:     Breasts:        Right: Normal.      Comments: She has some cysts in the lower half of the left breast.  She has a  pacemaker in the left upper chest. Abdominal:     General: Bowel sounds are normal. There is no distension.     Palpations: Abdomen is soft. There is no mass.     Tenderness: There is no abdominal tenderness.  Musculoskeletal:        General: Normal range of motion.     Cervical back: Normal range of motion and neck supple.     Right lower leg: No edema.     Left lower leg: No edema.     Comments: She has a lipoma in the right upper arm.   Lymphadenopathy:     Cervical: No cervical adenopathy.     Upper Body:     Right upper body: No supraclavicular adenopathy.     Left upper body: Axillary adenopathy (measuring 2 cm, firm, mobile and nontender) present. No supraclavicular adenopathy.     Lower Body: No right inguinal adenopathy. No left inguinal adenopathy.     Comments:  She may have a couple of small tiny nodes in the peri-epitrochlear area.  Skin:    General: Skin is warm and dry.  Neurological:     General: No focal deficit present.     Mental Status: She is alert and oriented to person, place, and time. Mental status is at baseline.  Psychiatric:        Mood and Affect: Mood normal.        Behavior: Behavior normal.        Thought Content: Thought content normal.        Judgment: Judgment normal.    LABS:   CBC Latest Ref Rng & Units 04/27/2017  WBC 3.4 - 10.8 x10E3/uL 6.7  Hemoglobin 11.1 - 15.9 g/dL 12.2  Hematocrit 34.0 - 46.6 % 38.8  Platelets 150 - 379 x10E3/uL 230   CMP Latest Ref Rng & Units 04/27/2017  Glucose 65 - 99 mg/dL 98  BUN 8 - 27 mg/dL 23  Creatinine 0.57 - 1.00 mg/dL 1.02(H)  Sodium 134 - 144 mmol/L 145(H)  Potassium 3.5 - 5.2 mmol/L 4.0  Chloride 96 - 106 mmol/L 107(H)  CO2 20 - 29 mmol/L 25  Calcium 8.7 - 10.3 mg/dL 9.0    No results found for: CEA1 / No results found for: CEA1 No results found for: LDH   STUDIES:  No results found.   Allergies:  Allergies  Allergen Reactions   Clarithromycin Rash   Penicillins Rash    Has patient  had a PCN reaction causing immediate rash, facial/tongue/throat swelling, SOB or lightheadedness with hypotension: Unknown Has patient had a PCN reaction causing severe rash involving mucus membranes or skin necrosis: Unknown Has patient had a PCN reaction that required hospitalization: No Has patient had a PCN reaction occurring within the last 10  years: No If all of the above answers are "NO", then may proceed with Cephalosporin use.    Sulfa Antibiotics Rash    Current Medications: Current Outpatient Medications  Medication Sig Dispense Refill   atorvastatin (LIPITOR) 40 MG tablet Take 1 tablet by mouth daily.     Calcium Carbonate-Vitamin D (CALCIUM 600+D) 600-400 MG-UNIT tablet Take 1 tablet by mouth 2 (two) times daily.      levothyroxine (SYNTHROID, LEVOTHROID) 75 MCG tablet Take 75 mcg by mouth daily before breakfast.     Multiple Vitamin (MULTIVITAMIN) capsule Take 1 capsule by mouth daily.     sertraline (ZOLOFT) 100 MG tablet Take 100 mg by mouth daily.     sodium chloride (OCEAN) 0.65 % SOLN nasal spray Place 1 spray into both nostrils daily as needed for congestion.      traMADol (ULTRAM) 50 MG tablet Take 50 mg by mouth daily.   0   No current facility-administered medications for this visit.     ASSESSMENT & PLAN:   Assessment:  This is an elderly but fit woman despite her many comorbidities and surgeries.  She has a low grade lymphoma and is fairly asymptomatic at this time.  After discussing the risks and benefits, we have opted for surveillance but she was instructed to call if she develops abdominal pain again.  This appears to be a stage IA. I cannot explain the mild elevation of the CEA.   Plan: We discussed that she wishes to focus on quality of life rather than quantity.  She already has a living will in place, and asked about a DNR.  I explained that this would be useful if she does not wish to be resuscitated in the event of cardiopulmonary arrest.  She  is in agreement, so I filled a form out for her today.  I will plan to see her back in 3 months with CBC, CMP and LDH.  If all is well, we will plan to rescan her in 3 months.    I provided 30 minutes (1:55 PM - 2:25 PM) of face-to-face time during this this encounter and > 50% was spent counseling as documented under my assessment and plan.    Derwood Kaplan, MD [facility] AT [facility] [address] [place] [address] Dept: [phone] Dept Fax: [phone]   I, Rita Ohara, am acting as scribe for Derwood Kaplan, MD  I have reviewed this report as typed by the medical scribe, and it is complete and accurate.

## 2020-01-31 ENCOUNTER — Other Ambulatory Visit: Payer: Self-pay

## 2020-01-31 ENCOUNTER — Encounter: Payer: Self-pay | Admitting: Oncology

## 2020-01-31 ENCOUNTER — Inpatient Hospital Stay (INDEPENDENT_AMBULATORY_CARE_PROVIDER_SITE_OTHER): Payer: Medicare Other | Admitting: Oncology

## 2020-01-31 ENCOUNTER — Inpatient Hospital Stay: Payer: Medicare Other | Attending: Oncology

## 2020-01-31 VITALS — BP 146/72 | HR 84 | Temp 98.4°F | Resp 18 | Ht 60.0 in | Wt 118.4 lb

## 2020-01-31 DIAGNOSIS — R970 Elevated carcinoembryonic antigen [CEA]: Secondary | ICD-10-CM | POA: Insufficient documentation

## 2020-01-31 DIAGNOSIS — Z880 Allergy status to penicillin: Secondary | ICD-10-CM | POA: Insufficient documentation

## 2020-01-31 DIAGNOSIS — R1900 Intra-abdominal and pelvic swelling, mass and lump, unspecified site: Secondary | ICD-10-CM | POA: Insufficient documentation

## 2020-01-31 DIAGNOSIS — Z882 Allergy status to sulfonamides status: Secondary | ICD-10-CM | POA: Insufficient documentation

## 2020-01-31 DIAGNOSIS — K5900 Constipation, unspecified: Secondary | ICD-10-CM | POA: Diagnosis not present

## 2020-01-31 DIAGNOSIS — R5383 Other fatigue: Secondary | ICD-10-CM | POA: Insufficient documentation

## 2020-01-31 DIAGNOSIS — R109 Unspecified abdominal pain: Secondary | ICD-10-CM | POA: Insufficient documentation

## 2020-01-31 DIAGNOSIS — R590 Localized enlarged lymph nodes: Secondary | ICD-10-CM | POA: Insufficient documentation

## 2020-01-31 DIAGNOSIS — R232 Flushing: Secondary | ICD-10-CM | POA: Diagnosis not present

## 2020-01-31 DIAGNOSIS — C8213 Follicular lymphoma grade II, intra-abdominal lymph nodes: Secondary | ICD-10-CM | POA: Diagnosis not present

## 2020-01-31 DIAGNOSIS — K921 Melena: Secondary | ICD-10-CM | POA: Diagnosis not present

## 2020-01-31 DIAGNOSIS — G14 Postpolio syndrome: Secondary | ICD-10-CM

## 2020-01-31 DIAGNOSIS — R112 Nausea with vomiting, unspecified: Secondary | ICD-10-CM | POA: Diagnosis not present

## 2020-01-31 DIAGNOSIS — Z79899 Other long term (current) drug therapy: Secondary | ICD-10-CM | POA: Diagnosis not present

## 2020-01-31 DIAGNOSIS — N6002 Solitary cyst of left breast: Secondary | ICD-10-CM | POA: Diagnosis not present

## 2020-01-31 LAB — CBC WITH DIFFERENTIAL/PLATELET
Abs Immature Granulocytes: 0.04 10*3/uL (ref 0.00–0.07)
Basophils Absolute: 0 10*3/uL (ref 0.0–0.1)
Basophils Relative: 1 %
Eosinophils Absolute: 0.1 10*3/uL (ref 0.0–0.5)
Eosinophils Relative: 1 %
HCT: 41.7 % (ref 36.0–46.0)
Hemoglobin: 13.5 g/dL (ref 12.0–15.0)
Immature Granulocytes: 1 %
Lymphocytes Relative: 14 %
Lymphs Abs: 1.2 10*3/uL (ref 0.7–4.0)
MCH: 29 pg (ref 26.0–34.0)
MCHC: 32.4 g/dL (ref 30.0–36.0)
MCV: 89.7 fL (ref 80.0–100.0)
Monocytes Absolute: 0.6 10*3/uL (ref 0.1–1.0)
Monocytes Relative: 7 %
Neutro Abs: 6.3 10*3/uL (ref 1.7–7.7)
Neutrophils Relative %: 76 %
Platelets: 204 10*3/uL (ref 150–400)
RBC: 4.65 MIL/uL (ref 3.87–5.11)
RDW: 15.9 % — ABNORMAL HIGH (ref 11.5–15.5)
WBC: 8.2 10*3/uL (ref 4.0–10.5)
nRBC: 0 % (ref 0.0–0.2)

## 2020-01-31 LAB — LACTATE DEHYDROGENASE: LDH: 180 U/L (ref 98–192)

## 2020-01-31 NOTE — ACP (Advance Care Planning) (Signed)
DNR as discussed 01/31/20

## 2020-02-02 LAB — CEA: CEA: 5.4 ng/mL — ABNORMAL HIGH (ref 0.0–4.7)

## 2020-02-13 DIAGNOSIS — M810 Age-related osteoporosis without current pathological fracture: Secondary | ICD-10-CM | POA: Diagnosis not present

## 2020-02-18 DIAGNOSIS — M79621 Pain in right upper arm: Secondary | ICD-10-CM | POA: Diagnosis not present

## 2020-02-18 DIAGNOSIS — M810 Age-related osteoporosis without current pathological fracture: Secondary | ICD-10-CM | POA: Diagnosis not present

## 2020-02-18 DIAGNOSIS — F32A Depression, unspecified: Secondary | ICD-10-CM | POA: Diagnosis not present

## 2020-02-18 DIAGNOSIS — M25511 Pain in right shoulder: Secondary | ICD-10-CM | POA: Diagnosis not present

## 2020-02-26 ENCOUNTER — Ambulatory Visit (INDEPENDENT_AMBULATORY_CARE_PROVIDER_SITE_OTHER): Payer: Medicare Other

## 2020-02-26 DIAGNOSIS — R55 Syncope and collapse: Secondary | ICD-10-CM | POA: Diagnosis not present

## 2020-02-26 LAB — CUP PACEART REMOTE DEVICE CHECK
Battery Remaining Longevity: 121 mo
Battery Remaining Percentage: 95.5 %
Battery Voltage: 3.01 V
Brady Statistic AP VP Percent: 26 %
Brady Statistic AP VS Percent: 17 %
Brady Statistic AS VP Percent: 2.4 %
Brady Statistic AS VS Percent: 54 %
Brady Statistic RA Percent Paced: 42 %
Brady Statistic RV Percent Paced: 29 %
Date Time Interrogation Session: 20211130020013
Implantable Lead Implant Date: 20190205
Implantable Lead Implant Date: 20190205
Implantable Lead Location: 753859
Implantable Lead Location: 753860
Implantable Pulse Generator Implant Date: 20190205
Lead Channel Impedance Value: 380 Ohm
Lead Channel Impedance Value: 540 Ohm
Lead Channel Pacing Threshold Amplitude: 0.5 V
Lead Channel Pacing Threshold Amplitude: 0.625 V
Lead Channel Pacing Threshold Pulse Width: 0.5 ms
Lead Channel Pacing Threshold Pulse Width: 0.5 ms
Lead Channel Sensing Intrinsic Amplitude: 2.2 mV
Lead Channel Sensing Intrinsic Amplitude: 7.2 mV
Lead Channel Setting Pacing Amplitude: 0.875
Lead Channel Setting Pacing Amplitude: 2 V
Lead Channel Setting Pacing Pulse Width: 0.5 ms
Lead Channel Setting Sensing Sensitivity: 2 mV
Pulse Gen Model: 2272
Pulse Gen Serial Number: 8993229

## 2020-03-03 NOTE — Progress Notes (Signed)
Remote pacemaker transmission.   

## 2020-03-05 DIAGNOSIS — G8929 Other chronic pain: Secondary | ICD-10-CM | POA: Diagnosis not present

## 2020-03-05 DIAGNOSIS — M25511 Pain in right shoulder: Secondary | ICD-10-CM | POA: Diagnosis not present

## 2020-03-21 DIAGNOSIS — M791 Myalgia, unspecified site: Secondary | ICD-10-CM | POA: Diagnosis not present

## 2020-03-21 DIAGNOSIS — R509 Fever, unspecified: Secondary | ICD-10-CM | POA: Diagnosis not present

## 2020-03-21 DIAGNOSIS — J029 Acute pharyngitis, unspecified: Secondary | ICD-10-CM | POA: Diagnosis not present

## 2020-03-31 ENCOUNTER — Other Ambulatory Visit: Payer: Self-pay | Admitting: *Deleted

## 2020-03-31 NOTE — Patient Outreach (Addendum)
[facility]) Care Management  03/31/2020  NAKHIA LEVITAN 1927/10/13 [id number]   Referral Received 03/27/2021 Initial Outreach 03/31/2020 Telephone Screen  RN attempted outreach however unsuccessful. RN able to leave a HIPAA approved voice message requesting a call back. Contact made to the requested son Joya Willmott [phone]).  Will send outreach letter and follow up once again in 4 days.  Elliot Cousin, RN Care Management Coordinator [facility] Main Office [phone]

## 2020-04-01 ENCOUNTER — Other Ambulatory Visit: Payer: Self-pay | Admitting: *Deleted

## 2020-04-01 NOTE — Patient Outreach (Addendum)
[facility]) Care Management  04/01/2020  Julia Hunt 06/22/1927 [id number]   Referral received 12/20 Outreach #2  Received a call back from the pt's son Julia Hunt). RN returned the call today and further engaged on pt's needs. Son is requested cleaning services to assist pt further with the pt's household cleaning. RN explained this is a services not covered by her insurance and maybe an out of pocket expense however if pt qualifies for MCD there maybe other services available to assist pt with in the home. Son states pt does not qualify for MCD due to pt's social security approximately $1400 but remains interest in any available resources. Offered to make a referral to [facility] social worker for available resources in the pt's area Administrator, arts). Caregiver receptive to a referral for this information.   Assessment screen completed and RN inquired on pt's medical issues for possible enrollment. Son indicates pt is doing well with no current issues and her conditions are well controlled. Son Julia Hunt) opt to decline [facility] nursing at this time with no further needs for this patient.   Will refer to a Child psychotherapist and notify the provider of pt's disposition with [facility] services. No further needs.  Elliot Cousin, RN Care Management Coordinator [facility] Main Office [phone]

## 2020-04-02 ENCOUNTER — Encounter: Payer: Self-pay | Admitting: *Deleted

## 2020-04-02 ENCOUNTER — Other Ambulatory Visit: Payer: Self-pay | Admitting: *Deleted

## 2020-04-02 NOTE — Patient Outreach (Signed)
[facility]) Care Management  04/02/2020  Julia Hunt June 08, 1927 [id number]   Per the request of Elliot Cousin, [facility] also with [facility] Care Management, CSW made an attempt to try and contact patient's son, Marasia Newhall today, to perform the initial phone assessment on patient, as well as to assess and assist with social work needs and services.  However, Mr. Heffner was unavailable at the time of CSW's call.  A HIPAA compliant message was left on voicemail for Mr. Swofford and CSW is currently awaiting a return call.  CSW will make a second outreach attempt within the next 3-4 business days, if a return call is not received from Mr. Ontko in the meantime.  CSW will also mail a Patient Unsuccessful Outreach Letter to patient's home, requesting that Mr. Kempen contact CSW if he is interested in receiving social work services through CSW with [facility], [facility].  Danford Bad, BSW, MSW, LCSW  Licensed Restaurant manager, fast food [facility]  Mailing [place] [address], [place], [place] [address] Physical Address[address] [place], [place] [address] Toll Free Main # [phone] Fax # [phone] Cell # [phone]  [email or web address]

## 2020-04-03 DIAGNOSIS — M810 Age-related osteoporosis without current pathological fracture: Secondary | ICD-10-CM | POA: Diagnosis not present

## 2020-04-03 DIAGNOSIS — D509 Iron deficiency anemia, unspecified: Secondary | ICD-10-CM | POA: Diagnosis not present

## 2020-04-03 DIAGNOSIS — E785 Hyperlipidemia, unspecified: Secondary | ICD-10-CM | POA: Diagnosis not present

## 2020-04-03 DIAGNOSIS — K222 Esophageal obstruction: Secondary | ICD-10-CM | POA: Diagnosis not present

## 2020-04-03 DIAGNOSIS — K219 Gastro-esophageal reflux disease without esophagitis: Secondary | ICD-10-CM | POA: Diagnosis not present

## 2020-04-03 DIAGNOSIS — R413 Other amnesia: Secondary | ICD-10-CM | POA: Diagnosis not present

## 2020-04-03 DIAGNOSIS — E039 Hypothyroidism, unspecified: Secondary | ICD-10-CM | POA: Diagnosis not present

## 2020-04-04 ENCOUNTER — Ambulatory Visit: Payer: Self-pay | Admitting: *Deleted

## 2020-04-08 ENCOUNTER — Other Ambulatory Visit: Payer: Self-pay | Admitting: *Deleted

## 2020-04-08 NOTE — Patient Outreach (Signed)
[facility] [facility]) Care Management  04/08/2020  Julia Hunt 1927-04-16 [id number]   CSW made a second attempt to try and contact patient's son, [name] today, to perform the initial phone assessment on patient, as well as to assess and assist with social work needs and services, but without success.  CSW was able to leave a HIPAA compliant message on voicemail for Mr. Shimada, and is currently awaiting a return call.  CSW will make a third outreach attempt within the next 3-4 business days, if a return call is not received from Mr. Osinski in the meantime.    Nat Christen, BSW, MSW, LCSW  Licensed Education officer, environmental [facility]  Mailing [place] [address], [place], [place] [address] Physical [address], [place], [place] [address] Toll Free Main # [phone] Fax # [phone] Cell # [phone]  [email or web address]

## 2020-04-09 ENCOUNTER — Other Ambulatory Visit: Payer: Self-pay | Admitting: *Deleted

## 2020-04-09 NOTE — Patient Outreach (Signed)
[facility]) Care Management  04/09/2020  Julia Hunt 1927/09/01 [id number]  Referral has been placed to Care Guides for assistance with obtaining community resources. Contact person is son, [name] [phone]), who can be contacted between the hours of 1:00pm - 4:00pm.  Mr. Yanes is inquiring about cleaning services and other assistance in the home for patient.  Note: patient's income guidelines have been verified and patient does not qualify for Adult Medicaid, through the [place].  CSW will sign off.  Nat Christen, BSW, MSW, LCSW  Licensed Education officer, environmental [facility]  Mailing [place] [address] [place], [place] [address] Physical Address[address] [place], [place] [address] Toll Free Main # [phone] Fax # [phone] Cell # [phone]  [email or web address]

## 2020-04-14 ENCOUNTER — Ambulatory Visit: Payer: Self-pay | Admitting: *Deleted

## 2020-04-28 ENCOUNTER — Telehealth: Payer: Self-pay | Admitting: Oncology

## 2020-04-28 NOTE — Telephone Encounter (Signed)
04/28/20 Spoke with patient and sched CT SCANS 

## 2020-04-28 NOTE — Telephone Encounter (Signed)
04/28/20 Spoke with patient and sched CT [facility]

## 2020-04-30 ENCOUNTER — Encounter: Payer: Self-pay | Admitting: Oncology

## 2020-04-30 NOTE — Progress Notes (Incomplete)
[facility]  [address] [place],  [place]  [address] [phone]  Clinic Day:  04/30/2020  Referring physician: Nicoletta Dress, MD   This document serves as a record of services personally performed by Hosie Poisson, MD. It was created on their behalf by Curry,Lauren E, a trained medical scribe. The creation of this record is based on the scribe's personal observations and the provider's statements to them.   CHIEF COMPLAINT:  CC: Low grade lymphoma  Current Treatment:  Surveillance   HISTORY OF PRESENT ILLNESS:  Julia Hunt is a 87 y.o. female with low grade lymphoma diagnosed in September 2021.  The abdominal mass was discovered by her primary care provider.  Initial presentation included abdominal pain, abdominal fullness, abdominal swelling, nausea, vomiting and constipation.   The onset was gradual and worsened over time.  CT imaging was pursued on August 17th which revealed a new left para-aortic mass 4.3 x 3.8 x 4.7 cm, favored to be an abnormal enlarged lymph node.  PET imaging confirmed this mass to be hypermetabolic measuring up to 4.9 cm in long axis with maximum SUV of 7.8.  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 There were small abnormal lymphocytes with irregular nuclear contour.  These cells stained positive for CD 20, CD10, BCL6, BCL2, and were negative for Cyclin D1, with a low Ki 67.  She did also have blood in her stool.  She is now completely asymptomatic, with no abdominal pain.  A previous CT of chest, abdomen and pelvis from 2018 did not show this abnormality.    INTERVAL HISTORY:  Julia Hunt is here for follow up and states that she is well other than fatigue, but she has attributed this to her age.  She will occasionally get hot flashes, but denies night sweats.  She states that her calcium level was mildly low at her last visit with Dr. Delena Bali.  She is  currently on oral calcium supplement twice daily as she is on Prolia, but occasionally, she will forget some doses.  We are obtaining labs today, so I will let her know the results.  Her  appetite is good, and she has lost 2 and 1/2 pounds since her last visit.  She denies fever, chills or other signs of infection.  She denies nausea, vomiting, bowel issues, or abdominal pain.  She denies sore throat, cough, dyspnea, or chest pain.  She has had her COVID vaccine booster.  Julia Hunt is here for routine follow up ***.   Her  appetite is good, and she has gained/lost _ pounds since her last visit.  She denies fever, chills or other signs of infection.  She denies nausea, vomiting, bowel issues, or abdominal pain.  She denies sore throat, cough, dyspnea, or chest pain.  REVIEW OF SYSTEMS:  Review of Systems - Oncology   VITALS:  There were no vitals taken for this visit.  Wt Readings from Last 3 Encounters:  01/31/20 118 lb 6.4 oz (53.7 kg)  12/21/19 120 lb 14.4 oz (54.8 kg)  07/03/19 115 lb (52.2 kg)    There is no height or weight on file to calculate BMI.  Performance status (ECOG): 1 - Symptomatic but completely ambulatory  PHYSICAL EXAM:  Physical Exam LABS:   CBC Latest Ref Rng & Units 01/31/2020 04/27/2017  WBC 4.0 - 10.5 K/uL 8.2 6.7  Hemoglobin 12.0 - 15.0 g/dL 13.5 12.2  Hematocrit 36.0 -  46.0 % 41.7 38.8  Platelets 150 - 400 K/uL 204 230   CMP Latest Ref Rng & Units 04/27/2017  Glucose 65 - 99 mg/dL 98  BUN 8 - 27 mg/dL 23  Creatinine 0.57 - 1.00 mg/dL 1.02(H)  Sodium 134 - 144 mmol/L 145(H)  Potassium 3.5 - 5.2 mmol/L 4.0  Chloride 96 - 106 mmol/L 107(H)  CO2 20 - 29 mmol/L 25  Calcium 8.7 - 10.3 mg/dL 9.0    Lab Results  Component Value Date   CEA1 5.4 (H) 01/31/2020   /  CEA  Date Value Ref Range Status  01/31/2020 5.4 (H) 0.0 - 4.7 ng/mL Final    Comment:    (NOTE)                             Nonsmokers          <3.9                             Smokers              <5.6 [employer] Electrochemiluminescence Immunoassay (ECLIA) Values obtained with different assay methods or kits cannot be used interchangeably.  Results cannot be interpreted as absolute evidence of the presence or absence of malignant disease. Performed At: [facility] [place], [place] [id number] Rush Farmer MD XT:[id number]    Lab Results  Component Value Date   LDH 180 01/31/2020     STUDIES:  No results found.   Allergies:  Allergies  Allergen Reactions  . Clarithromycin Rash  . Penicillins Rash    Has patient had a PCN reaction causing immediate rash, facial/tongue/throat swelling, SOB or lightheadedness with hypotension: Unknown Has patient had a PCN reaction causing severe rash involving mucus membranes or skin necrosis: Unknown Has patient had a PCN reaction that required hospitalization: No Has patient had a PCN reaction occurring within the last 10 years: No If all of the above answers are "NO", then may proceed with Cephalosporin use.   . Sulfa Antibiotics Rash    Current Medications: Current Outpatient Medications  Medication Sig Dispense Refill  . atorvastatin (LIPITOR) 40 MG tablet Take 1 tablet by mouth daily.    . Calcium Carbonate-Vitamin D (CALCIUM 600+D) 600-400 MG-UNIT tablet Take 1 tablet by mouth 2 (two) times daily.     Marland Kitchen levothyroxine (SYNTHROID, LEVOTHROID) 75 MCG tablet Take 75 mcg by mouth daily before breakfast.    . Multiple Vitamin (MULTIVITAMIN) capsule Take 1 capsule by mouth daily.    . sertraline (ZOLOFT) 100 MG tablet Take 100 mg by mouth daily.    . sodium chloride (OCEAN) 0.65 % SOLN nasal spray Place 1 spray into both nostrils daily as needed for congestion.     . traMADol (ULTRAM) 50 MG tablet Take 50 mg by mouth daily.   0   No current facility-administered medications for this visit.     ASSESSMENT & PLAN:   Assessment:  This is an elderly but fit woman despite her many comorbidities  and surgeries.  She has a low grade lymphoma and is fairly asymptomatic at this time.  After discussing the risks and benefits, we have opted for surveillance but she was instructed to call if she develops abdominal pain again.  This appears to be a stage IA. I cannot explain the mild elevation of the CEA.   Plan: We discussed that  she wishes to focus on quality of life rather than quantity.  She already has a living will in place, and asked about a DNR.  I explained that this would be useful if she does not wish to be resuscitated in the event of cardiopulmonary arrest.  She is in agreement, so I filled a form out for her today.  I will plan to see her back in 3 months with CBC, CMP and LDH.  If all is well, we will plan to rescan her in 3 months.  She understands and agrees with this plan of care.   I provided 30 minutes of face-to-face time during this this encounter and > 50% was spent counseling as documented under my assessment and plan.    Derwood Kaplan, MD [facility] AT [facility] [address] [place] [address] Dept: [phone] Dept Fax: [phone]   I, Rita Ohara, am acting as scribe for Derwood Kaplan, MD  I have reviewed this report as typed by the medical scribe, and it is complete and accurate.

## 2020-05-01 DIAGNOSIS — C8213 Follicular lymphoma grade II, intra-abdominal lymph nodes: Secondary | ICD-10-CM | POA: Diagnosis not present

## 2020-05-01 DIAGNOSIS — C8290 Follicular lymphoma, unspecified, unspecified site: Secondary | ICD-10-CM | POA: Diagnosis not present

## 2020-05-01 DIAGNOSIS — R970 Elevated carcinoembryonic antigen [CEA]: Secondary | ICD-10-CM | POA: Diagnosis not present

## 2020-05-01 NOTE — Progress Notes (Signed)
[facility]  [address] [place],  [place]  [address] [phone]  Clinic Day:  05/02/2020  Referring physician: Nicoletta Dress, MD    CHIEF COMPLAINT:  CC: A 87 year old female with a history of low grade lymphoma diagnosed in September 2021 here for 3 month evaluation and discussion of CT imaging as it relates to disease status.  Current Treatment:  Surveillance   HISTORY OF PRESENT ILLNESS:  Julia Hunt is a 87 y.o. female with low grade lymphoma diagnosed in September 2021.  The abdominal mass was discovered by her primary care provider.  Initial presentation included abdominal pain, abdominal fullness, abdominal swelling, nausea, vomiting and constipation.   The onset was gradual and worsened over time.  CT imaging was pursued on August 17th which revealed a new left para-aortic mass 4.3 x 3.8 x 4.7 cm, favored to be an abnormal enlarged lymph node.  PET imaging confirmed this mass to be hypermetabolic measuring up to 4.9 cm in long axis with maximum SUV of 7.8.  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 There were small abnormal lymphocytes with irregular nuclear contour.  These cells stained positive for CD 20, CD10, BCL6, BCL2, and were negative for Cyclin D1, with a low Ki 67.  She did also have blood in her stool.  She is now completely asymptomatic, with no abdominal pain.  A previous CT of chest, abdomen and pelvis from 2018 did not show this abnormality.     INTERVAL HISTORY:  Julia Hunt is here for follow up of her low grade lymphoma and to review her most recent CT imaging of the chest, abdomen and pelvis. Her son is present during this visit. She has no complaints today other than some difficulty with short term memory which causes her anxiety. She was evaluated by Dr. Carolanne Grumbling who did not find anything concerning, but did increase her Zoloft from once a day to twice daily. She  started that a month ago and hasn't really noticed a change in her level of anxiety. She will discuss this with Dr. Carolanne Grumbling as she has been on Zoloft for several years. She notes some fatigue if she is doing a lot of activity and noticed that she has to take more breaks when performing tasks such as washing the dishes. She attributes this to her age.  She denies fever, chills, nausea or vomiting. She denies shortness of breath, chest pain or cough. She denies issue with bowel or bladder. Her appetite is good and her weight is up two pounds since last visit. We reviewed her CBC and CMP which were unremarkable. Her CEA is elevated today from 5.4 at last visit to 6.5. We also reviewed her CT imaging which reveals stable disease.   REVIEW OF SYSTEMS:  Review of Systems  Constitutional: Positive for fatigue. Negative for appetite change, chills, diaphoresis, fever and unexpected weight change.  HENT:   Negative for hearing loss, lump/mass, mouth sores, nosebleeds, sore throat, tinnitus, trouble swallowing and voice change.   Eyes: Negative for eye problems and icterus.  Respiratory: Negative for chest tightness, cough, hemoptysis, shortness of breath and wheezing.   Cardiovascular: Negative for chest pain, leg swelling and palpitations.  Gastrointestinal: Negative for abdominal distention, abdominal pain, blood in stool, constipation, diarrhea, nausea, rectal pain and vomiting.  Endocrine: Negative for hot flashes.  Genitourinary: Negative for bladder incontinence, difficulty urinating, dyspareunia, dysuria, frequency, hematuria and nocturia.  Musculoskeletal: Negative for arthralgias, back pain, flank pain, gait problem, myalgias, neck pain and neck stiffness.  Skin: Negative for itching, rash and wound.  Neurological: Negative for dizziness, extremity weakness, gait problem, headaches, light-headedness, numbness, seizures and speech difficulty.  Hematological: Negative for adenopathy. Does not  bruise/bleed easily.  Psychiatric/Behavioral: Negative for confusion, decreased concentration, depression, sleep disturbance and suicidal ideas. The patient is nervous/anxious.      VITALS:  Blood pressure (!) 158/81, pulse 78, temperature 98.5 F (36.9 C), temperature source Oral, resp. rate 18, height 5' (1.524 m), weight 120 lb 11.2 oz (54.7 kg), SpO2 94 %.  Wt Readings from Last 3 Encounters:  05/02/20 120 lb 11.2 oz (54.7 kg)  01/31/20 118 lb 6.4 oz (53.7 kg)  12/21/19 120 lb 14.4 oz (54.8 kg)    Body mass index is 23.57 kg/m.  Performance status (ECOG): 1 - Symptomatic but completely ambulatory  PHYSICAL EXAM:  Physical Exam Constitutional:      General: She is not in acute distress.    Appearance: Normal appearance. She is normal weight. She is not ill-appearing, toxic-appearing or diaphoretic.  HENT:     Head: Normocephalic and atraumatic.     Right Ear: Tympanic membrane normal.     Left Ear: Tympanic membrane normal.     Nose: Nose normal. No congestion or rhinorrhea.     Mouth/Throat:     Mouth: Mucous membranes are moist.     Pharynx: Oropharynx is clear. No oropharyngeal exudate or posterior oropharyngeal erythema.  Eyes:     General: No scleral icterus.       Right eye: No discharge.        Left eye: No discharge.     Extraocular Movements: Extraocular movements intact.     Conjunctiva/sclera: Conjunctivae normal.     Pupils: Pupils are equal, round, and reactive to light.  Neck:     Vascular: No carotid bruit.  Cardiovascular:     Rate and Rhythm: Normal rate and regular rhythm.     Heart sounds: No murmur heard. No friction rub. No gallop.   Pulmonary:     Effort: Pulmonary effort is normal. No respiratory distress.     Breath sounds: Normal breath sounds. No stridor. No wheezing, rhonchi or rales.  Chest:     Chest wall: No tenderness.  Abdominal:     General: Abdomen is flat. Bowel sounds are normal. There is no distension.     Palpations: There is  no mass.     Tenderness: There is no abdominal tenderness. There is no right CVA tenderness, left CVA tenderness, guarding or rebound.     Hernia: No hernia is present.  Musculoskeletal:        General: No swelling, tenderness, deformity or signs of injury. Normal range of motion.     Cervical back: Normal range of motion and neck supple. No rigidity or tenderness.     Right lower leg: No edema.     Left lower leg: No edema.  Lymphadenopathy:     Cervical: No cervical adenopathy.  Skin:    General: Skin is warm and dry.     Capillary Refill: Capillary refill takes less than 2 seconds.     Coloration: Skin is not jaundiced or pale.     Findings: No bruising, erythema, lesion or rash.  Neurological:     General: No focal deficit present.     Mental Status: She is alert and oriented to person, place, and time. Mental status is  at baseline.     Cranial Nerves: No cranial nerve deficit.     Sensory: No sensory deficit.     Motor: No weakness.     Coordination: Coordination normal.     Gait: Gait normal.     Deep Tendon Reflexes: Reflexes normal.  Psychiatric:        Mood and Affect: Mood normal.        Behavior: Behavior normal.        Thought Content: Thought content normal.        Judgment: Judgment normal.    LABS:   CBC Latest Ref Rng & Units 05/02/2020 01/31/2020 04/27/2017  WBC - 7.6 8.2 6.7  Hemoglobin 12.0 - 16.0 13.1 13.5 12.2  Hematocrit 36 - 46 39 41.7 38.8  Platelets 150 - 399 184 204 230   CMP Latest Ref Rng & Units 05/02/2020 04/27/2017  Glucose 65 - 99 mg/dL - 98  BUN 4 - 21 16 23   Creatinine 0.5 - 1.1 0.9 1.02(H)  Sodium 137 - 147 138 145(H)  Potassium 3.4 - 5.3 3.9 4.0  Chloride 99 - 108 104 107(H)  CO2 13 - 22 29(A) 25  Calcium 8.7 - 10.7 9.0 9.0  Alkaline Phos 25 - 125 82 -  AST 13 - 35 27 -  ALT 7 - 35 15 -    Lab Results  Component Value Date   CEA1 6.5 05/02/2020   /  CEA  Date Value Ref Range Status  05/02/2020 6.5  Final   Lab Results   Component Value Date   LDH 180 01/31/2020     STUDIES:   Exam(s): [id number] CT/CT CHEST-ABD-PELV W/IV CM CLINICAL DATA:  Follow-up follicular lymphoma.  EXAM: CT CHEST, ABDOMEN, AND PELVIS WITH CONTRAST  TECHNIQUE: Multidetector CT imaging of the chest, abdomen and pelvis was performed following the standard protocol during bolus administration of intravenous contrast.  CONTRAST:  100 mL Isovue 370  COMPARISON:  PET-CT on 11/26/2019, and chest CT on 02/12/2017  FINDINGS: CT CHEST FINDINGS  Cardiovascular: No acute findings. Aortic and coronary atherosclerotic calcification noted. Transvenous pacemaker remains in place.  Mediastinum/Lymph Nodes: No masses or pathologically enlarged lymph nodes identified.  Lungs/Pleura: Wedge-shaped area of opacity in the anteromedial right upper lobe abutting the mediastinum remains stable since previous studies dating back to 2018, consistent with benign etiology, likely scarring. Bilateral lower lobe pleural-parenchymal scarring also noted.  10 x 8 mm pulmonary nodule in the medial left lung base on image 88/4, 14 by 8 mm pulmonary nodule in the posterior left lung base on image 92/4, and 2.6 x 1.1 cm pulmonary nodule in the medial right lung base on image 85/4 all remain stable since most recent exam, but are new since chest CT in 2018. These showed mild FDG uptake on recent PET scan. An 11 mm ground-glass nodule in the left lower lobe is new since recent study and most likely inflammatory or infectious in etiology. No evidence of pleural effusion.  Musculoskeletal:  No suspicious bone lesions identified.  CT ABDOMEN AND PELVIS FINDINGS  Hepatobiliary: No masses identified. Multiple hepatic cysts are again seen, as well as a small hemangioma in the liver dome. No suspicious hepatic masses are identified. Gallbladder is unremarkable. No evidence of biliary ductal dilatation.  Pancreas: Stable 1.5 cm simple appearing cystic  lesion in the pancreatic body on image 54/2. Otherwise unremarkable.  Spleen:  Within normal limits in size and appearance.  Adrenals/Urinary tract:  No masses or hydronephrosis.  Stomach/Bowel:  No evidence of obstruction, inflammatory process, or abnormal fluid collections. Severe diverticulosis again seen involving the sigmoid colon, however there is no evidence of diverticulitis.  Vascular/Lymphatic: Left paraaortic lymphadenopathy is again seen measuring 4.2 x 3.6 cm, without significant change since previous study. No other sites of lymphadenopathy identified. No abdominal aortic aneurysm. Aortic atherosclerotic calcification noted.  Reproductive: Prior hysterectomy noted. Adnexal regions are unremarkable in appearance.  Other:  None.  Musculoskeletal:  No suspicious bone lesions identified.  IMPRESSION: Stable left paraaortic retroperitoneal lymphadenopathy. No new or progressive lymphadenopathy within the chest, abdomen, or pelvis.  Bilateral lower lobe pulmonary nodules remain stable since most recent PET-CT, but are new since chest CT in 2018. Neoplasm cannot be excluded.  New 11 mm ground-glass nodule in left lower lobe, most likely inflammatory or infectious in etiology. Recommend continued attention on follow-up imaging.  Stable 1.5 cm simple appearing cystic lesion in pancreatic body. Recommend continued attention on follow-up imaging.  Severe sigmoid diverticulosis, without radiographic evidence of diverticulitis.  Aortic and coronary atherosclerotic calcification noted.   Electronically Signed   By: Marlaine Hind M.D.   On: 05/01/2020 13:52   Allergies:  Allergies  Allergen Reactions  . Clarithromycin Rash  . Penicillins Rash    Has patient had a PCN reaction causing immediate rash, facial/tongue/throat swelling, SOB or lightheadedness with hypotension: Unknown Has patient had a PCN reaction causing severe rash involving mucus membranes or skin  necrosis: Unknown Has patient had a PCN reaction that required hospitalization: No Has patient had a PCN reaction occurring within the last 10 years: No If all of the above answers are "NO", then may proceed with Cephalosporin use.   . Sulfa Antibiotics Rash    Current Medications: Current Outpatient Medications  Medication Sig Dispense Refill  . atorvastatin (LIPITOR) 40 MG tablet Take 1 tablet by mouth daily.    . Calcium Carbonate-Vitamin D (CALCIUM 600+D) 600-400 MG-UNIT tablet Take 1 tablet by mouth 2 (two) times daily.     Marland Kitchen levothyroxine (SYNTHROID, LEVOTHROID) 75 MCG tablet Take 75 mcg by mouth daily before breakfast.    . Multiple Vitamin (MULTIVITAMIN) capsule Take 1 capsule by mouth daily.    . sertraline (ZOLOFT) 100 MG tablet Take 100 mg by mouth daily.    . sodium chloride (OCEAN) 0.65 % SOLN nasal spray Place 1 spray into both nostrils daily as needed for congestion.     . traMADol (ULTRAM) 50 MG tablet Take 50 mg by mouth daily.   0   No current facility-administered medications for this visit.     ASSESSMENT & PLAN:   Assessment:  This is an elderly but fit woman despite her many comorbidities and surgeries.  She has a low grade lymphoma and remains asymptomatic at this time.  CT imaging reveals stable disease. CMP and CBC are unremarkable. As her CT imaging is stable, we will continue to monitor her CEA, although we can not explain the elevation.   Plan: She will continue to follow closely with her PCP, Dr. Delena Bali for her anxiety. We will plan to see her back in 5 months with repeat CBC, CMP, CEA and CT imaging of chest, abdomen and pelvis. Both she and her son verbalize understanding of and agreement to the plans discussed today. They know to call the office should any new questions or concerns arise.      Melodye Ped, NP [facility] AT [facility] [address] [place] [address] Dept:  [phone] Dept Fax: [phone]

## 2020-05-02 ENCOUNTER — Inpatient Hospital Stay: Payer: Medicare Other | Attending: Oncology | Admitting: Hematology and Oncology

## 2020-05-02 ENCOUNTER — Telehealth: Payer: Self-pay | Admitting: Hematology and Oncology

## 2020-05-02 ENCOUNTER — Encounter: Payer: Self-pay | Admitting: Hematology and Oncology

## 2020-05-02 ENCOUNTER — Other Ambulatory Visit: Payer: Self-pay

## 2020-05-02 VITALS — BP 158/81 | HR 78 | Temp 98.5°F | Resp 18 | Ht 60.0 in | Wt 120.7 lb

## 2020-05-02 DIAGNOSIS — C8213 Follicular lymphoma grade II, intra-abdominal lymph nodes: Secondary | ICD-10-CM

## 2020-05-02 LAB — CBC AND DIFFERENTIAL
HCT: 39 (ref 36–46)
Hemoglobin: 13.1 (ref 12.0–16.0)
Neutrophils Absolute: 6
Platelets: 184 (ref 150–399)
WBC: 7.6

## 2020-05-02 LAB — HEPATIC FUNCTION PANEL
ALT: 15 (ref 7–35)
AST: 27 (ref 13–35)
Alkaline Phosphatase: 82 (ref 25–125)
Bilirubin, Total: 0.5

## 2020-05-02 LAB — COMPREHENSIVE METABOLIC PANEL
Albumin: 4.1 (ref 3.5–5.0)
Calcium: 9 (ref 8.7–10.7)

## 2020-05-02 LAB — CEA: CEA: 6.5

## 2020-05-02 LAB — CBC: RBC: 4.48 (ref 3.87–5.11)

## 2020-05-02 LAB — BASIC METABOLIC PANEL
BUN: 16 (ref 4–21)
CO2: 29 — AB (ref 13–22)
Chloride: 104 (ref 99–108)
Creatinine: 0.9 (ref 0.5–1.1)
Glucose: 106
Potassium: 3.9 (ref 3.4–5.3)
Sodium: 138 (ref 137–147)

## 2020-05-02 NOTE — Telephone Encounter (Signed)
Per 2/4 los next appt sched and given to patient 

## 2020-05-05 ENCOUNTER — Telehealth: Payer: Self-pay | Admitting: Internal Medicine

## 2020-05-05 NOTE — Telephone Encounter (Signed)
Pt states she has had the pfizer covid vaccine and the booster.  She will bring her card at next visit to be scanned into char

## 2020-05-19 ENCOUNTER — Telehealth: Payer: Self-pay

## 2020-05-19 NOTE — Telephone Encounter (Signed)
Pt called to make sure it was ok for her to have the shingles injection. Her PCP encouraged her to take the shot.   I notified Melissa,NP, of above. She said that it was ok for pt to have the shingles injection. Pt notified. [phone]

## 2020-05-21 ENCOUNTER — Telehealth: Payer: Self-pay

## 2020-05-21 NOTE — Telephone Encounter (Signed)
Pt called back to make double sure it was ok for her to get the shingles shot (pt called last week, & Melissa,NP, agreed that it was fine for her to have the shingles shot) after someone told her that if you have lymphoma or decreased immunity, they shouldn't get the shot. I asked her if she was told this by a pharmacist or PCP. Pt stated, "neither, I'm just going to go ahead and call [facility] and see if I can get the shot today".

## 2020-05-22 DIAGNOSIS — M1711 Unilateral primary osteoarthritis, right knee: Secondary | ICD-10-CM | POA: Diagnosis not present

## 2020-05-27 ENCOUNTER — Ambulatory Visit (INDEPENDENT_AMBULATORY_CARE_PROVIDER_SITE_OTHER): Payer: Medicare Other

## 2020-05-27 DIAGNOSIS — I452 Bifascicular block: Secondary | ICD-10-CM

## 2020-05-27 LAB — CUP PACEART REMOTE DEVICE CHECK
Battery Remaining Longevity: 120 mo
Battery Remaining Percentage: 95.5 %
Battery Voltage: 3.01 V
Brady Statistic AP VP Percent: 24 %
Brady Statistic AP VS Percent: 18 %
Brady Statistic AS VP Percent: 2.1 %
Brady Statistic AS VS Percent: 55 %
Brady Statistic RA Percent Paced: 41 %
Brady Statistic RV Percent Paced: 26 %
Date Time Interrogation Session: 20220301020014
Implantable Lead Implant Date: 20190205
Implantable Lead Implant Date: 20190205
Implantable Lead Location: 753859
Implantable Lead Location: 753860
Implantable Pulse Generator Implant Date: 20190205
Lead Channel Impedance Value: 360 Ohm
Lead Channel Impedance Value: 540 Ohm
Lead Channel Pacing Threshold Amplitude: 0.5 V
Lead Channel Pacing Threshold Amplitude: 0.625 V
Lead Channel Pacing Threshold Pulse Width: 0.5 ms
Lead Channel Pacing Threshold Pulse Width: 0.5 ms
Lead Channel Sensing Intrinsic Amplitude: 2.3 mV
Lead Channel Sensing Intrinsic Amplitude: 7.6 mV
Lead Channel Setting Pacing Amplitude: 0.875
Lead Channel Setting Pacing Amplitude: 2 V
Lead Channel Setting Pacing Pulse Width: 0.5 ms
Lead Channel Setting Sensing Sensitivity: 2 mV
Pulse Gen Model: 2272
Pulse Gen Serial Number: 8993229

## 2020-05-29 DIAGNOSIS — Z9181 History of falling: Secondary | ICD-10-CM | POA: Diagnosis not present

## 2020-05-29 DIAGNOSIS — E785 Hyperlipidemia, unspecified: Secondary | ICD-10-CM | POA: Diagnosis not present

## 2020-05-29 DIAGNOSIS — Z0000 Encounter for general adult medical examination without abnormal findings: Secondary | ICD-10-CM | POA: Diagnosis not present

## 2020-06-04 NOTE — Progress Notes (Signed)
Remote pacemaker transmission.   

## 2020-06-13 DIAGNOSIS — R070 Pain in throat: Secondary | ICD-10-CM | POA: Diagnosis not present

## 2020-06-16 ENCOUNTER — Other Ambulatory Visit: Payer: Self-pay

## 2020-06-16 ENCOUNTER — Ambulatory Visit (INDEPENDENT_AMBULATORY_CARE_PROVIDER_SITE_OTHER): Payer: Medicare Other | Admitting: Cardiology

## 2020-06-16 ENCOUNTER — Encounter: Payer: Self-pay | Admitting: Cardiology

## 2020-06-16 VITALS — BP 134/72 | HR 75 | Ht 60.0 in | Wt 124.8 lb

## 2020-06-16 DIAGNOSIS — R55 Syncope and collapse: Secondary | ICD-10-CM
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
## 2020-06-16 NOTE — Patient Instructions (Signed)
Medication Instructions:  Your physician recommends that you continue on your current medications as directed. Please refer to the Current Medication list given to you today.  *If you need a refill on your cardiac medications before your next appointment, please call your pharmacy*   Lab Work: None ordered   Testing/Procedures: None ordered   Follow-Up: At [facility], you and your health needs are our priority.  As part of our continuing mission to provide you with exceptional heart care, we have created designated Provider Care Teams.  These Care Teams include your primary Cardiologist (physician) and Advanced Practice Providers (APPs -  Physician Assistants and Nurse Practitioners) who all work together to provide you with the care you need, when you need it.  Remote monitoring is used to monitor your Pacemaker or ICD from home. This monitoring reduces the number of office visits required to check your device to one time per year. It allows [place] to keep an eye on the functioning of your device to ensure it is working properly. You are scheduled for a device check from home on 08/26/2020. You may send your transmission at any time that day. If you have a wireless device, the transmission will be sent automatically. After your physician reviews your transmission, you will receive a postcard with your next transmission date.  Your next appointment:   1 year(s)  The format for your next appointment:   In Person  Provider:   Allegra Lai, MD   Thank you for choosing [facility]!!   Trinidad Curet, RN [phone]

## 2020-06-26 ENCOUNTER — Telehealth: Payer: Self-pay

## 2020-06-26 ENCOUNTER — Other Ambulatory Visit: Payer: Self-pay | Admitting: Hematology and Oncology

## 2020-06-26 DIAGNOSIS — C8213 Follicular lymphoma grade II, intra-abdominal lymph nodes: Secondary | ICD-10-CM

## 2020-06-26 NOTE — Telephone Encounter (Addendum)
Pt scheduled for appt tomorrow @ 1030 lab, 11a Melissa.   ----- Message from Melodye Ped, NP sent at 06/26/2020  1:10 PM EDT ----- Regarding: RE: Red, dry area on right ankle Contact: [phone] Sorry! I don't think she needs to be seen urgently, but you can get her in with me sometime. I'm wide open tomorrow. ----- Message ----- From: Dairl Ponder, RN Sent: 06/26/2020  12:34 PM EDT To: Melodye Ped, NP Subject: FW: Red, dry area on right ankle               Pt has called back. ----- Message ----- From: Dairl Ponder, RN Sent: 06/26/2020  10:10 AM EDT To: Melodye Ped, NP Subject: Red, dry area on right ankle                   Pt called, told me she had non hodgkin lymphoma. She noticed a red, dry area on her right ankle for the last week. It is not swollen. "It almost looks shrunken up". She wanted to know if we should see her?  I started to tell her to contact PCP, but thought I would run it by you first.

## 2020-06-27 ENCOUNTER — Other Ambulatory Visit: Payer: Self-pay | Admitting: Hematology and Oncology

## 2020-06-27 ENCOUNTER — Inpatient Hospital Stay (INDEPENDENT_AMBULATORY_CARE_PROVIDER_SITE_OTHER): Payer: Medicare Other | Admitting: Hematology and Oncology

## 2020-06-27 ENCOUNTER — Encounter: Payer: Self-pay | Admitting: Hematology and Oncology

## 2020-06-27 ENCOUNTER — Inpatient Hospital Stay: Payer: Medicare Other | Attending: Oncology

## 2020-06-27 ENCOUNTER — Other Ambulatory Visit: Payer: Self-pay

## 2020-06-27 VITALS — BP 136/67 | HR 77 | Temp 98.6°F | Resp 14 | Ht 60.0 in | Wt 121.2 lb

## 2020-06-27 DIAGNOSIS — M79604 Pain in right leg: Secondary | ICD-10-CM | POA: Diagnosis not present

## 2020-06-27 DIAGNOSIS — C8213 Follicular lymphoma grade II, intra-abdominal lymph nodes: Secondary | ICD-10-CM | POA: Diagnosis not present

## 2020-06-27 DIAGNOSIS — M79661 Pain in right lower leg: Secondary | ICD-10-CM | POA: Diagnosis not present

## 2020-06-27 LAB — CBC AND DIFFERENTIAL
HCT: 42 (ref 36–46)
Hemoglobin: 13.7 (ref 12.0–16.0)
Neutrophils Absolute: 4.53
Platelets: 175 (ref 150–399)
WBC: 6.2

## 2020-06-27 LAB — BASIC METABOLIC PANEL
BUN: 26 — AB (ref 4–21)
CO2: 26 — AB (ref 13–22)
Chloride: 105 (ref 99–108)
Creatinine: 0.8 (ref 0.5–1.1)
Glucose: 86
Potassium: 3.7 (ref 3.4–5.3)
Sodium: 139 (ref 137–147)

## 2020-06-27 LAB — CBC
MCV: 88 (ref 81–99)
RBC: 4.79 (ref 3.87–5.11)

## 2020-06-27 LAB — HEPATIC FUNCTION PANEL
ALT: 16 (ref 7–35)
AST: 25 (ref 13–35)
Alkaline Phosphatase: 90 (ref 25–125)
Bilirubin, Total: 0.6

## 2020-06-27 LAB — COMPREHENSIVE METABOLIC PANEL
Albumin: 4.4 (ref 3.5–5.0)
Calcium: 9 (ref 8.7–10.7)

## 2020-06-27 MED ORDER — NYSTATIN 100000 UNIT/ML MT SUSP: 5.0000 mL | Freq: Four times a day (QID) | OROMUCOSAL | 0 refills | Status: DC

## 2020-06-27 MED ORDER — CEPHALEXIN 500 MG PO CAPS: 500.0000 mg | ORAL_CAPSULE | Freq: Two times a day (BID) | ORAL | 0 refills | Status: DC

## 2020-06-27 MED ORDER — CLOTRIMAZOLE-BETAMETHASONE 1-0.05 % EX CREA: 1.0000 "application " | TOPICAL_CREAM | Freq: Two times a day (BID) | CUTANEOUS | 0 refills | Status: DC

## 2020-06-27 NOTE — Progress Notes (Signed)
[facility]  [address] [place],  [place]  [address] [phone]  Clinic Day:  07/09/2020  Referring physician: Nicoletta Dress, MD    CHIEF COMPLAINT:  CC: A 87 year old female with a history of low grade lymphoma diagnosed in September 2021 here new onset rash and swelling to leg.  Current Treatment:  Diagnostic   HISTORY OF PRESENT ILLNESS:  Julia Hunt is a 87 y.o. female with low grade lymphoma diagnosed in September 2021.  The abdominal mass was discovered by her primary care provider.  Initial presentation included abdominal pain, abdominal fullness, abdominal swelling, nausea, vomiting and constipation.   The onset was gradual and worsened over time.  CT imaging was pursued on August 17th which revealed a new left para-aortic mass 4.3 x 3.8 x 4.7 cm, favored to be an abnormal enlarged lymph node.  PET imaging confirmed this mass to be hypermetabolic measuring up to 4.9 cm in long axis with maximum SUV of 7.8.  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 There were small abnormal lymphocytes with irregular nuclear contour.  These cells stained positive for CD 20, CD10, BCL6, BCL2, and were negative for Cyclin D1, with a low Ki 67.  She did also have blood in her stool.  She is now completely asymptomatic, with no abdominal pain.  A previous CT of chest, abdomen and pelvis from 2018 did not show this abnormality.     INTERVAL HISTORY:  Julia Hunt is here for evaluation of a new red rash to her right leg. She is concerned that this is related to her lymphoma. She first noticed the rash a couple of days ago as well as some new swelling to her right leg on which she wears a leg brace related to effects of polio as a child. She states this leg is always cold to touch, but doesn't usually have any swelling. The rash is somewhat tender to touch. She denies fever, chills, nausea or vomiting. She  denies shortness of breath, chest pain or cough. She denies issue with bowel or bladder. CBC and CMP are unremarkable today.   REVIEW OF SYSTEMS:  Review of Systems  Constitutional: Negative for appetite change, chills, diaphoresis, fatigue, fever and unexpected weight change.  HENT:   Negative for hearing loss, lump/mass, mouth sores, nosebleeds, sore throat, tinnitus, trouble swallowing and voice change.   Eyes: Negative for eye problems and icterus.  Respiratory: Negative for chest tightness, cough, hemoptysis, shortness of breath and wheezing.   Cardiovascular: Negative for chest pain, leg swelling and palpitations.  Gastrointestinal: Negative for abdominal distention, abdominal pain, blood in stool, constipation, diarrhea, nausea, rectal pain and vomiting.  Endocrine: Negative for hot flashes.  Genitourinary: Negative for bladder incontinence, difficulty urinating, dyspareunia, dysuria, frequency, hematuria and nocturia.   Musculoskeletal: Negative for arthralgias, back pain, flank pain, gait problem, myalgias, neck pain and neck stiffness.  Skin: Positive for rash. Negative for itching and wound.  Neurological: Negative for dizziness, extremity weakness, gait problem, headaches, light-headedness, numbness, seizures and speech difficulty.  Hematological: Negative for adenopathy. Does not bruise/bleed easily.  Psychiatric/Behavioral: Negative for confusion, decreased concentration, depression, sleep disturbance and suicidal ideas. The patient is not nervous/anxious.      VITALS:  Blood pressure 136/67, pulse 77, temperature 98.6 F (37 C), resp. rate 14, height 5' (1.524 m), weight 121 lb 3.2 oz (55 kg), SpO2 95 %.  Wt Readings from Last 3 Encounters:  07/04/20 119 lb 12.8 oz (54.3 kg)  06/27/20 121 lb 3.2 oz (55 kg)  06/16/20 124 lb 12.8 oz (56.6 kg)    Body mass index is 23.67 kg/m.  Performance status (ECOG): 1 - Symptomatic but completely ambulatory  PHYSICAL EXAM:  Physical  Exam LABS:   CBC Latest Ref Rng & Units 06/27/2020 05/02/2020 01/31/2020  WBC - 6.2 7.6 8.2  Hemoglobin 12.0 - 16.0 13.7 13.1 13.5  Hematocrit 36 - 46 42 39 41.7  Platelets 150 - 399 175 184 204   CMP Latest Ref Rng & Units 06/27/2020 05/02/2020 04/27/2017  Glucose 65 - 99 mg/dL - - 98  BUN 4 - 21 26(A) 16 23  Creatinine 0.5 - 1.1 0.8 0.9 1.02(H)  Sodium 137 - 147 139 138 145(H)  Potassium 3.4 - 5.3 3.7 3.9 4.0  Chloride 99 - 108 105 104 107(H)  CO2 13 - 22 26(A) 29(A) 25  Calcium 8.7 - 10.7 9.0 9.0 9.0  Alkaline Phos 25 - 125 90 82 -  AST 13 - 35 25 27 -  ALT 7 - 35 16 15 -    Lab Results  Component Value Date   CEA1 6.5 05/02/2020   /  CEA  Date Value Ref Range Status  05/02/2020 6.5  Final   Lab Results  Component Value Date   LDH 180 01/31/2020     STUDIES:    Allergies:  Allergies  Allergen Reactions  . Clarithromycin Rash  . Penicillins Rash    Has patient had a PCN reaction causing immediate rash, facial/tongue/throat swelling, SOB or lightheadedness with hypotension: Unknown Has patient had a PCN reaction causing severe rash involving mucus membranes or skin necrosis: Unknown Has patient had a PCN reaction that required hospitalization: No Has patient had a PCN reaction occurring within the last 10 years: No If all of the above answers are "NO", then may proceed with Cephalosporin use.   . Sulfa Antibiotics Rash    Current Medications: Current Outpatient Medications  Medication Sig Dispense Refill  . cephALEXin (KEFLEX) 500 MG capsule Take 1 capsule (500 mg total) by mouth 2 (two) times daily. 10 capsule 0  . clotrimazole-betamethasone (LOTRISONE) cream Apply 1 application topically 2 (two) times daily. 30 g 0  . nystatin (MYCOSTATIN) 100000 UNIT/ML suspension Take 5 mLs (500,000 Units total) by mouth 4 (four) times daily. 60 mL 0  . atorvastatin (LIPITOR) 40 MG tablet Take 1 tablet by mouth daily.    . Calcium Carbonate-Vitamin D 600-400 MG-UNIT tablet Take  1 tablet by mouth 2 (two) times daily.     Marland Kitchen doxycycline (VIBRA-TABS) 100 MG tablet Take 100 mg by mouth 2 (two) times daily.    Marland Kitchen levothyroxine (SYNTHROID, LEVOTHROID) 75 MCG tablet Take 75 mcg by mouth daily before breakfast.    . Multiple Vitamin (MULTIVITAMIN) capsule Take 1 capsule by mouth daily.    . sertraline (ZOLOFT) 100 MG tablet Take 100 mg by mouth daily.    . sodium chloride (OCEAN) 0.65 % SOLN nasal spray Place 1 spray into both nostrils daily as needed for congestion.     . traMADol (ULTRAM) 50 MG tablet Take 50 mg by mouth daily.   0   No current facility-administered medications for this visit.     ASSESSMENT & PLAN:   Assessment:  Julia Hunt is a 87 y.o. female with low grade lymphoma diagnosed in September 2021. She has what appears to be a yeast like infection to her right lower  extremity. This is most likely related to the brace and extra coverings she wears on this leg. She has slight edema noted to this leg as well.   Plan: Venous doppler ultrasound was negative for DVT.  I will send in fungal cream as well as nystatin swish and spit as she feels as though she is developing thrush in her mouth. She will return to clinic in 1 week for re-assessment.      Melodye Ped, NP [facility] [address] [place] [address] Dept: [phone] Dept Fax: [phone]

## 2020-06-27 NOTE — Progress Notes (Unsigned)
Per [employer], no PA required for CPT [id number], when performed in outpatient facility.

## 2020-07-02 DIAGNOSIS — K219 Gastro-esophageal reflux disease without esophagitis: Secondary | ICD-10-CM | POA: Diagnosis not present

## 2020-07-02 DIAGNOSIS — M810 Age-related osteoporosis without current pathological fracture: Secondary | ICD-10-CM | POA: Diagnosis not present

## 2020-07-02 DIAGNOSIS — K222 Esophageal obstruction: Secondary | ICD-10-CM | POA: Diagnosis not present

## 2020-07-02 DIAGNOSIS — E039 Hypothyroidism, unspecified: Secondary | ICD-10-CM | POA: Diagnosis not present

## 2020-07-02 DIAGNOSIS — E785 Hyperlipidemia, unspecified: Secondary | ICD-10-CM | POA: Diagnosis not present

## 2020-07-02 DIAGNOSIS — D509 Iron deficiency anemia, unspecified: Secondary | ICD-10-CM | POA: Diagnosis not present

## 2020-07-03 DIAGNOSIS — M1712 Unilateral primary osteoarthritis, left knee: Secondary | ICD-10-CM | POA: Diagnosis not present

## 2020-07-04 ENCOUNTER — Inpatient Hospital Stay (INDEPENDENT_AMBULATORY_CARE_PROVIDER_SITE_OTHER): Payer: Medicare Other | Admitting: Hematology and Oncology

## 2020-07-04 ENCOUNTER — Other Ambulatory Visit: Payer: Self-pay

## 2020-07-04 VITALS — BP 147/72 | HR 78 | Temp 98.5°F | Resp 14 | Ht 60.0 in | Wt 119.8 lb

## 2020-07-04 DIAGNOSIS — C8213 Follicular lymphoma grade II, intra-abdominal lymph nodes: Secondary | ICD-10-CM

## 2020-07-04 NOTE — Progress Notes (Addendum)
[place]  [address] [place],  [place]  [address] [phone]  Clinic Day:  07/04/2020  Referring physician: Nicoletta Dress, MD    CHIEF COMPLAINT:  CC: A 87 year old female with a history of low grade lymphoma diagnosed in September 2021 here new onset rash and swelling to leg.  Current Treatment:  Diagnostic   HISTORY OF PRESENT ILLNESS:  Julia Hunt is a 87 y.o. female with low grade lymphoma diagnosed in September 2021.  The abdominal mass was discovered by her primary care provider.  Initial presentation included abdominal pain, abdominal fullness, abdominal swelling, nausea, vomiting and constipation.   The onset was gradual and worsened over time.  CT imaging was pursued on August 17th which revealed a new left para-aortic mass 4.3 x 3.8 x 4.7 cm, favored to be an abnormal enlarged lymph node.  PET imaging confirmed this mass to be hypermetabolic measuring up to 4.9 cm in long axis with maximum SUV of 7.8.  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 There were small abnormal lymphocytes with irregular nuclear contour.  These cells stained positive for CD 20, CD10, BCL6, BCL2, and were negative for Cyclin D1, with a low Ki 67.  She did also have blood in her stool.  She is now completely asymptomatic, with no abdominal pain.  A previous CT of chest, abdomen and pelvis from 2018 did not show this abnormality.     INTERVAL HISTORY:  Treniyah is here for evaluation of a yeast like rash noted to her right leg last week. We started antifungal cream at that time. Today the rash has almost completely faded. She states she is doing better since last visit. She denies fever, chills, nausea or vomiting. She denies shortness of breath, chest pain or cough. She denies issue with bowel or bladder.  REVIEW OF SYSTEMS:  Review of Systems  Constitutional: Negative for appetite change, chills,  diaphoresis, fatigue, fever and unexpected weight change.  HENT:   Negative for hearing loss, lump/mass, mouth sores, nosebleeds, sore throat, tinnitus, trouble swallowing and voice change.   Eyes: Negative for eye problems and icterus.  Respiratory: Negative for chest tightness, cough, hemoptysis, shortness of breath and wheezing.   Cardiovascular: Negative for chest pain, leg swelling and palpitations.  Gastrointestinal: Negative for abdominal distention, abdominal pain, blood in stool, constipation, diarrhea, nausea, rectal pain and vomiting.  Endocrine: Negative for hot flashes.  Genitourinary: Negative for bladder incontinence, difficulty urinating, dyspareunia, dysuria, frequency, hematuria and nocturia.   Musculoskeletal: Negative for arthralgias, back pain, flank pain, gait problem, myalgias, neck pain and neck stiffness.  Skin: Positive for rash. Negative for itching and wound.  Neurological: Negative for dizziness, extremity weakness, gait problem, headaches, light-headedness, numbness, seizures and speech difficulty.  Hematological: Negative for adenopathy. Does not bruise/bleed easily.  Psychiatric/Behavioral: Negative for confusion, decreased concentration, depression, sleep disturbance and suicidal ideas. The patient is not nervous/anxious.      VITALS:  There were no vitals taken for this visit.  Wt Readings from Last 3 Encounters:  06/27/20 121 lb 3.2 oz (55 kg)  06/16/20 124 lb 12.8 oz (56.6 kg)  05/02/20 120 lb 11.2 oz (54.7 kg)    There is no height or weight on file to calculate BMI.  Performance status (ECOG): 1 - Symptomatic but completely ambulatory  PHYSICAL EXAM:  Physical Exam Constitutional:      General: She is not in acute distress.  Appearance: Normal appearance. She is normal weight. She is not ill-appearing, toxic-appearing or diaphoretic.  HENT:     Head: Normocephalic and atraumatic.     Nose: Nose normal. No congestion or rhinorrhea.      Mouth/Throat:     Mouth: Mucous membranes are moist.     Pharynx: Oropharynx is clear. No oropharyngeal exudate or posterior oropharyngeal erythema.  Eyes:     General: No scleral icterus.       Right eye: No discharge.        Left eye: No discharge.     Extraocular Movements: Extraocular movements intact.     Conjunctiva/sclera: Conjunctivae normal.     Pupils: Pupils are equal, round, and reactive to light.  Neck:     Vascular: No carotid bruit.  Cardiovascular:     Rate and Rhythm: Normal rate and regular rhythm.     Heart sounds: No murmur heard. No friction rub. No gallop.   Pulmonary:     Effort: Pulmonary effort is normal. No respiratory distress.     Breath sounds: Normal breath sounds. No stridor. No wheezing, rhonchi or rales.  Chest:     Chest wall: No tenderness.  Abdominal:     General: Abdomen is flat. Bowel sounds are normal. There is no distension.     Palpations: There is no mass.     Tenderness: There is no abdominal tenderness. There is no right CVA tenderness, left CVA tenderness, guarding or rebound.     Hernia: No hernia is present.  Musculoskeletal:        General: No swelling, tenderness, deformity or signs of injury. Normal range of motion.     Cervical back: Normal range of motion and neck supple. No rigidity or tenderness.     Right lower leg: No edema.     Left lower leg: No edema.  Lymphadenopathy:     Cervical: No cervical adenopathy.  Skin:    General: Skin is warm and dry.     Capillary Refill: Capillary refill takes less than 2 seconds.     Coloration: Skin is not jaundiced or pale.     Findings: Rash present. No bruising, erythema or lesion.     Comments: Yeast appearing rash noted to right lower extremity has improved  Neurological:     General: No focal deficit present.     Mental Status: She is alert and oriented to person, place, and time. Mental status is at baseline.     Cranial Nerves: No cranial nerve deficit.     Sensory: No  sensory deficit.     Motor: No weakness.     Coordination: Coordination normal.     Gait: Gait normal.     Deep Tendon Reflexes: Reflexes normal.  Psychiatric:        Mood and Affect: Mood normal.        Behavior: Behavior normal.        Thought Content: Thought content normal.        Judgment: Judgment normal.    LABS:   CBC Latest Ref Rng & Units 06/27/2020 05/02/2020 01/31/2020  WBC - 6.2 7.6 8.2  Hemoglobin 12.0 - 16.0 13.7 13.1 13.5  Hematocrit 36 - 46 42 39 41.7  Platelets 150 - 399 175 184 204   CMP Latest Ref Rng & Units 06/27/2020 05/02/2020 04/27/2017  Glucose 65 - 99 mg/dL - - 98  BUN 4 - 21 26(A) 16 23  Creatinine 0.5 - 1.1 0.8 0.9 1.02(H)  Sodium 137 - 147 139 138 145(H)  Potassium 3.4 - 5.3 3.7 3.9 4.0  Chloride 99 - 108 105 104 107(H)  CO2 13 - 22 26(A) 29(A) 25  Calcium 8.7 - 10.7 9.0 9.0 9.0  Alkaline Phos 25 - 125 90 82 -  AST 13 - 35 25 27 -  ALT 7 - 35 16 15 -    Lab Results  Component Value Date   CEA1 6.5 05/02/2020   /  CEA  Date Value Ref Range Status  05/02/2020 6.5  Final   Lab Results  Component Value Date   LDH 180 01/31/2020     STUDIES:      Allergies:  Allergies  Allergen Reactions  . Clarithromycin Rash  . Penicillins Rash    Has patient had a PCN reaction causing immediate rash, facial/tongue/throat swelling, SOB or lightheadedness with hypotension: Unknown Has patient had a PCN reaction causing severe rash involving mucus membranes or skin necrosis: Unknown Has patient had a PCN reaction that required hospitalization: No Has patient had a PCN reaction occurring within the last 10 years: No If all of the above answers are "NO", then may proceed with Cephalosporin use.   . Sulfa Antibiotics Rash    Current Medications: Current Outpatient Medications  Medication Sig Dispense Refill  . atorvastatin (LIPITOR) 40 MG tablet Take 1 tablet by mouth daily.    . Calcium Carbonate-Vitamin D 600-400 MG-UNIT tablet Take 1 tablet by  mouth 2 (two) times daily.     . cephALEXin (KEFLEX) 500 MG capsule Take 1 capsule (500 mg total) by mouth 2 (two) times daily. 10 capsule 0  . clotrimazole-betamethasone (LOTRISONE) cream Apply 1 application topically 2 (two) times daily. 30 g 0  . doxycycline (VIBRA-TABS) 100 MG tablet Take 100 mg by mouth 2 (two) times daily.    Marland Kitchen levothyroxine (SYNTHROID, LEVOTHROID) 75 MCG tablet Take 75 mcg by mouth daily before breakfast.    . Multiple Vitamin (MULTIVITAMIN) capsule Take 1 capsule by mouth daily.    Marland Kitchen nystatin (MYCOSTATIN) 100000 UNIT/ML suspension Take 5 mLs (500,000 Units total) by mouth 4 (four) times daily. 60 mL 0  . sertraline (ZOLOFT) 100 MG tablet Take 100 mg by mouth daily.    . sodium chloride (OCEAN) 0.65 % SOLN nasal spray Place 1 spray into both nostrils daily as needed for congestion.     . traMADol (ULTRAM) 50 MG tablet Take 50 mg by mouth daily.   0   No current facility-administered medications for this visit.     ASSESSMENT & PLAN:   Assessment:  REANNON CANDELLA is a 87 y.o. female with low grade lymphoma diagnosed in September 2021. The yeast infection that was noted last week has almost completely resolved.    Plan: She will keep her scheduled appointment.    She verbalizes understanding of and agreement to the plans discussed today. She knows to call the office should any new questions or concerns arise.     Melodye Ped, NP [facility] [address] [place] [address] Dept: [phone] Dept Fax: [phone]

## 2020-07-09 ENCOUNTER — Encounter: Payer: Self-pay | Admitting: Hematology and Oncology

## 2020-07-15 DIAGNOSIS — M546 Pain in thoracic spine: Secondary | ICD-10-CM | POA: Diagnosis not present

## 2020-07-17 DIAGNOSIS — S32010A Wedge compression fracture of first lumbar vertebra, initial encounter for closed fracture: Secondary | ICD-10-CM | POA: Diagnosis not present

## 2020-07-17 DIAGNOSIS — S22080A Wedge compression fracture of T11-T12 vertebra, initial encounter for closed fracture: Secondary | ICD-10-CM | POA: Diagnosis not present

## 2020-07-21 DIAGNOSIS — F32A Depression, unspecified: Secondary | ICD-10-CM | POA: Diagnosis not present

## 2020-07-21 DIAGNOSIS — E785 Hyperlipidemia, unspecified: Secondary | ICD-10-CM | POA: Diagnosis not present

## 2020-07-21 DIAGNOSIS — E7800 Pure hypercholesterolemia, unspecified: Secondary | ICD-10-CM | POA: Diagnosis not present

## 2020-07-21 DIAGNOSIS — Z8673 Personal history of transient ischemic attack (TIA), and cerebral infarction without residual deficits: Secondary | ICD-10-CM | POA: Diagnosis not present

## 2020-07-21 DIAGNOSIS — E039 Hypothyroidism, unspecified: Secondary | ICD-10-CM | POA: Diagnosis not present

## 2020-07-21 DIAGNOSIS — M8008XA Age-related osteoporosis with current pathological fracture, vertebra(e), initial encounter for fracture: Secondary | ICD-10-CM | POA: Diagnosis not present

## 2020-07-21 DIAGNOSIS — K5790 Diverticulosis of intestine, part unspecified, without perforation or abscess without bleeding: Secondary | ICD-10-CM | POA: Diagnosis not present

## 2020-07-21 DIAGNOSIS — Z950 Presence of cardiac pacemaker: Secondary | ICD-10-CM | POA: Diagnosis not present

## 2020-07-21 DIAGNOSIS — I10 Essential (primary) hypertension: Secondary | ICD-10-CM | POA: Diagnosis not present

## 2020-07-21 DIAGNOSIS — C8590 Non-Hodgkin lymphoma, unspecified, unspecified site: Secondary | ICD-10-CM | POA: Diagnosis not present

## 2020-07-21 DIAGNOSIS — K219 Gastro-esophageal reflux disease without esophagitis: Secondary | ICD-10-CM | POA: Diagnosis not present

## 2020-07-21 DIAGNOSIS — M810 Age-related osteoporosis without current pathological fracture: Secondary | ICD-10-CM | POA: Diagnosis not present

## 2020-07-21 DIAGNOSIS — S22060A Wedge compression fracture of T7-T8 vertebra, initial encounter for closed fracture: Secondary | ICD-10-CM | POA: Diagnosis not present

## 2020-08-04 DIAGNOSIS — S32010A Wedge compression fracture of first lumbar vertebra, initial encounter for closed fracture: Secondary | ICD-10-CM | POA: Diagnosis not present

## 2020-08-04 DIAGNOSIS — S22080A Wedge compression fracture of T11-T12 vertebra, initial encounter for closed fracture: Secondary | ICD-10-CM | POA: Diagnosis not present

## 2020-08-12 ENCOUNTER — Other Ambulatory Visit: Payer: Self-pay | Admitting: Hematology and Oncology

## 2020-08-12 MED ORDER — FLUCONAZOLE 150 MG PO TABS: 150.0000 mg | ORAL_TABLET | Freq: Every day | ORAL | 3 refills | Status: DC

## 2020-08-26 ENCOUNTER — Ambulatory Visit (INDEPENDENT_AMBULATORY_CARE_PROVIDER_SITE_OTHER): Payer: Medicare Other

## 2020-08-26 DIAGNOSIS — I452 Bifascicular block: Secondary | ICD-10-CM

## 2020-08-27 LAB — CUP PACEART REMOTE DEVICE CHECK
Battery Remaining Longevity: 121 mo
Battery Remaining Percentage: 95.5 %
Battery Voltage: 3.01 V
Brady Statistic AP VP Percent: 20 %
Brady Statistic AP VS Percent: 18 %
Brady Statistic AS VP Percent: 2.2 %
Brady Statistic AS VS Percent: 60 %
Brady Statistic RA Percent Paced: 37 %
Brady Statistic RV Percent Paced: 23 %
Date Time Interrogation Session: 20220531025119
Implantable Lead Implant Date: 20190205
Implantable Lead Implant Date: 20190205
Implantable Lead Location: 753859
Implantable Lead Location: 753860
Implantable Pulse Generator Implant Date: 20190205
Lead Channel Impedance Value: 350 Ohm
Lead Channel Impedance Value: 540 Ohm
Lead Channel Pacing Threshold Amplitude: 0.5 V
Lead Channel Pacing Threshold Amplitude: 0.625 V
Lead Channel Pacing Threshold Pulse Width: 0.5 ms
Lead Channel Pacing Threshold Pulse Width: 0.5 ms
Lead Channel Sensing Intrinsic Amplitude: 2.4 mV
Lead Channel Sensing Intrinsic Amplitude: 6.8 mV
Lead Channel Setting Pacing Amplitude: 0.875
Lead Channel Setting Pacing Amplitude: 2 V
Lead Channel Setting Pacing Pulse Width: 0.5 ms
Lead Channel Setting Sensing Sensitivity: 2 mV
Pulse Gen Model: 2272
Pulse Gen Serial Number: 8993229

## 2020-09-01 DIAGNOSIS — J392 Other diseases of pharynx: Secondary | ICD-10-CM | POA: Diagnosis not present

## 2020-09-01 DIAGNOSIS — H60312 Diffuse otitis externa, left ear: Secondary | ICD-10-CM | POA: Diagnosis not present

## 2020-09-11 DIAGNOSIS — K219 Gastro-esophageal reflux disease without esophagitis: Secondary | ICD-10-CM | POA: Diagnosis not present

## 2020-09-17 NOTE — Progress Notes (Signed)
Remote pacemaker transmission.   

## 2020-09-23 ENCOUNTER — Other Ambulatory Visit: Payer: Self-pay | Admitting: Hematology and Oncology

## 2020-09-23 DIAGNOSIS — S22080A Wedge compression fracture of T11-T12 vertebra, initial encounter for closed fracture: Secondary | ICD-10-CM | POA: Diagnosis not present

## 2020-09-23 DIAGNOSIS — S32010A Wedge compression fracture of first lumbar vertebra, initial encounter for closed fracture: Secondary | ICD-10-CM | POA: Diagnosis not present

## 2020-10-01 ENCOUNTER — Encounter: Payer: Self-pay | Admitting: Hematology and Oncology

## 2020-10-01 ENCOUNTER — Inpatient Hospital Stay: Payer: Medicare Other | Attending: Oncology | Admitting: Hematology and Oncology

## 2020-10-01 ENCOUNTER — Other Ambulatory Visit: Payer: Self-pay

## 2020-10-01 ENCOUNTER — Inpatient Hospital Stay: Payer: Medicare Other

## 2020-10-01 VITALS — BP 133/71 | HR 86 | Temp 98.2°F | Resp 18 | Ht 60.0 in | Wt 117.1 lb

## 2020-10-01 DIAGNOSIS — R071 Chest pain on breathing: Secondary | ICD-10-CM | POA: Diagnosis not present

## 2020-10-01 DIAGNOSIS — R970 Elevated carcinoembryonic antigen [CEA]: Secondary | ICD-10-CM | POA: Insufficient documentation

## 2020-10-01 DIAGNOSIS — C8213 Follicular lymphoma grade II, intra-abdominal lymph nodes: Secondary | ICD-10-CM | POA: Diagnosis not present

## 2020-10-01 DIAGNOSIS — J9811 Atelectasis: Secondary | ICD-10-CM | POA: Diagnosis not present

## 2020-10-01 DIAGNOSIS — R918 Other nonspecific abnormal finding of lung field: Secondary | ICD-10-CM | POA: Diagnosis not present

## 2020-10-01 DIAGNOSIS — R0602 Shortness of breath: Secondary | ICD-10-CM | POA: Diagnosis not present

## 2020-10-01 DIAGNOSIS — R911 Solitary pulmonary nodule: Secondary | ICD-10-CM | POA: Diagnosis not present

## 2020-10-01 LAB — CBC AND DIFFERENTIAL
HCT: 40 (ref 36–46)
Hemoglobin: 13.1 (ref 12.0–16.0)
Neutrophils Absolute: 4.96
Platelets: 200 (ref 150–399)
WBC: 6.8

## 2020-10-01 LAB — BASIC METABOLIC PANEL
BUN: 23 — AB (ref 4–21)
CO2: 27 — AB (ref 13–22)
Chloride: 104 (ref 99–108)
Creatinine: 1 (ref 0.5–1.1)
Glucose: 99
Potassium: 4 (ref 3.4–5.3)
Sodium: 140 (ref 137–147)

## 2020-10-01 LAB — HEPATIC FUNCTION PANEL
ALT: 14 (ref 7–35)
AST: 25 (ref 13–35)
Alkaline Phosphatase: 90 (ref 25–125)
Bilirubin, Total: 0.5

## 2020-10-01 LAB — CBC: RBC: 4.57 (ref 3.87–5.11)

## 2020-10-01 LAB — COMPREHENSIVE METABOLIC PANEL
Albumin: 4.5 (ref 3.5–5.0)
Calcium: 9.4 (ref 8.7–10.7)

## 2020-10-01 LAB — LACTATE DEHYDROGENASE: LDH: 549 (ref 313–618)
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
## 2020-10-02 ENCOUNTER — Encounter: Payer: Self-pay | Admitting: Hematology and Oncology

## 2020-10-03 LAB — CEA: CEA: 5.3 ng/mL — ABNORMAL HIGH (ref 0.0–4.7)

## 2020-10-06 DIAGNOSIS — D509 Iron deficiency anemia, unspecified: Secondary | ICD-10-CM | POA: Diagnosis not present

## 2020-10-06 DIAGNOSIS — M810 Age-related osteoporosis without current pathological fracture: Secondary | ICD-10-CM | POA: Diagnosis not present

## 2020-10-06 DIAGNOSIS — E785 Hyperlipidemia, unspecified: Secondary | ICD-10-CM | POA: Diagnosis not present

## 2020-10-06 DIAGNOSIS — E039 Hypothyroidism, unspecified: Secondary | ICD-10-CM | POA: Diagnosis not present

## 2020-10-06 DIAGNOSIS — M5137 Other intervertebral disc degeneration, lumbosacral region: Secondary | ICD-10-CM | POA: Diagnosis not present

## 2020-10-06 DIAGNOSIS — K219 Gastro-esophageal reflux disease without esophagitis: Secondary | ICD-10-CM | POA: Diagnosis not present

## 2020-10-06 DIAGNOSIS — K222 Esophageal obstruction: Secondary | ICD-10-CM | POA: Diagnosis not present

## 2020-10-08 ENCOUNTER — Encounter: Payer: Self-pay | Admitting: Hematology and Oncology

## 2020-10-09 NOTE — Progress Notes (Signed)
[place]  [address] [employer] [place],  [place]  [address] [phone]  Clinic Day:  10/14/2020  Referring physician: Nicoletta Dress, MD  This document serves as a record of services personally performed by Hosie Poisson, MD. It was created on their behalf by [facility], a trained medical scribe. The creation of this record is based on the scribe's personal observations and the provider's statements to them.  CHIEF COMPLAINT:  CC:  Follicular lymphoma  Current Treatment:  Observation   HISTORY OF PRESENT ILLNESS:  TIMOTHEA BODENHEIMER is a 87 y.o. female with a low grade follicular lymphoma diagnosed in September 2021.  The abdominal mass was discovered by her primary care provider.  Initial presentation included abdominal pain, abdominal fullness, abdominal swelling, nausea, vomiting and constipation.   The onset was gradual and worsened over time.  CT imaging in August 2021 revealed a new left para-aortic mass 4.3 x 3.8 x 4.7 cm, favored to be an abnormal enlarged lymph node.  PET imaging confirmed this mass to be hypermetabolic measuring up to 4.9 cm in long axis with maximum SUV of 7.8. PET also revealed bilateral lower lobe pulmonary nodules which were new from CT chest in 2018 , but not hypermetabolic.  A stable 1.5 cm cystic lesion in the pancreatic body was also seen.  Diverticulosis without diverticulitis was noted. She underwent a CT guided biopsy in September.  Pathology revealed follicular lymphoma, low grade (1-2). There were small abnormal lymphocytes with irregular nuclear contour.  These cells stained positive for CD 20, CD10, BCL6, BCL2, and were negative for Cyclin D1, with a low Ki 67. She was  asymptomatic by the time we began seeing her in September, with no abdominal pain. We therefore recommended observation. CT chest, abdomen and pelvis in 2018 did not show lymphadenopathy.  Repeat CT chest, abdomen and pelvis in February revealed  stable left para-aortic retroperitoneal adenopathy, as well as stable bilateral lower lobe pulmonary nodules.  There was a new 11 mm ground-glass nodule in the left lower lobe felt to be inflammatory or infectious, but attention on follow-up was recommended.  There is a stable 1.5 cm simple appearing cystic lesion in the pancreatic body.  Severe sigmoid diverticulosis without evidence of diverticulitis was seen.  She was seen in April with a yeast infection of the right lower extremity, felt to be related to her leg brace. This was treated with a topical antifungal.  She also had new swelling of the right lower extremity. Venous Doppler ultrasound did not reveal any evidence of deep venous thrombosis.  At her visit in early July she reported cough, dyspnea with exertion and pleuritic type chest pain concerning for possible pneumonia versus pulmonary embolism.  Since she is at increased risk for thrombosis given her malignancy, a stat CTA chest was obtained.  This revealed no evidence of pulmonary embolism or pneumonia.  There was an increase in the bilateral pulmonary nodules. However, her upper abdominal adenopathy is stable to slightly improved.    INTERVAL HISTORY:  Novelle is here for follow up and states that she continues to have back pain from multiple compression fractures.  She has had kyphoplasty on multiple occasions.  She is scheduled for 2 CT scans tomorrow, CT thoracic spine, and CT abdomen and pelvis.  She does not like to take pain medication unless absolutely necessary, and has mainly been using Tylenol.  She notes some shortness of breath due to her pain, as she cannot take  a deep breath due to this.  She does have relief when lying flat.  She also notes productive cough with yellow sputum.  She has had some lower abdominal bloating.  Blood counts and chemistries are unremarkable except for a BUN of 21.  Her  appetite is good, and her weight is stable since her last visit.  She denies fever,  chills or other signs of infection.  She denies nausea, vomiting, bowel issues, or abdominal pain.  She denies sore throat, dyspnea, or chest pain.  Her son was here with her and we reviewed the CT images in detail.  I feel overall, there is very little change in her adenopathy and I do not feel that this is the cause of her symptoms.  We will therefore continue watchful waiting.  REVIEW OF SYSTEMS:  Review of Systems  Constitutional: Negative.  Negative for appetite change, chills, fatigue, fever and unexpected weight change.  HENT:  Negative.    Eyes: Negative.   Respiratory:  Positive for cough (productive with yellow sputum) and shortness of breath (cannot take a deep breath due to her back pain). Negative for chest tightness, hemoptysis and wheezing.   Cardiovascular: Negative.  Negative for chest pain, leg swelling and palpitations.  Gastrointestinal:  Positive for abdominal distention (lower abdominal bloating). Negative for abdominal pain, blood in stool, constipation, diarrhea, nausea and vomiting.  Endocrine: Negative.   Genitourinary: Negative.  Negative for difficulty urinating, dysuria, frequency and hematuria.   Musculoskeletal:  Positive for back pain (due to multiple compression fractures). Negative for arthralgias, flank pain, gait problem and myalgias.  Skin: Negative.   Neurological: Negative.  Negative for dizziness, extremity weakness, gait problem, headaches, light-headedness, numbness, seizures and speech difficulty.  Hematological: Negative.   Psychiatric/Behavioral: Negative.  Negative for depression and sleep disturbance. The patient is not nervous/anxious.     VITALS:  Blood pressure (!) 128/58, pulse 80, temperature 98.7 F (37.1 C), temperature source Oral, resp. rate 14, height 5' (1.524 m), weight 117 lb 9.6 oz (53.3 kg), SpO2 93 %.  Wt Readings from Last 3 Encounters:  10/14/20 117 lb 9.6 oz (53.3 kg)  10/01/20 117 lb 1.6 oz (53.1 kg)  07/04/20 119 lb 12.8 oz  (54.3 kg)    Body mass index is 22.97 kg/m.  Performance status (ECOG): 1 - Symptomatic but completely ambulatory  PHYSICAL EXAM:  Physical Exam Constitutional:      General: She is not in acute distress.    Appearance: Normal appearance. She is normal weight.  HENT:     Head: Normocephalic and atraumatic.  Eyes:     General: No scleral icterus.    Extraocular Movements: Extraocular movements intact.     Conjunctiva/sclera: Conjunctivae normal.     Pupils: Pupils are equal, round, and reactive to light.  Cardiovascular:     Rate and Rhythm: Normal rate and regular rhythm.     Pulses: Normal pulses.     Heart sounds: Normal heart sounds. No murmur heard.   No friction rub. No gallop.  Pulmonary:     Effort: Pulmonary effort is normal. No respiratory distress.     Breath sounds: Normal breath sounds.  Abdominal:     General: Bowel sounds are normal. There is no distension.     Palpations: Abdomen is soft. There is no hepatomegaly, splenomegaly or mass.     Tenderness: There is no abdominal tenderness.  Musculoskeletal:        General: Normal range of motion.  Cervical back: Normal range of motion and neck supple.     Right lower leg: No edema.     Left lower leg: No edema.     Comments: She has a brace in place on the right leg.  Lymphadenopathy:     Cervical: No cervical adenopathy.  Skin:    General: Skin is warm and dry.  Neurological:     General: No focal deficit present.     Mental Status: She is alert and oriented to person, place, and time. Mental status is at baseline.  Psychiatric:        Mood and Affect: Mood normal.        Behavior: Behavior normal.        Thought Content: Thought content normal.        Judgment: Judgment normal.    LABS:   CBC Latest Ref Rng & Units 10/14/2020 10/01/2020 06/27/2020  WBC - 7.0 6.8 6.2  Hemoglobin 12.0 - 16.0 12.8 13.1 13.7  Hematocrit 36 - 46 39 40 42  Platelets 150 - 399 192 200 175   CMP Latest Ref Rng & Units  10/14/2020 10/01/2020 06/27/2020  Glucose 65 - 99 mg/dL - - -  BUN 4 - 21 21 23(A) 26(A)  Creatinine 0.5 - 1.1 0.8 1.0 0.8  Sodium 137 - 147 141 140 139  Potassium 3.4 - 5.3 4.0 4.0 3.7  Chloride 99 - 108 104 104 105  CO2 13 - 22 29(A) 27(A) 26(A)  Calcium 8.7 - 10.7 9.5 9.4 9.0  Alkaline Phos 25 - 125 86 90 90  AST 13 - 35 29 25 25   ALT 7 - 35 18 14 16      Lab Results  Component Value Date   CEA1 5.3 (H) 10/01/2020   /  CEA  Date Value Ref Range Status  10/01/2020 5.3 (H) 0.0 - 4.7 ng/mL Final    Comment:    (NOTE)                             Nonsmokers          <3.9                             Smokers             <5.6 [employer] Electrochemiluminescence Immunoassay (ECLIA) Values obtained with different assay methods or kits cannot be used interchangeably.  Results cannot be interpreted as absolute evidence of the presence or absence of malignant disease. Performed At: [facility] [place], [place] [id number] Rush Farmer MD PP:[id number]     Lab Results  Component Value Date   LDH 549 10/01/2020   LDH 180 01/31/2020    STUDIES:  No results found.   EXAM: 10/01/2020 CT ANGIOGRAPHY CHEST WITH CONTRAST  COMPARISON:  May 01, 2020.  FINDINGS: Cardiovascular: Heart size remains enlarged with 2 vessel select 2 vessel dual lead pacer device in place.  Aortic caliber of the ascending thoracic aorta at 3.9 cm. 3.3 cm caliber of the proximal aortic arch.  Adequate opacification of central pulmonary vasculature. Density of main pulmonary artery at 4 and 29 Hounsfield units. No sign of acute pulmonary embolism. No eccentric filling defects or other finding such as webs within the pulmonary vascular bed to suggest chronic emboli. Main pulmonary artery at 2.6 cm.  Mediastinum/Nodes: No discrete adenopathy in the mediastinum. No  axillary, internal mammary or hilar lymphadenopathy. No thoracic inlet lymphadenopathy.  Lungs/Pleura:  In the medial RIGHT upper lobe on image 53 of series 5 is a 2.5 x 1.6 cm area of nodularity that previously measured 2.4 x 1.5 cm. Basilar volume loss bilaterally. Signs of mild ground-glass and potential air trapping at the LEFT lung base showing a similar appearance.  Juxtapleural mass in the RIGHT inferior chest along the medial RIGHT lung base in the RIGHT lower lobe, costodiaphragmatic sulcus measuring 3.0 x 1.6 cm previously 2.5 x 1.1 cm. Airways is are patent.  LEFT lower lobe nodule at 16 x 10 mm (image 69/5) previously 10 x 8 mm.  LEFT lower lobe juxta diaphragmatic pulmonary nodule on image 72/5 13 mm previously approximately 14 mm.  Upper Abdomen: Bulky adenopathy in the upper abdomen measuring 4.2 x 2.9 cm (image 107/4) previously approximately 4.5 x 3.5 cm m this encases LEFT renal vasculature.  Hepatic cysts and low-density hepatic lesions are unchanged. Spleen with grossly stable size is incompletely imaged. No acute upper abdominal findings to the extent evaluated.  Musculoskeletal: Multilevel compression fractures in the thoracic spine of occurred in the interval. T8 with interval cement augmentation and approximately 50% loss of height T5 and T6 with approximately 40-50% loss of height with superior endplate concavity and no posterior retropulsion of fracture elements along posterior cortex  Review of the MIP images confirms the above findings.  IMPRESSION: 1. No evidence of acute pulmonary embolism. No findings to suggest chronic pulmonary emboli. 2. Cardiac enlargement as before. 3. 3.2 cm proximal aortic arch, attention on follow-up. 4. Enlargement of pulmonary nodules and pleural based lesion in the RIGHT and LEFT lung base as described. PET scan may be helpful for further evaluation. Findings are concerning for worsening of disease. 5. Basilar atelectasis and potential air trapping. 6. Interval cement augmentation of the midthoracic  compression fracture with new compression fractures noted at the T5 and T6 level since prior imaging. 7. Slight interval decrease in size of upper abdominal lymphadenopathy. 8.  Atherosclerosis. 9. These results will be called to the ordering clinician or representative by the Radiologist Assistant, and communication documented in the PACS or [employer].  Aortic Atherosclerosis (ICD10-I70.0).  HISTORY:   Allergies:  Allergies  Allergen Reactions   Clarithromycin Rash   Penicillins Rash    Has patient had a PCN reaction causing immediate rash, facial/tongue/throat swelling, SOB or lightheadedness with hypotension: Unknown Has patient had a PCN reaction causing severe rash involving mucus membranes or skin necrosis: Unknown Has patient had a PCN reaction that required hospitalization: No Has patient had a PCN reaction occurring within the last 10 years: No If all of the above answers are "NO", then may proceed with Cephalosporin use.    Sulfa Antibiotics Rash    Current Medications: Current Outpatient Medications  Medication Sig Dispense Refill   Calcium Carbonate-Vitamin D 600-400 MG-UNIT tablet Take 1 tablet by mouth 2 (two) times daily.      levothyroxine (SYNTHROID, LEVOTHROID) 75 MCG tablet Take 75 mcg by mouth daily before breakfast.     Multiple Vitamin (MULTIVITAMIN) capsule Take 1 capsule by mouth daily.     omeprazole (PRILOSEC) 20 MG capsule Take 20 mg by mouth 2 (two) times daily.     sertraline (ZOLOFT) 100 MG tablet Take 100 mg by mouth daily.     traMADol (ULTRAM) 50 MG tablet Take 50 mg by mouth daily.   0   atorvastatin (LIPITOR) 40 MG tablet Take 1 tablet  by mouth daily.     cephALEXin (KEFLEX) 500 MG capsule Take 1 capsule (500 mg total) by mouth 2 (two) times daily. 10 capsule 0   clotrimazole-betamethasone (LOTRISONE) cream Apply 1 application topically 2 (two) times daily. 30 g 0   doxycycline (VIBRA-TABS) 100 MG tablet Take 100 mg by mouth 2 (two)  times daily.     fluconazole (DIFLUCAN) 150 MG tablet Take 1 tablet (150 mg total) by mouth daily. Repeat in 3 days if symptoms persist 2 tablet 3   nystatin (MYCOSTATIN) 100000 UNIT/ML suspension Take 5 mLs (500,000 Units total) by mouth 4 (four) times daily. 60 mL 0   PROLIA 60 MG/ML SOSY injection Inject 60 mg into the skin every 6 (six) months.     sodium chloride (OCEAN) 0.65 % SOLN nasal spray Place 1 spray into both nostrils daily as needed for congestion.  (Patient not taking: Reported on 10/14/2020)     No current facility-administered medications for this visit.     ASSESSMENT & PLAN:   Assessment/Plan:  CHRISTANA ANGELICA is a 87 y.o. female with stage IA follicular lymphoma.  I reviewed her recent CTA results and images with her and her family today.  I think the lung nodules are not much changed, and not likely a source of symptoms, so in regards to her lymphoma, we will continue surveillance.  She continues to have pain from multiple compression fractures of the spine and is following with Dr. Donivan Scull.  She is on Prolia.  She is scheduled for CT of the thoracic spine tomorrow and we have ordered CT abdomen and pelvis  for tomorrow as well.  We will see her back in 3 months with CBC, CMP and LDH for repeat evaluation.  I will plan to see her back in 6 months with CBC, CMP, LDH and CT chest, abdomen and pelvis.  We have decided not to pursue a PET scan since it would not change our recommendations. The patient and her family understand the plans discussed today and are in agreement with them.  They know to contact our office if she develops concerns prior to her next appointment.   I provided 30 minutes of face-to-face time during this encounter and > 50% was spent counseling as documented under my assessment and plan.    I, Rita Ohara, am acting as scribe for Derwood Kaplan, MD  I have reviewed this report as typed by the medical scribe, and it is complete and accurate.

## 2020-10-14 ENCOUNTER — Encounter: Payer: Self-pay | Admitting: Oncology

## 2020-10-14 ENCOUNTER — Other Ambulatory Visit: Payer: Self-pay

## 2020-10-14 ENCOUNTER — Telehealth: Payer: Self-pay | Admitting: Oncology

## 2020-10-14 ENCOUNTER — Inpatient Hospital Stay (INDEPENDENT_AMBULATORY_CARE_PROVIDER_SITE_OTHER): Payer: Medicare Other | Admitting: Oncology

## 2020-10-14 ENCOUNTER — Inpatient Hospital Stay: Payer: Medicare Other

## 2020-10-14 VITALS — BP 128/58 | HR 80 | Temp 98.7°F | Resp 14 | Ht 60.0 in | Wt 117.6 lb

## 2020-10-14 DIAGNOSIS — C8213 Follicular lymphoma grade II, intra-abdominal lymph nodes: Secondary | ICD-10-CM

## 2020-10-14 LAB — COMPREHENSIVE METABOLIC PANEL
Albumin: 4.3 (ref 3.5–5.0)
Calcium: 9.5 (ref 8.7–10.7)

## 2020-10-14 LAB — BASIC METABOLIC PANEL
BUN: 21 (ref 4–21)
CO2: 29 — AB (ref 13–22)
Chloride: 104 (ref 99–108)
Creatinine: 0.8 (ref 0.5–1.1)
Glucose: 83
Potassium: 4 (ref 3.4–5.3)
Sodium: 141 (ref 137–147)

## 2020-10-14 LAB — CBC AND DIFFERENTIAL
HCT: 39 (ref 36–46)
Hemoglobin: 12.8 (ref 12.0–16.0)
Neutrophils Absolute: 5.04
Platelets: 192 (ref 150–399)
WBC: 7

## 2020-10-14 LAB — CBC: RBC: 4.48 (ref 3.87–5.11)

## 2020-10-14 LAB — HEPATIC FUNCTION PANEL
ALT: 18 (ref 7–35)
AST: 29 (ref 13–35)
Alkaline Phosphatase: 86 (ref 25–125)
Bilirubin, Total: 0.6

## 2020-10-14 NOTE — Telephone Encounter (Signed)
Per 7/19 los next appt scheduled and given to patient 

## 2020-10-15 DIAGNOSIS — K5730 Diverticulosis of large intestine without perforation or abscess without bleeding: Secondary | ICD-10-CM | POA: Diagnosis not present

## 2020-10-15 DIAGNOSIS — Z87442 Personal history of urinary calculi: Secondary | ICD-10-CM | POA: Diagnosis not present

## 2020-10-15 DIAGNOSIS — C8313 Mantle cell lymphoma, intra-abdominal lymph nodes: Secondary | ICD-10-CM | POA: Diagnosis not present

## 2020-10-15 DIAGNOSIS — M546 Pain in thoracic spine: Secondary | ICD-10-CM | POA: Diagnosis not present

## 2020-10-15 DIAGNOSIS — K862 Cyst of pancreas: Secondary | ICD-10-CM | POA: Diagnosis not present

## 2020-10-15 DIAGNOSIS — Z87828 Personal history of other (healed) physical injury and trauma: Secondary | ICD-10-CM | POA: Diagnosis not present

## 2020-10-20 DIAGNOSIS — S32010A Wedge compression fracture of first lumbar vertebra, initial encounter for closed fracture: Secondary | ICD-10-CM | POA: Diagnosis not present

## 2020-10-20 DIAGNOSIS — S22080A Wedge compression fracture of T11-T12 vertebra, initial encounter for closed fracture: Secondary | ICD-10-CM | POA: Diagnosis not present

## 2020-10-27 DIAGNOSIS — E785 Hyperlipidemia, unspecified: Secondary | ICD-10-CM | POA: Diagnosis not present

## 2020-10-27 DIAGNOSIS — K219 Gastro-esophageal reflux disease without esophagitis: Secondary | ICD-10-CM | POA: Diagnosis not present

## 2020-11-05 DIAGNOSIS — M810 Age-related osteoporosis without current pathological fracture: Secondary | ICD-10-CM | POA: Diagnosis not present

## 2020-11-05 DIAGNOSIS — E039 Hypothyroidism, unspecified: Secondary | ICD-10-CM | POA: Diagnosis not present

## 2020-11-25 ENCOUNTER — Ambulatory Visit (INDEPENDENT_AMBULATORY_CARE_PROVIDER_SITE_OTHER): Payer: Medicare Other

## 2020-11-25 DIAGNOSIS — I452 Bifascicular block: Secondary | ICD-10-CM

## 2020-11-25 LAB — CUP PACEART REMOTE DEVICE CHECK
Battery Remaining Longevity: 80 mo
Battery Remaining Percentage: 68 %
Battery Voltage: 2.99 V
Brady Statistic AP VP Percent: 28 %
Brady Statistic AP VS Percent: 12 %
Brady Statistic AS VP Percent: 2.3 %
Brady Statistic AS VS Percent: 58 %
Brady Statistic RA Percent Paced: 39 %
Brady Statistic RV Percent Paced: 30 %
Date Time Interrogation Session: 20220830033212
Implantable Lead Implant Date: 20190205
Implantable Lead Implant Date: 20190205
Implantable Lead Location: 753859
Implantable Lead Location: 753860
Implantable Pulse Generator Implant Date: 20190205
Lead Channel Impedance Value: 350 Ohm
Lead Channel Impedance Value: 540 Ohm
Lead Channel Pacing Threshold Amplitude: 0.5 V
Lead Channel Pacing Threshold Amplitude: 0.5 V
Lead Channel Pacing Threshold Pulse Width: 0.5 ms
Lead Channel Pacing Threshold Pulse Width: 0.5 ms
Lead Channel Sensing Intrinsic Amplitude: 2 mV
Lead Channel Sensing Intrinsic Amplitude: 9.5 mV
Lead Channel Setting Pacing Amplitude: 0.75 V
Lead Channel Setting Pacing Amplitude: 2 V
Lead Channel Setting Pacing Pulse Width: 0.5 ms
Lead Channel Setting Sensing Sensitivity: 2 mV
Pulse Gen Model: 2272
Pulse Gen Serial Number: 8993229

## 2020-12-02 DIAGNOSIS — S32010A Wedge compression fracture of first lumbar vertebra, initial encounter for closed fracture: Secondary | ICD-10-CM | POA: Diagnosis not present

## 2020-12-02 DIAGNOSIS — S22080A Wedge compression fracture of T11-T12 vertebra, initial encounter for closed fracture: Secondary | ICD-10-CM | POA: Diagnosis not present

## 2020-12-02 DIAGNOSIS — M2170 Unequal limb length (acquired), unspecified site: Secondary | ICD-10-CM | POA: Diagnosis not present

## 2020-12-08 NOTE — Progress Notes (Signed)
Remote pacemaker transmission.   

## 2021-01-01 DIAGNOSIS — M1712 Unilateral primary osteoarthritis, left knee: Secondary | ICD-10-CM | POA: Diagnosis not present

## 2021-01-05 DIAGNOSIS — H04123 Dry eye syndrome of bilateral lacrimal glands: Secondary | ICD-10-CM | POA: Diagnosis not present

## 2021-01-12 ENCOUNTER — Telehealth: Payer: Self-pay | Admitting: Oncology

## 2021-01-12 NOTE — Telephone Encounter (Signed)
Patient called to make sure she wasn't to be scheduled for CT Scan.  The last office states CT to be done in 6 months

## 2021-01-13 ENCOUNTER — Other Ambulatory Visit: Payer: Self-pay | Admitting: Hematology and Oncology

## 2021-01-13 DIAGNOSIS — C8213 Follicular lymphoma grade II, intra-abdominal lymph nodes: Secondary | ICD-10-CM

## 2021-01-13 NOTE — Assessment & Plan Note (Addendum)
CT chest, abdomen and pelvis in July revealed with stable lymphadenopathy, but a slight increase in the pulmonary nodules. We decided against further evaluation with PET. She remains fairly stable, except for weight loss, but she has changed the way she eats and has had recent dental issues. I gave her samples of Glucerna to try in between meals and encouraged her to use at least one a day to avoid continued weight loss. We will plan to see her back in 3 months with a CBC, comprehensive metabolic panel, LDH and CT chest, abdomen and pelvis for repeat clinical assessment.
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
## 2021-01-14 ENCOUNTER — Inpatient Hospital Stay: Payer: Medicare Other

## 2021-01-14 ENCOUNTER — Other Ambulatory Visit: Payer: Self-pay | Admitting: Hematology and Oncology

## 2021-01-14 ENCOUNTER — Inpatient Hospital Stay: Payer: Medicare Other | Attending: Hematology and Oncology | Admitting: Hematology and Oncology

## 2021-01-14 ENCOUNTER — Encounter: Payer: Self-pay | Admitting: Hematology and Oncology

## 2021-01-14 DIAGNOSIS — C8213 Follicular lymphoma grade II, intra-abdominal lymph nodes: Secondary | ICD-10-CM | POA: Diagnosis not present

## 2021-01-14 LAB — BASIC METABOLIC PANEL
BUN: 19 (ref 4–21)
CO2: 25 — AB (ref 13–22)
Chloride: 107 (ref 99–108)
Creatinine: 1 (ref 0.5–1.1)
Glucose: 173
Potassium: 3.9 (ref 3.4–5.3)
Sodium: 142 (ref 137–147)

## 2021-01-14 LAB — COMPREHENSIVE METABOLIC PANEL
Albumin: 4.3 (ref 3.5–5.0)
Calcium: 8.7 (ref 8.7–10.7)

## 2021-01-14 LAB — CBC AND DIFFERENTIAL
HCT: 43 (ref 36–46)
Hemoglobin: 14.3 (ref 12.0–16.0)
Neutrophils Absolute: 8.74
Platelets: 171 (ref 150–399)
WBC: 10.4

## 2021-01-14 LAB — HEPATIC FUNCTION PANEL
ALT: 14 (ref 7–35)
AST: 26 (ref 13–35)
Alkaline Phosphatase: 69 (ref 25–125)
Bilirubin, Total: 0.7

## 2021-01-14 LAB — LACTATE DEHYDROGENASE: LDH: 175 U/L (ref 98–192)

## 2021-01-14 LAB — CBC: RBC: 4.99 (ref 3.87–5.11)

## 2021-01-15 ENCOUNTER — Telehealth: Payer: Self-pay | Admitting: Hematology and Oncology

## 2021-01-15 DIAGNOSIS — L03211 Cellulitis of face: Secondary | ICD-10-CM | POA: Diagnosis not present

## 2021-01-15 DIAGNOSIS — E039 Hypothyroidism, unspecified: Secondary | ICD-10-CM | POA: Diagnosis not present

## 2021-01-15 NOTE — Telephone Encounter (Signed)
Received phone call this am from patient stating she had a horrible night and could not sleep. Her teeth are still painful and now her face is swelling. She has seen the dentist already, so I recommended she see Dr. Delena Bali, as she did not have evidence of progressive lymphoma at her appt yesterday. She verbalized understanding.

## 2021-01-20 DIAGNOSIS — M1712 Unilateral primary osteoarthritis, left knee: Secondary | ICD-10-CM | POA: Diagnosis not present

## 2021-01-21 DIAGNOSIS — M1712 Unilateral primary osteoarthritis, left knee: Secondary | ICD-10-CM | POA: Diagnosis not present

## 2021-01-26 DIAGNOSIS — E785 Hyperlipidemia, unspecified: Secondary | ICD-10-CM | POA: Diagnosis not present

## 2021-01-26 DIAGNOSIS — E039 Hypothyroidism, unspecified: Secondary | ICD-10-CM | POA: Diagnosis not present

## 2021-02-03 DIAGNOSIS — Z8781 Personal history of (healed) traumatic fracture: Secondary | ICD-10-CM | POA: Diagnosis not present

## 2021-02-03 DIAGNOSIS — Z20822 Contact with and (suspected) exposure to covid-19: Secondary | ICD-10-CM | POA: Diagnosis not present

## 2021-02-03 DIAGNOSIS — R112 Nausea with vomiting, unspecified: Secondary | ICD-10-CM | POA: Diagnosis not present

## 2021-02-03 DIAGNOSIS — E039 Hypothyroidism, unspecified: Secondary | ICD-10-CM | POA: Diagnosis not present

## 2021-02-03 DIAGNOSIS — M1990 Unspecified osteoarthritis, unspecified site: Secondary | ICD-10-CM | POA: Diagnosis not present

## 2021-02-03 DIAGNOSIS — R404 Transient alteration of awareness: Secondary | ICD-10-CM | POA: Diagnosis not present

## 2021-02-03 DIAGNOSIS — Z743 Need for continuous supervision: Secondary | ICD-10-CM | POA: Diagnosis not present

## 2021-02-03 DIAGNOSIS — I110 Hypertensive heart disease with heart failure: Secondary | ICD-10-CM | POA: Diagnosis not present

## 2021-02-03 DIAGNOSIS — E785 Hyperlipidemia, unspecified: Secondary | ICD-10-CM | POA: Diagnosis not present

## 2021-02-03 DIAGNOSIS — I482 Chronic atrial fibrillation, unspecified: Secondary | ICD-10-CM | POA: Diagnosis not present

## 2021-02-03 DIAGNOSIS — R110 Nausea: Secondary | ICD-10-CM | POA: Diagnosis not present

## 2021-02-03 DIAGNOSIS — R6889 Other general symptoms and signs: Secondary | ICD-10-CM | POA: Diagnosis not present

## 2021-02-03 DIAGNOSIS — Z79899 Other long term (current) drug therapy: Secondary | ICD-10-CM | POA: Diagnosis not present

## 2021-02-03 DIAGNOSIS — C8590 Non-Hodgkin lymphoma, unspecified, unspecified site: Secondary | ICD-10-CM | POA: Diagnosis not present

## 2021-02-03 DIAGNOSIS — R42 Dizziness and giddiness: Secondary | ICD-10-CM | POA: Diagnosis not present

## 2021-02-03 DIAGNOSIS — Z8673 Personal history of transient ischemic attack (TIA), and cerebral infarction without residual deficits: Secondary | ICD-10-CM | POA: Diagnosis not present

## 2021-02-03 DIAGNOSIS — Z950 Presence of cardiac pacemaker: Secondary | ICD-10-CM | POA: Diagnosis not present

## 2021-02-04 DIAGNOSIS — I482 Chronic atrial fibrillation, unspecified: Secondary | ICD-10-CM | POA: Diagnosis not present

## 2021-02-04 DIAGNOSIS — Z8673 Personal history of transient ischemic attack (TIA), and cerebral infarction without residual deficits: Secondary | ICD-10-CM | POA: Diagnosis not present

## 2021-02-04 DIAGNOSIS — R42 Dizziness and giddiness: Secondary | ICD-10-CM | POA: Diagnosis not present

## 2021-02-11 DIAGNOSIS — R42 Dizziness and giddiness: Secondary | ICD-10-CM | POA: Diagnosis not present

## 2021-02-11 DIAGNOSIS — H8110 Benign paroxysmal vertigo, unspecified ear: Secondary | ICD-10-CM | POA: Diagnosis not present

## 2021-02-11 DIAGNOSIS — R2681 Unsteadiness on feet: Secondary | ICD-10-CM | POA: Diagnosis not present

## 2021-02-17 DIAGNOSIS — H8113 Benign paroxysmal vertigo, bilateral: Secondary | ICD-10-CM | POA: Diagnosis not present

## 2021-02-18 DIAGNOSIS — R2681 Unsteadiness on feet: Secondary | ICD-10-CM | POA: Diagnosis not present

## 2021-02-18 DIAGNOSIS — R42 Dizziness and giddiness: Secondary | ICD-10-CM | POA: Diagnosis not present

## 2021-02-18 DIAGNOSIS — H8110 Benign paroxysmal vertigo, unspecified ear: Secondary | ICD-10-CM | POA: Diagnosis not present

## 2021-02-24 ENCOUNTER — Ambulatory Visit (INDEPENDENT_AMBULATORY_CARE_PROVIDER_SITE_OTHER): Payer: Medicare Other

## 2021-02-24 DIAGNOSIS — I452 Bifascicular block: Secondary | ICD-10-CM | POA: Diagnosis not present

## 2021-02-24 LAB — CUP PACEART REMOTE DEVICE CHECK
Battery Remaining Longevity: 76 mo
Battery Remaining Percentage: 66 %
Battery Voltage: 2.99 V
Brady Statistic AP VP Percent: 28 %
Brady Statistic AP VS Percent: 12 %
Brady Statistic AS VP Percent: 2.5 %
Brady Statistic AS VS Percent: 57 %
Brady Statistic RA Percent Paced: 39 %
Brady Statistic RV Percent Paced: 31 %
Date Time Interrogation Session: 20221129020014
Implantable Lead Implant Date: 20190205
Implantable Lead Implant Date: 20190205
Implantable Lead Location: 753859
Implantable Lead Location: 753860
Implantable Pulse Generator Implant Date: 20190205
Lead Channel Impedance Value: 340 Ohm
Lead Channel Impedance Value: 510 Ohm
Lead Channel Pacing Threshold Amplitude: 0.5 V
Lead Channel Pacing Threshold Amplitude: 0.625 V
Lead Channel Pacing Threshold Pulse Width: 0.5 ms
Lead Channel Pacing Threshold Pulse Width: 0.5 ms
Lead Channel Sensing Intrinsic Amplitude: 2.3 mV
Lead Channel Sensing Intrinsic Amplitude: 5.4 mV
Lead Channel Setting Pacing Amplitude: 0.875
Lead Channel Setting Pacing Amplitude: 2 V
Lead Channel Setting Pacing Pulse Width: 0.5 ms
Lead Channel Setting Sensing Sensitivity: 2 mV
Pulse Gen Model: 2272
Pulse Gen Serial Number: 8993229

## 2021-02-25 DIAGNOSIS — R42 Dizziness and giddiness: Secondary | ICD-10-CM | POA: Diagnosis not present

## 2021-02-25 DIAGNOSIS — H8110 Benign paroxysmal vertigo, unspecified ear: Secondary | ICD-10-CM | POA: Diagnosis not present

## 2021-02-25 DIAGNOSIS — R2681 Unsteadiness on feet: Secondary | ICD-10-CM | POA: Diagnosis not present

## 2021-02-25 DIAGNOSIS — E785 Hyperlipidemia, unspecified: Secondary | ICD-10-CM | POA: Diagnosis not present

## 2021-02-25 DIAGNOSIS — E039 Hypothyroidism, unspecified: Secondary | ICD-10-CM | POA: Diagnosis not present

## 2021-03-05 NOTE — Progress Notes (Signed)
Remote pacemaker transmission.   

## 2021-03-12 DIAGNOSIS — H61303 Acquired stenosis of external ear canal, unspecified, bilateral: Secondary | ICD-10-CM | POA: Diagnosis not present

## 2021-03-12 DIAGNOSIS — H9190 Unspecified hearing loss, unspecified ear: Secondary | ICD-10-CM | POA: Diagnosis not present

## 2021-03-12 DIAGNOSIS — Z8673 Personal history of transient ischemic attack (TIA), and cerebral infarction without residual deficits: Secondary | ICD-10-CM | POA: Diagnosis not present

## 2021-03-12 DIAGNOSIS — H9319 Tinnitus, unspecified ear: Secondary | ICD-10-CM | POA: Diagnosis not present

## 2021-03-12 DIAGNOSIS — E039 Hypothyroidism, unspecified: Secondary | ICD-10-CM | POA: Diagnosis not present

## 2021-03-12 DIAGNOSIS — I499 Cardiac arrhythmia, unspecified: Secondary | ICD-10-CM | POA: Diagnosis not present

## 2021-03-12 DIAGNOSIS — H8110 Benign paroxysmal vertigo, unspecified ear: Secondary | ICD-10-CM | POA: Diagnosis not present

## 2021-03-12 DIAGNOSIS — H903 Sensorineural hearing loss, bilateral: Secondary | ICD-10-CM | POA: Diagnosis not present

## 2021-03-12 DIAGNOSIS — R269 Unspecified abnormalities of gait and mobility: Secondary | ICD-10-CM | POA: Diagnosis not present

## 2021-03-12 DIAGNOSIS — R42 Dizziness and giddiness: Secondary | ICD-10-CM | POA: Diagnosis not present

## 2021-03-12 DIAGNOSIS — Z974 Presence of external hearing-aid: Secondary | ICD-10-CM | POA: Diagnosis not present

## 2021-04-10 NOTE — Progress Notes (Signed)
[facility]  [address] [place],  [place]  [address] [phone]  Clinic Day:  04/16/2021  Referring physician: Nicoletta Dress, MD  This document serves as a record of services personally performed by Hosie Poisson, MD. It was created on their behalf by [facility] E, a trained medical scribe. The creation of this record is based on the scribe's personal observations and the provider's statements to them.  ASSESSMENT & PLAN:   Assessment & Plan: Follicular lymphoma grade II of intra-abdominal lymph nodes ([place]) CT chest, abdomen and pelvis in July 2022 revealed with stable lymphadenopathy, but a slight increase in the pulmonary nodules. We decided against further evaluation with PET. CT imaging from January 2023 is stable to slightly improved.  As recent CT imaging remains stable, we will plan to repeat imaging again in 6 months. She may have mild gastrointestinal blood loss but we have agreed not to pursue this since she is not even anemic. We will plan to see her back in 3 months with a CBC, comprehensive metabolic panel and LDH for repeat clinical assessment. The patient and her son understand the plans discussed today and are in agreement with them.  They knows to contact our office if she develops concerns prior to her next appointment.  I provided 20 minutes of face-to-face time during this this encounter and > 50% was spent counseling as documented under my assessment and plan.    [facility] [address] [place] [address] Dept: [phone] Dept Fax: [phone]   No orders of the defined types were placed in this encounter.     CHIEF COMPLAINT:  CC: Grade II follicular  Current Treatment:  Observation   HISTORY OF PRESENT ILLNESS:   Oncology History  Follicular lymphoma grade II of intra-abdominal lymph nodes ([place])  11/13/2019 Initial  Diagnosis   Follicular lymphoma grade II of intra-abdominal lymph nodes ([place])   12/14/2019 Cancer Staging   Staging form: Hodgkin and Non-Hodgkin Lymphoma, AJCC 8th Edition - Clinical stage from 09/02/3014: Stage I (Follicular lymphoma) - Signed by Derwood Kaplan, MD on 01/31/2020       INTERVAL HISTORY:  Bleu is here for routine follow up and states that she has been fairly well. She stopped taking her cholesterol medication about 2 months ago, and has felt better since that time. She does continue to have fatigue. She also notes numbness of the first two fingers of the right hand. This seems consistent with carpal tunnel and I suggested she try a wrist splint at night. She did have an episode of "burgundy stools". In view of her age and as this has resolved, we will not pursue aggressive evaluation. CT abdomen and pelvis from January 17th reveals solid nodules of the bilateral lower lungs are unchanged in size compared with prior exam. Interval decreased size of left periaortic lymph node measuring 2.2 cm, down from 3.1 cm. No evidence of new or progressive disease in the abdomen or pelvis. Partially imaged part solid nodule of the left lower lobe is similar in size when compared with prior exams dating back to May 01, 2020. Blood counts and chemistries are unremarkable. However, her hemoglobin did drop from 14.3 to 13.0, so she may have had some mild blood loss. Her  appetite is good, and she has gained nearly 4 pounds since her last visit.  She denies fever, chills or other signs of infection.  She denies nausea, vomiting, bowel issues, or abdominal pain.  She denies sore throat, cough, dyspnea, or chest pain.  REVIEW OF SYSTEMS:  Review of Systems  Constitutional:  Positive for fatigue. Negative for appetite change, chills, fever and unexpected weight change.  HENT:  Negative.    Eyes: Negative.   Respiratory: Negative.  Negative for chest tightness, cough, hemoptysis, shortness of  breath and wheezing.   Cardiovascular: Negative.  Negative for chest pain, leg swelling and palpitations.  Gastrointestinal: Negative.  Negative for abdominal distention, abdominal pain, blood in stool, constipation, diarrhea, nausea and vomiting.  Endocrine: Negative.   Genitourinary: Negative.  Negative for difficulty urinating, dysuria, frequency and hematuria.   Musculoskeletal: Negative.  Negative for arthralgias, back pain, flank pain, gait problem and myalgias.  Skin: Negative.   Neurological:  Positive for numbness (first two fingers of the right hand). Negative for dizziness, extremity weakness, gait problem, headaches, light-headedness, seizures and speech difficulty.  Hematological: Negative.   Psychiatric/Behavioral: Negative.  Negative for depression and sleep disturbance. The patient is not nervous/anxious.     VITALS:  Blood pressure (!) 141/72, pulse 70, temperature 98 F (36.7 C), temperature source Oral, resp. rate 18, height 5' (1.524 m), weight 114 lb 12.8 oz (52.1 kg), SpO2 94 %.  Wt Readings from Last 3 Encounters:  04/16/21 114 lb 12.8 oz (52.1 kg)  01/14/21 111 lb (50.3 kg)  10/14/20 117 lb 9.6 oz (53.3 kg)    Body mass index is 22.42 kg/m.  Performance status (ECOG): 1 - Symptomatic but completely ambulatory  PHYSICAL EXAM:  Physical Exam Constitutional:      General: She is not in acute distress.    Appearance: Normal appearance. She is normal weight.  HENT:     Head: Normocephalic and atraumatic.  Eyes:     General: No scleral icterus.    Extraocular Movements: Extraocular movements intact.     Conjunctiva/sclera: Conjunctivae normal.     Pupils: Pupils are equal, round, and reactive to light.  Cardiovascular:     Rate and Rhythm: Normal rate and regular rhythm.     Pulses: Normal pulses.     Heart sounds: Normal heart sounds. No murmur heard.   No friction rub. No gallop.  Pulmonary:     Effort: Pulmonary effort is normal. No respiratory distress.      Breath sounds: Normal breath sounds.  Abdominal:     General: Bowel sounds are normal. There is no distension.     Palpations: Abdomen is soft. There is no hepatomegaly, splenomegaly or mass.     Tenderness: There is no abdominal tenderness.  Musculoskeletal:        General: Normal range of motion.     Cervical back: Normal range of motion and neck supple.     Right lower leg: No edema.     Left lower leg: No edema.     Comments: Brace of the right leg  Lymphadenopathy:     Cervical: No cervical adenopathy.  Skin:    General: Skin is warm and dry.  Neurological:     General: No focal deficit present.     Mental Status: She is alert and oriented to person, place, and time. Mental status is at baseline.  Psychiatric:        Mood and Affect: Mood normal.        Behavior: Behavior normal.        Thought Content: Thought content normal.        Judgment: Judgment  normal.    LABS:   CBC Latest Ref Rng & Units 04/14/2021 01/14/2021 10/14/2020  WBC - 6.4 10.4 7.0  Hemoglobin 12.0 - 16.0 13.0 14.3 12.8  Hematocrit 36 - 46 40 43 39  Platelets 150 - 399 207 171 192   CMP Latest Ref Rng & Units 04/14/2021 01/14/2021 10/14/2020  Glucose 65 - 99 mg/dL - - -  BUN 4 - 21 17 19 21   Creatinine 0.5 - 1.1 0.8 1.0 0.8  Sodium 137 - 147 138 142 141  Potassium 3.4 - 5.3 4.0 3.9 4.0  Chloride 99 - 108 103 107 104  CO2 13 - 22 29(A) 25(A) 29(A)  Calcium 8.7 - 10.7 8.8 8.7 9.5  Alkaline Phos 25 - 125 49 69 86  AST 13 - 35 24 26 29   ALT 7 - 35 17 14 18      Lab Results  Component Value Date   CEA1 5.3 (H) 10/01/2020   /  CEA  Date Value Ref Range Status  10/01/2020 5.3 (H) 0.0 - 4.7 ng/mL Final    Comment:    (NOTE)                             Nonsmokers          <3.9                             Smokers             <5.6 [employer] Electrochemiluminescence Immunoassay (ECLIA) Values obtained with different assay methods or kits cannot be used interchangeably.  Results cannot  be interpreted as absolute evidence of the presence or absence of malignant disease. Performed At: [facility] [place], [place] [id number] Rush Farmer MD GE:[id number]     Lab Results  Component Value Date   LDH 175 01/14/2021   LDH 549 10/01/2020   LDH 180 01/31/2020    STUDIES:  No results found.    EXAM: 04/14/2021 CT ABDOMEN AND PELVIS WITH CONTRAST   TECHNIQUE:  Multidetector CT imaging of the abdomen and pelvis was performed  using the standard protocol following bolus administration of  intravenous contrast.   RADIATION DOSE REDUCTION: This exam was performed according to the  departmental dose-optimization program which includes automated  exposure control, adjustment of the mA and/or kV according to  patient size and/or use of iterative reconstruction technique.   CONTRAST: 100CC ISOVUE 370   COMPARISON: Multiple priors, most recent CT abdomen and pelvis  dated October 15, 2020   FINDINGS:  Lower chest: Bibasilar atelectasis. Part solid nodule of the left  lower lobe which is partially imaged and measures approximately 1.6  x 1.1 cm on series 1, image 58. bilateral solid pulmonary nodules  are unchanged in size. Reference solid nodule of the left lower lobe  measuring 1.5 x 0.9 cm on series 2, image 10. Reference Juxtapleural  right lower lobe solid pulmonary nodule measuring 2.6 x 1.7 cm on  series 2 image 11.  Hepatobiliary: Stable low-attenuation liver lesions which are likely  simple cysts and hemangiomas. No suspicious liver lesions.  Gallbladder is unremarkable. No biliary ductal dilation.  Pancreas: Cystic lesion of the pancreatic body measures  approximately 7 mm on series 2, image 21, previously measured up to  10 mm. Otherwise unremarkable.  Spleen: Normal in size without focal abnormality.  Adrenals/Urinary Tract: Adrenal glands  are unremarkable. Kidneys are normal, without renal calculi, focal lesion, or  hydronephrosis.  Bladder is unremarkable.  Stomach/Bowel: Stomach is within normal limits. Severe sigmoid  diverticulosis. No evidence of bowel wall thickening, distention, or  inflammatory changes.  Vascular/Lymphatic: Enlarged left periaortic lymph node measuring  2.2 cm in short axis on series 2, image 26, previously measured up  to 3 1 cm. Atherosclerotic disease of the abdominal aorta.  Reproductive: Status post hysterectomy. No adnexal masses.  Other: No abdominal wall hernia or abnormality. No abdominopelvic  ascites.  Musculoskeletal: Old bilateral inferior pubic rami fractures. Prior  total left hip replacement. Numerous compression deformities and  prior vertebral augmentation, unchanged when compared with prior  exam.   IMPRESSION:  1. Solid nodules of the bilateral lower lungs are unchanged in size  compared with prior exam.  2. Interval decreased size of left periaortic lymph node. No  evidence of new or progressive disease in the abdomen or pelvis.  3. Partially imaged part solid nodule of the left lower lobe is  similar in size when compared with prior exams dating back to  May 01, 2020 and concerning for indolent primary lung  malignancy. Recommend attention on follow-up.  4. Aortic Atherosclerosis (ICD10-I70.0).   HISTORY:   Allergies:  Allergies  Allergen Reactions   Clarithromycin Rash   Penicillins Rash    Has patient had a PCN reaction causing immediate rash, facial/tongue/throat swelling, SOB or lightheadedness with hypotension: Unknown Has patient had a PCN reaction causing severe rash involving mucus membranes or skin necrosis: Unknown Has patient had a PCN reaction that required hospitalization: No Has patient had a PCN reaction occurring within the last 10 years: No If all of the above answers are "NO", then may proceed with Cephalosporin use.    Sulfa Antibiotics Rash    Current Medications: Current Outpatient Medications  Medication Sig  Dispense Refill   Calcium Carbonate-Vitamin D 600-400 MG-UNIT tablet Take 1 tablet by mouth 2 (two) times daily.      levothyroxine (SYNTHROID) 88 MCG tablet Take 88 mcg by mouth every other day. Rotates it with 66mcg every other day     levothyroxine (SYNTHROID, LEVOTHROID) 75 MCG tablet Take 75 mcg by mouth every other day. Rotates every other day with 15mcg     Multiple Vitamin (MULTIVITAMIN) capsule Take 1 capsule by mouth daily.     omeprazole (PRILOSEC) 20 MG capsule Take 20 mg by mouth 2 (two) times daily.     PROLIA 60 MG/ML SOSY injection Inject 60 mg into the skin every 6 (six) months.     sertraline (ZOLOFT) 100 MG tablet Take 100 mg by mouth daily.     traMADol (ULTRAM) 50 MG tablet Take 50 mg by mouth daily.   0   No current facility-administered medications for this visit.     I, Rita Ohara, am acting as scribe for Derwood Kaplan, MD  I have reviewed this report as typed by the medical scribe, and it is complete and accurate.

## 2021-04-14 DIAGNOSIS — C8213 Follicular lymphoma grade II, intra-abdominal lymph nodes: Secondary | ICD-10-CM | POA: Diagnosis not present

## 2021-04-14 DIAGNOSIS — R918 Other nonspecific abnormal finding of lung field: Secondary | ICD-10-CM | POA: Diagnosis not present

## 2021-04-14 DIAGNOSIS — I700 Atherosclerosis of aorta: Secondary | ICD-10-CM | POA: Diagnosis not present

## 2021-04-14 LAB — CBC AND DIFFERENTIAL
HCT: 40 (ref 36–46)
Hemoglobin: 13 (ref 12.0–16.0)
Neutrophils Absolute: 4.54
Platelets: 207 (ref 150–399)
WBC: 6.4

## 2021-04-14 LAB — HEPATIC FUNCTION PANEL
ALT: 17 (ref 7–35)
AST: 24 (ref 13–35)
Alkaline Phosphatase: 49 (ref 25–125)
Bilirubin, Total: 0.5

## 2021-04-14 LAB — COMPREHENSIVE METABOLIC PANEL
Albumin: 4.3 (ref 3.5–5.0)
Calcium: 8.8 (ref 8.7–10.7)

## 2021-04-14 LAB — BASIC METABOLIC PANEL
BUN: 17 (ref 4–21)
CO2: 29 — AB (ref 13–22)
Chloride: 103 (ref 99–108)
Creatinine: 0.8 (ref 0.5–1.1)
Glucose: 91
Potassium: 4 (ref 3.4–5.3)
Sodium: 138 (ref 137–147)

## 2021-04-14 LAB — CBC: RBC: 4.59 (ref 3.87–5.11)

## 2021-04-15 ENCOUNTER — Encounter: Payer: Self-pay | Admitting: Oncology

## 2021-04-16 ENCOUNTER — Telehealth: Payer: Self-pay | Admitting: Oncology

## 2021-04-16 ENCOUNTER — Inpatient Hospital Stay: Payer: Medicare Other | Attending: Oncology | Admitting: Oncology

## 2021-04-16 ENCOUNTER — Other Ambulatory Visit: Payer: Self-pay

## 2021-04-16 ENCOUNTER — Other Ambulatory Visit: Payer: Self-pay | Admitting: Oncology

## 2021-04-16 ENCOUNTER — Encounter: Payer: Self-pay | Admitting: Oncology

## 2021-04-16 VITALS — BP 141/72 | HR 70 | Temp 98.0°F | Resp 18 | Ht 60.0 in | Wt 114.8 lb

## 2021-04-16 DIAGNOSIS — C8213 Follicular lymphoma grade II, intra-abdominal lymph nodes: Secondary | ICD-10-CM

## 2021-04-16 NOTE — Telephone Encounter (Signed)
Per 1/19 los next appt scheduled and confirmed with patient

## 2021-05-26 ENCOUNTER — Ambulatory Visit (INDEPENDENT_AMBULATORY_CARE_PROVIDER_SITE_OTHER): Payer: Medicare Other

## 2021-05-26 DIAGNOSIS — I452 Bifascicular block: Secondary | ICD-10-CM | POA: Diagnosis not present

## 2021-05-26 LAB — CUP PACEART REMOTE DEVICE CHECK
Battery Remaining Longevity: 73 mo
Battery Remaining Percentage: 63 %
Battery Voltage: 2.99 V
Brady Statistic AP VP Percent: 30 %
Brady Statistic AP VS Percent: 11 %
Brady Statistic AS VP Percent: 2.8 %
Brady Statistic AS VS Percent: 56 %
Brady Statistic RA Percent Paced: 40 %
Brady Statistic RV Percent Paced: 33 %
Date Time Interrogation Session: 20230228055934
Implantable Lead Implant Date: 20190205
Implantable Lead Implant Date: 20190205
Implantable Lead Location: 753859
Implantable Lead Location: 753860
Implantable Pulse Generator Implant Date: 20190205
Lead Channel Impedance Value: 340 Ohm
Lead Channel Impedance Value: 530 Ohm
Lead Channel Pacing Threshold Amplitude: 0.5 V
Lead Channel Pacing Threshold Amplitude: 0.625 V
Lead Channel Pacing Threshold Pulse Width: 0.5 ms
Lead Channel Pacing Threshold Pulse Width: 0.5 ms
Lead Channel Sensing Intrinsic Amplitude: 2.3 mV
Lead Channel Sensing Intrinsic Amplitude: 8.5 mV
Lead Channel Setting Pacing Amplitude: 0.875
Lead Channel Setting Pacing Amplitude: 2 V
Lead Channel Setting Pacing Pulse Width: 0.5 ms
Lead Channel Setting Sensing Sensitivity: 2 mV
Pulse Gen Model: 2272
Pulse Gen Serial Number: 8993229

## 2021-06-02 NOTE — Progress Notes (Signed)
Remote pacemaker transmission.   

## 2021-06-03 DIAGNOSIS — M65321 Trigger finger, right index finger: Secondary | ICD-10-CM | POA: Diagnosis not present

## 2021-06-03 DIAGNOSIS — G5601 Carpal tunnel syndrome, right upper limb: Secondary | ICD-10-CM | POA: Diagnosis not present

## 2021-06-22 DIAGNOSIS — D509 Iron deficiency anemia, unspecified: Secondary | ICD-10-CM | POA: Diagnosis not present

## 2021-06-22 DIAGNOSIS — E039 Hypothyroidism, unspecified: Secondary | ICD-10-CM | POA: Diagnosis not present

## 2021-06-22 DIAGNOSIS — K222 Esophageal obstruction: Secondary | ICD-10-CM | POA: Diagnosis not present

## 2021-06-22 DIAGNOSIS — Z789 Other specified health status: Secondary | ICD-10-CM | POA: Diagnosis not present

## 2021-06-22 DIAGNOSIS — K219 Gastro-esophageal reflux disease without esophagitis: Secondary | ICD-10-CM | POA: Diagnosis not present

## 2021-06-22 DIAGNOSIS — M5137 Other intervertebral disc degeneration, lumbosacral region: Secondary | ICD-10-CM | POA: Diagnosis not present

## 2021-06-22 DIAGNOSIS — M810 Age-related osteoporosis without current pathological fracture: Secondary | ICD-10-CM | POA: Diagnosis not present

## 2021-06-22 DIAGNOSIS — E785 Hyperlipidemia, unspecified: Secondary | ICD-10-CM | POA: Diagnosis not present

## 2021-06-23 DIAGNOSIS — M1712 Unilateral primary osteoarthritis, left knee: Secondary | ICD-10-CM | POA: Diagnosis not present

## 2021-06-24 DIAGNOSIS — E785 Hyperlipidemia, unspecified: Secondary | ICD-10-CM | POA: Diagnosis not present

## 2021-06-24 DIAGNOSIS — Z9181 History of falling: Secondary | ICD-10-CM | POA: Diagnosis not present

## 2021-06-24 DIAGNOSIS — Z139 Encounter for screening, unspecified: Secondary | ICD-10-CM | POA: Diagnosis not present

## 2021-06-24 DIAGNOSIS — Z0000 Encounter for general adult medical examination without abnormal findings: Secondary | ICD-10-CM | POA: Diagnosis not present

## 2021-06-25 ENCOUNTER — Encounter: Payer: Self-pay | Admitting: Oncology

## 2021-07-01 DIAGNOSIS — G8929 Other chronic pain: Secondary | ICD-10-CM | POA: Diagnosis not present

## 2021-07-01 DIAGNOSIS — G5601 Carpal tunnel syndrome, right upper limb: Secondary | ICD-10-CM | POA: Diagnosis not present

## 2021-07-01 DIAGNOSIS — M25511 Pain in right shoulder: Secondary | ICD-10-CM | POA: Diagnosis not present

## 2021-07-01 DIAGNOSIS — M65321 Trigger finger, right index finger: Secondary | ICD-10-CM | POA: Diagnosis not present

## 2021-07-06 ENCOUNTER — Ambulatory Visit (INDEPENDENT_AMBULATORY_CARE_PROVIDER_SITE_OTHER): Payer: Medicare Other | Admitting: Cardiology

## 2021-07-06 ENCOUNTER — Encounter: Payer: Self-pay | Admitting: Cardiology

## 2021-07-06 VITALS — BP 114/73 | HR 76 | Ht 60.0 in | Wt 118.2 lb

## 2021-07-06 DIAGNOSIS — Z950 Presence of cardiac pacemaker: Secondary | ICD-10-CM | POA: Diagnosis not present

## 2021-07-06 DIAGNOSIS — R55 Syncope and collapse: Secondary | ICD-10-CM | POA: Diagnosis not present

## 2021-07-06 NOTE — Patient Instructions (Signed)
Medication Instructions:  ?Your physician recommends that you continue on your current medications as directed. Please refer to the Current Medication list given to you today. ? ?*If you need a refill on your cardiac medications before your next appointment, please call your pharmacy* ? ? ?Lab Work: ?None ordered. ? ? ?If you have labs (blood work) drawn today and your tests are completely normal, you will receive your results only by: ?MyChart Message (if you have MyChart) OR ?A paper copy in the mail ?If you have any lab test that is abnormal or we need to change your treatment, we will call you to review the results. ? ? ?Testing/Procedures: ?None ordered. ? ? ? ?Follow-Up: ?At [facility], you and your health needs are our priority.  As part of our continuing mission to provide you with exceptional heart care, we have created designated Provider Care Teams.  These Care Teams include your primary Cardiologist (physician) and Advanced Practice Providers (APPs -  Physician Assistants and Nurse Practitioners) who all work together to provide you with the care you need, when you need it. ? ?We recommend signing up for the patient portal called "MyChart".  Sign up information is provided on this After Visit Summary.  MyChart is used to connect with patients for Virtual Visits (Telemedicine).  Patients are able to view lab/test results, encounter notes, upcoming appointments, etc.  Non-urgent messages can be sent to your provider as well.   ?To learn more about what you can do with MyChart, go to [email or web address].   ? ?Your next appointment:   ?12 month(s) ? ?The format for your next appointment:   ?In Person ? ?Provider:   ?Allegra Lai, MD{ ? ?Important Information About Sugar ? ? ? ? ?  ?
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
## 2021-07-16 ENCOUNTER — Other Ambulatory Visit: Payer: Self-pay

## 2021-07-16 ENCOUNTER — Inpatient Hospital Stay: Payer: Medicare Other | Attending: Oncology | Admitting: Hematology and Oncology

## 2021-07-16 ENCOUNTER — Encounter: Payer: Self-pay | Admitting: Hematology and Oncology

## 2021-07-16 ENCOUNTER — Inpatient Hospital Stay: Payer: Medicare Other

## 2021-07-16 ENCOUNTER — Telehealth: Payer: Self-pay | Admitting: Hematology and Oncology

## 2021-07-16 VITALS — BP 143/73 | HR 77 | Temp 98.3°F | Resp 20 | Ht 60.0 in | Wt 116.3 lb

## 2021-07-16 DIAGNOSIS — C8213 Follicular lymphoma grade II, intra-abdominal lymph nodes: Secondary | ICD-10-CM

## 2021-07-16 DIAGNOSIS — Z8572 Personal history of non-Hodgkin lymphomas: Secondary | ICD-10-CM | POA: Insufficient documentation

## 2021-07-16 LAB — BASIC METABOLIC PANEL
BUN: 28 — AB (ref 4–21)
CO2: 25 — AB (ref 13–22)
Chloride: 107 (ref 99–108)
Creatinine: 0.9 (ref 0.5–1.1)
Glucose: 93
Potassium: 4.4 mEq/L (ref 3.5–5.1)
Sodium: 141 (ref 137–147)

## 2021-07-16 LAB — CBC AND DIFFERENTIAL
HCT: 39 (ref 36–46)
Hemoglobin: 12.7 (ref 12.0–16.0)
Neutrophils Absolute: 4.49
Platelets: 188 10*3/uL (ref 150–400)
WBC: 6.5

## 2021-07-16 LAB — HEPATIC FUNCTION PANEL
ALT: 16 U/L (ref 7–35)
AST: 26 (ref 13–35)
Alkaline Phosphatase: 63 (ref 25–125)
Bilirubin, Total: 0.7

## 2021-07-16 LAB — LACTATE DEHYDROGENASE: LDH: 181 U/L (ref 98–192)

## 2021-07-16 LAB — COMPREHENSIVE METABOLIC PANEL
Albumin: 4 (ref 3.5–5.0)
Calcium: 9 (ref 8.7–10.7)

## 2021-07-16 LAB — CBC: RBC: 4.5 (ref 3.87–5.11)

## 2021-07-16 NOTE — Assessment & Plan Note (Addendum)
A 88 y.o. female with a low grade follicular lymphoma diagnosed in September 2021.CT chest, abdomen and pelvis in July 2022 revealed with stable lymphadenopathy, but a slight increase in the pulmonary nodules. We decided against further evaluation with PET. CT imaging from January 2023 is stable to slightly improved. We will plan for repeat imaging with her next appointment. She reports an overnight stay in the hospital for a syncope episode with dizziness and nausea. This was most likely related to vertigo and she has not had recurrent episodes. She also had an episode where she saw "black lines" in her right eye and states since then she feels like she has a film on her eye. She was evaluated by optometry who found nothing. She is still concerned and will meet with her PCP this week.  She will return to clinic in 3 months.  ?

## 2021-07-16 NOTE — Telephone Encounter (Signed)
07/16/21 SPOKE WITH PATIENT AND SCHEDULED ALL APPTS. ?

## 2021-07-16 NOTE — Progress Notes (Signed)
?Patient Care Team: ?Nicoletta Dress, MD as PCP - General (Internal Medicine) ?Constance Haw, MD as PCP - Electrophysiology (Cardiology) ? ?Clinic Day:  07/16/2021 ? ?Referring physician: Nicoletta Dress, MD ? ?ASSESSMENT & PLAN:  ? ?Assessment & Plan: ?Follicular lymphoma grade II of intra-abdominal lymph nodes (HCC) ?A 87 y.o. female with a low grade follicular lymphoma diagnosed in September 2021.CT chest, abdomen and pelvis in July 2022 revealed with stable lymphadenopathy, but a slight increase in the pulmonary nodules. We decided against further evaluation with PET. CT imaging from January 2023 is stable to slightly improved. We will plan for repeat imaging with her next appointment. She reports an overnight stay in the hospital for a syncope episode with dizziness and nausea. This was most likely related to vertigo and she has not had recurrent episodes. She also had an episode where she saw "black lines" in her right eye and states since then she feels like she has a film on her eye. She was evaluated by optometry who found nothing. She is still concerned and will meet with her PCP this week.  She will return to clinic in 3 months.   ? ?The patient understands the plans discussed today and is in agreement with them.  She knows to contact our office if she develops concerns prior to her next appointment. ? ? ? ?Melodye Ped, NP  ?[address] [facility] ?[place] [address] ?Dept: [phone] ?Dept Fax: [phone]  ? ?Orders Placed This Encounter  ?Procedures  ? CT CHEST ABDOMEN PELVIS W CONTRAST  ?  Standing Status:   Future  ?  Standing Expiration Date:   07/17/2022  ?  Order Specific Question:   Preferred imaging location?  ?  Answer:   External  ?  Order Specific Question:   Is Oral Contrast requested for this exam?  ?  Answer:   Yes, Per Radiology protocol  ? CBC w Diff ([facility] scanned report) STAT  ?  Standing  Status:   Future  ?  Standing Expiration Date:   07/17/2022  ? CMP ([facility] scanned report) STAT  ?  Standing Status:   Future  ?  Standing Expiration Date:   07/17/2022  ? Lactate dehydrogenase  ? CBC and differential  ?  This external order was created through the Results Console.  ? CBC  ?  This external order was created through the Results Console.  ? Basic metabolic panel  ?  This external order was created through the Results Console.  ? Comprehensive metabolic panel  ?  This external order was created through the Results Console.  ? Hepatic function panel  ?  This external order was created through the Results Console.  ?  ? ? ?CHIEF COMPLAINT:  ?CC: A 88 year old with history of follicular lymphoma here for 3 month evaluation ? ?Current Treatment:  Surveillance ? ?INTERVAL HISTORY:  ?Jameelah is here today for repeat clinical assessment. She denies fevers or chills. She denies pain. Her appetite is good. Her weight has been stable. ? ?I have reviewed the past medical history, past surgical history, social history and family history with the patient and they are unchanged from previous note. ? ?ALLERGIES:  is allergic to clarithromycin, penicillins, and sulfa antibiotics. ? ?MEDICATIONS:  ?Current Outpatient Medications  ?Medication Sig Dispense Refill  ? Calcium Carbonate-Vitamin D 600-400 MG-UNIT tablet Take 1 tablet by mouth 2 (two) times daily.     ?  levothyroxine (SYNTHROID) 88 MCG tablet Take 88 mcg by mouth every other day. Rotates it with 13mg every other day    ? levothyroxine (SYNTHROID, LEVOTHROID) 75 MCG tablet Take 75 mcg by mouth every other day. Rotates every other day with 847m    ? Multiple Vitamin (MULTIVITAMIN) capsule Take 1 capsule by mouth daily.    ? omeprazole (PRILOSEC) 20 MG capsule Take 20 mg by mouth as needed (heartburn).    ? PROLIA 60 MG/ML SOSY injection Inject 60 mg into the skin every 6 (six) months.    ? sertraline (ZOLOFT) 100 MG tablet Take 100 mg by mouth daily.    ?  traMADol (ULTRAM) 50 MG tablet Take 50 mg by mouth daily.   0  ? ?No current facility-administered medications for this visit.  ? ? ?HISTORY OF PRESENT ILLNESS:  ? ?Oncology History  ?Follicular lymphoma grade II of intra-abdominal lymph nodes ([facility] ?11/13/2019 Initial Diagnosis  ? Follicular lymphoma grade II of intra-abdominal lymph nodes ([facility]? ?  ?12/14/2019 Cancer Staging  ? Staging form: Hodgkin and Non-Hodgkin Lymphoma, AJCC 8th Edition ?- Clinical stage from 12/01/31/2951Stage I (Follicular lymphoma) - Signed by McDerwood KaplanMD on 01/31/2020 ? ?  ?  ? ? ?REVIEW OF SYSTEMS:  ? ?Constitutional: Denies fevers, chills or abnormal weight loss ?Eyes: Denies blurriness of vision ?Ears, nose, mouth, throat, and face: Denies mucositis or sore throat ?Respiratory: Denies cough, dyspnea or wheezes ?Cardiovascular: Denies palpitation, chest discomfort or lower extremity swelling ?Gastrointestinal:  Denies nausea, heartburn or change in bowel habits ?Skin: Denies abnormal skin rashes ?Lymphatics: Denies new lymphadenopathy or easy bruising ?Neurological:Denies numbness, tingling or new weaknesses ?Behavioral/Psych: Mood is stable, no new changes  ?All other systems were reviewed with the patient and are negative. ? ? ?VITALS:  ?Blood pressure (!) 143/73, pulse 77, temperature 98.3 ?F (36.8 ?C), temperature source Oral, resp. rate 20, height 5' (1.524 m), weight 116 lb 4.8 oz (52.8 kg), SpO2 95 %.  ?Wt Readings from Last 3 Encounters:  ?07/16/21 116 lb 4.8 oz (52.8 kg)  ?07/06/21 118 lb 3.2 oz (53.6 kg)  ?04/16/21 114 lb 12.8 oz (52.1 kg)  ?  ?Body mass index is 22.71 kg/m?. ? ?Performance status (ECOG): 1 - Symptomatic but completely ambulatory ? ?PHYSICAL EXAM:  ? ?GENERAL:alert, no distress and comfortable ?SKIN: skin color, texture, turgor are normal, no rashes or significant lesions ?EYES: normal, Conjunctiva are pink and non-injected, sclera clear ?OROPHARYNX:no exudate, no erythema and lips, buccal mucosa,  and tongue normal  ?NECK: supple, thyroid normal size, non-tender, without nodularity ?LYMPH:  no palpable lymphadenopathy in the cervical, axillary or inguinal ?LUNGS: clear to auscultation and percussion with normal breathing effort ?HEART: regular rate & rhythm and no murmurs and no lower extremity edema ?ABDOMEN:abdomen soft, non-tender and normal bowel sounds ?Musculoskeletal:no cyanosis of digits and no clubbing  ?NEURO: alert & oriented x 3 with fluent speech, no focal motor/sensory deficits ? ?LABORATORY DATA:  ?I have reviewed the data as listed ?   ?Component Value Date/Time  ? NA 141 07/16/2021 0000  ? K 4.4 07/16/2021 0000  ? CL 107 07/16/2021 0000  ? CO2 25 (A) 07/16/2021 0000  ? GLUCOSE 98 04/27/2017 1647  ? BUN 28 (A) 07/16/2021 0000  ? CREATININE 0.9 07/16/2021 0000  ? CREATININE 1.02 (H) 04/27/2017 1647  ? CALCIUM 9.0 07/16/2021 0000  ? ALBUMIN 4.0 07/16/2021 0000  ? AST 26 07/16/2021 0000  ? ALT 16 07/16/2021 0000  ? ALKPHOS  63 07/16/2021 0000  ? GFRNONAA 50 (L) 04/27/2017 1647  ? GFRAA 57 (L) 04/27/2017 1647  ? ? ?No results found for: SPEP, UPEP ? ?Lab Results  ?Component Value Date  ? WBC 6.5 07/16/2021  ? NEUTROABS 4.49 07/16/2021  ? HGB 12.7 07/16/2021  ? HCT 39 07/16/2021  ? MCV 88 06/27/2020  ? PLT 188 07/16/2021  ? ? ?  Chemistry   ?   ?Component Value Date/Time  ? NA 141 07/16/2021 0000  ? K 4.4 07/16/2021 0000  ? CL 107 07/16/2021 0000  ? CO2 25 (A) 07/16/2021 0000  ? BUN 28 (A) 07/16/2021 0000  ? CREATININE 0.9 07/16/2021 0000  ? CREATININE 1.02 (H) 04/27/2017 1647  ? GLU 93 07/16/2021 0000  ?    ?Component Value Date/Time  ? CALCIUM 9.0 07/16/2021 0000  ? ALKPHOS 63 07/16/2021 0000  ? AST 26 07/16/2021 0000  ? ALT 16 07/16/2021 0000  ?  ? ? ? ?RADIOGRAPHIC STUDIES: ?I have personally reviewed the radiological images as listed and agreed with the findings in the report. ?No results found. ?

## 2021-07-21 DIAGNOSIS — H469 Unspecified optic neuritis: Secondary | ICD-10-CM | POA: Diagnosis not present

## 2021-07-28 DIAGNOSIS — I6523 Occlusion and stenosis of bilateral carotid arteries: Secondary | ICD-10-CM | POA: Diagnosis not present

## 2021-07-28 DIAGNOSIS — H469 Unspecified optic neuritis: Secondary | ICD-10-CM | POA: Diagnosis not present

## 2021-07-28 DIAGNOSIS — G629 Polyneuropathy, unspecified: Secondary | ICD-10-CM | POA: Diagnosis not present

## 2021-08-03 DIAGNOSIS — Z8572 Personal history of non-Hodgkin lymphomas: Secondary | ICD-10-CM | POA: Diagnosis not present

## 2021-08-03 DIAGNOSIS — G459 Transient cerebral ischemic attack, unspecified: Secondary | ICD-10-CM | POA: Diagnosis not present

## 2021-08-03 DIAGNOSIS — H538 Other visual disturbances: Secondary | ICD-10-CM | POA: Diagnosis not present

## 2021-08-19 DIAGNOSIS — H469 Unspecified optic neuritis: Secondary | ICD-10-CM | POA: Diagnosis not present

## 2021-08-25 ENCOUNTER — Ambulatory Visit (INDEPENDENT_AMBULATORY_CARE_PROVIDER_SITE_OTHER): Payer: Medicare Other

## 2021-08-25 DIAGNOSIS — I452 Bifascicular block: Secondary | ICD-10-CM

## 2021-08-25 LAB — CUP PACEART REMOTE DEVICE CHECK
Battery Remaining Longevity: 70 mo
Battery Remaining Percentage: 60 %
Battery Voltage: 2.98 V
Brady Statistic AP VP Percent: 32 %
Brady Statistic AP VS Percent: 11 %
Brady Statistic AS VP Percent: 6.1 %
Brady Statistic AS VS Percent: 51 %
Brady Statistic RA Percent Paced: 40 %
Brady Statistic RV Percent Paced: 38 %
Date Time Interrogation Session: 20230530034029
Implantable Lead Implant Date: 20190205
Implantable Lead Implant Date: 20190205
Implantable Lead Location: 753859
Implantable Lead Location: 753860
Implantable Pulse Generator Implant Date: 20190205
Lead Channel Impedance Value: 340 Ohm
Lead Channel Impedance Value: 630 Ohm
Lead Channel Pacing Threshold Amplitude: 0.5 V
Lead Channel Pacing Threshold Amplitude: 0.625 V
Lead Channel Pacing Threshold Pulse Width: 0.5 ms
Lead Channel Pacing Threshold Pulse Width: 0.5 ms
Lead Channel Sensing Intrinsic Amplitude: 2.2 mV
Lead Channel Sensing Intrinsic Amplitude: 7.5 mV
Lead Channel Setting Pacing Amplitude: 0.875
Lead Channel Setting Pacing Amplitude: 2 V
Lead Channel Setting Pacing Pulse Width: 0.5 ms
Lead Channel Setting Sensing Sensitivity: 2 mV
Pulse Gen Model: 2272
Pulse Gen Serial Number: 8993229

## 2021-08-26 DIAGNOSIS — H469 Unspecified optic neuritis: Secondary | ICD-10-CM | POA: Diagnosis not present

## 2021-09-10 NOTE — Progress Notes (Signed)
Remote pacemaker transmission.   

## 2021-09-17 DIAGNOSIS — S91001A Unspecified open wound, right ankle, initial encounter: Secondary | ICD-10-CM | POA: Diagnosis not present

## 2021-09-17 DIAGNOSIS — M25562 Pain in left knee: Secondary | ICD-10-CM | POA: Diagnosis not present

## 2021-09-28 DIAGNOSIS — S91001A Unspecified open wound, right ankle, initial encounter: Secondary | ICD-10-CM | POA: Diagnosis not present

## 2021-09-30 DIAGNOSIS — M25571 Pain in right ankle and joints of right foot: Secondary | ICD-10-CM | POA: Diagnosis not present

## 2021-09-30 DIAGNOSIS — M19071 Primary osteoarthritis, right ankle and foot: Secondary | ICD-10-CM | POA: Diagnosis not present

## 2021-09-30 DIAGNOSIS — Z981 Arthrodesis status: Secondary | ICD-10-CM | POA: Diagnosis not present

## 2021-09-30 DIAGNOSIS — L02415 Cutaneous abscess of right lower limb: Secondary | ICD-10-CM | POA: Diagnosis not present

## 2021-10-06 DIAGNOSIS — M1712 Unilateral primary osteoarthritis, left knee: Secondary | ICD-10-CM | POA: Diagnosis not present

## 2021-10-07 DIAGNOSIS — S81801A Unspecified open wound, right lower leg, initial encounter: Secondary | ICD-10-CM | POA: Diagnosis not present

## 2021-10-08 DIAGNOSIS — H47011 Ischemic optic neuropathy, right eye: Secondary | ICD-10-CM | POA: Diagnosis not present

## 2021-10-13 DIAGNOSIS — Z8572 Personal history of non-Hodgkin lymphomas: Secondary | ICD-10-CM | POA: Diagnosis not present

## 2021-10-13 DIAGNOSIS — K7689 Other specified diseases of liver: Secondary | ICD-10-CM | POA: Diagnosis not present

## 2021-10-13 DIAGNOSIS — C8213 Follicular lymphoma grade II, intra-abdominal lymph nodes: Secondary | ICD-10-CM | POA: Diagnosis not present

## 2021-10-13 DIAGNOSIS — S22060A Wedge compression fracture of T7-T8 vertebra, initial encounter for closed fracture: Secondary | ICD-10-CM | POA: Diagnosis not present

## 2021-10-13 DIAGNOSIS — S22080A Wedge compression fracture of T11-T12 vertebra, initial encounter for closed fracture: Secondary | ICD-10-CM | POA: Diagnosis not present

## 2021-10-13 DIAGNOSIS — K5730 Diverticulosis of large intestine without perforation or abscess without bleeding: Secondary | ICD-10-CM | POA: Diagnosis not present

## 2021-10-13 DIAGNOSIS — N2889 Other specified disorders of kidney and ureter: Secondary | ICD-10-CM | POA: Diagnosis not present

## 2021-10-13 DIAGNOSIS — S22050A Wedge compression fracture of T5-T6 vertebra, initial encounter for closed fracture: Secondary | ICD-10-CM | POA: Diagnosis not present

## 2021-10-13 LAB — BASIC METABOLIC PANEL
BUN: 20 (ref 4–21)
CO2: 29 — AB (ref 13–22)
Chloride: 104 (ref 99–108)
Creatinine: 0.9 (ref 0.5–1.1)
Glucose: 107
Potassium: 3.9 mEq/L (ref 3.5–5.1)
Sodium: 139 (ref 137–147)

## 2021-10-13 LAB — CBC AND DIFFERENTIAL
HCT: 41 (ref 36–46)
Hemoglobin: 13.7 (ref 12.0–16.0)
Neutrophils Absolute: 7.11
Platelets: 210 10*3/uL (ref 150–400)
WBC: 9

## 2021-10-13 LAB — COMPREHENSIVE METABOLIC PANEL
Albumin: 4.2 (ref 3.5–5.0)
Calcium: 9.1 (ref 8.7–10.7)

## 2021-10-13 LAB — HEPATIC FUNCTION PANEL
ALT: 16 U/L (ref 7–35)
AST: 21 (ref 13–35)
Alkaline Phosphatase: 66 (ref 25–125)
Bilirubin, Total: 0.4

## 2021-10-13 LAB — CBC: RBC: 4.77 (ref 3.87–5.11)

## 2021-10-14 ENCOUNTER — Other Ambulatory Visit: Payer: Self-pay | Admitting: Hematology and Oncology

## 2021-10-15 ENCOUNTER — Other Ambulatory Visit: Payer: Self-pay | Admitting: Oncology

## 2021-10-15 ENCOUNTER — Inpatient Hospital Stay: Payer: Medicare Other | Attending: Oncology | Admitting: Oncology

## 2021-10-15 ENCOUNTER — Inpatient Hospital Stay: Payer: Medicare Other

## 2021-10-15 ENCOUNTER — Encounter: Payer: Self-pay | Admitting: Oncology

## 2021-10-15 ENCOUNTER — Encounter: Payer: Self-pay | Admitting: Hematology and Oncology

## 2021-10-15 DIAGNOSIS — R350 Frequency of micturition: Secondary | ICD-10-CM | POA: Diagnosis not present

## 2021-10-15 DIAGNOSIS — C8213 Follicular lymphoma grade II, intra-abdominal lymph nodes: Secondary | ICD-10-CM

## 2021-10-15 DIAGNOSIS — Z8572 Personal history of non-Hodgkin lymphomas: Secondary | ICD-10-CM | POA: Diagnosis not present

## 2021-10-15 LAB — URINALYSIS, COMPLETE (UACMP) WITH MICROSCOPIC
Bacteria, UA: NONE SEEN
Bilirubin Urine: NEGATIVE
Glucose, UA: NEGATIVE mg/dL
Ketones, ur: NEGATIVE mg/dL
Nitrite: POSITIVE — AB
Protein, ur: NEGATIVE mg/dL
Specific Gravity, Urine: 1.025 (ref 1.005–1.030)
WBC, UA: >50 WBC/hpf — ABNORMAL HIGH (ref 0–5)
pH: 5 (ref 5.0–8.0)

## 2021-10-15 NOTE — Progress Notes (Signed)
Julia Hunt  [address] [place],  [place]  [address] [phone]  Clinic Day:  10/15/21  Referring physician: Nicoletta Dress, MD ASSESSMENT & PLAN:   Assessment & Plan: Follicular lymphoma grade II of intra-abdominal lymph nodes ([place]) CT chest, abdomen and pelvis in July 2022 revealed with stable lymphadenopathy, but a slight increase in the pulmonary nodules. We decided against further evaluation with PET. CT imaging from January 2023 is stable to slightly improved. Repeat CT from July 2023 is stable with decrease in left peri-aortic node.  Suspected UTI Since she is symptomatic, I will start Cipro and follow up on urine culture next week.  As recent CT imaging remains stable, we will plan to repeat imaging in a year.. We will plan to see her back in 6 months with a CBC, comprehensive metabolic panel and LDH for repeat clinical assessment. The patient and her son understand the plans discussed today and are in agreement with them.  They knows to contact our office if she develops concerns prior to her next appointment.  I provided 20 minutes of face-to-face time during this this encounter and > 50% was spent counseling as documented under my assessment and plan.    Julia Kaplan, MD  [facility] [address] [place] [address] Dept: [phone] Dept Fax: [phone]   ADDENDUM: The urine culture does show infection with >100,000 col/ml  of E coli, resistant to Cipro and ampicillin, so we will have her take the [place].     CHIEF COMPLAINT:  CC: Grade II follicular  Current Treatment:  Observation   HISTORY OF PRESENT ILLNESS:   Oncology History  Follicular lymphoma grade II of intra-abdominal lymph nodes ([name])  11/13/2019 Initial Diagnosis   Follicular lymphoma grade II of intra-abdominal lymph nodes ([name])   12/14/2019 Cancer Staging   Staging form:  Hodgkin and Non-Hodgkin Lymphoma, AJCC 8th Edition - Clinical stage from 7/82/4235: Stage I (Follicular lymphoma) - Signed by Julia Kaplan, MD on 01/31/2020      INTERVAL HISTORY:  Julia Hunt is here for routine follow up and states that she has been fairly well. She reports of a sore on dorsum of right anklet. Dr.Morgan has been treating but has not seen any improvements. 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 She can see but only see the lower half of certain things. She has had been going to the bathroom frequently and not much has come out for the past 2-3 days. She denies any blood in the urine. She does feel a little burn when urinating. We will get a urine analysis/culture. She denies any discharge or itching.  She does report acid reflux. She reports pain in her left side. She also complains of nausea.. She had taken Vibramycin medication. She continues to take 1 baby Asprin. The results of her scan shows some spots in the lung but have not grown. The left peri-aortic lymph node decreased from 3.4 cm to 2.5 cm. We can now start scans once a year. Her labs are unremarkable. The only minor concern is her being dry, and possible UTI. Her blood counts are unremarkable. Her  appetite is fair.. She denies fever, chills or other signs of infection.  She denies nausea, vomiting, bowel issues, or abdominal pain.  She denies sore throat, cough, dyspnea, or chest pain.  REVIEW OF SYSTEMS:  Review of Systems  Constitutional:  Positive for fatigue. Negative for appetite change, chills, fever and unexpected weight change.  HENT:  Negative.    Eyes: Negative.   Respiratory: Negative.  Negative for chest tightness, cough, hemoptysis, shortness of breath and wheezing.   Cardiovascular: Negative.  Negative for chest pain, leg swelling and palpitations.  Gastrointestinal: Negative.  Negative for abdominal distention, abdominal pain, blood in stool, constipation, diarrhea,  nausea and vomiting.  Endocrine: Negative.   Genitourinary: Negative.  Negative for difficulty urinating, dysuria, frequency and hematuria.   Musculoskeletal: Negative.  Negative for arthralgias, back pain, flank pain, gait problem and myalgias.  Skin: Negative.   Neurological:  Positive for numbness (first two fingers of the right hand). Negative for dizziness, extremity weakness, gait problem, headaches, light-headedness, seizures and speech difficulty.  Hematological: Negative.   Psychiatric/Behavioral: Negative.  Negative for depression and sleep disturbance. The patient is not nervous/anxious.      VITALS:  Blood pressure 128/63, pulse 69, temperature 98 F (36.7 C), temperature source Oral, resp. rate 18, height 5' (1.524 m), weight 112 lb 6.4 oz (51 kg), SpO2 95 %.  Wt Readings from Last 3 Encounters:  10/15/21 112 lb 6.4 oz (51 kg)  07/16/21 116 lb 4.8 oz (52.8 kg)  07/06/21 118 lb 3.2 oz (53.6 kg)    Body mass index is 21.95 kg/m.  Performance status (ECOG): 1 - Symptomatic but completely ambulatory  PHYSICAL EXAM:  Physical Exam Constitutional:      General: She is not in acute distress.    Appearance: Normal appearance. She is normal weight.  HENT:     Head: Normocephalic and atraumatic.  Eyes:     General: No scleral icterus.    Extraocular Movements: Extraocular movements intact.     Conjunctiva/sclera: Conjunctivae normal.     Pupils: Pupils are equal, round, and reactive to light.  Cardiovascular:     Rate and Rhythm: Normal rate and regular rhythm.     Pulses: Normal pulses.     Heart sounds: Normal heart sounds. No murmur heard.    No friction rub. No gallop.  Pulmonary:     Effort: Pulmonary effort is normal. No respiratory distress.     Breath sounds: Normal breath sounds.  Abdominal:     General: Bowel sounds are normal. There is no distension.     Palpations: Abdomen is soft. There is no hepatomegaly, splenomegaly or mass.     Tenderness: There is no  abdominal tenderness.  Musculoskeletal:        General: Normal range of motion.     Cervical back: Normal range of motion and neck supple.     Right lower leg: No edema.     Left lower leg: No edema.     Comments: Brace of the right leg  Lymphadenopathy:     Cervical: No cervical adenopathy.  Skin:    General: Skin is warm and dry.  Neurological:     General: No focal deficit present.     Mental Status: She is alert and oriented to person, place, and time. Mental status is at baseline.  Psychiatric:        Mood and Affect: Mood normal.        Behavior: Behavior normal.        Thought Content: Thought content normal.        Judgment: Judgment normal.     LABS:      Latest Ref Rng & Units 07/16/2021   12:00 AM 04/14/2021   12:00  AM 01/14/2021   12:00 AM  CBC  WBC  6.5     6.4  10.4      Hemoglobin 12.0 - 16.0 12.7     13.0  14.3      Hematocrit 36 - 46 39     40  43      Platelets 150 - 400 K/uL 188     207  171         This result is from an external source.      Latest Ref Rng & Units 07/16/2021   12:00 AM 04/14/2021   12:00 AM 01/14/2021   12:00 AM  CMP  BUN 4 - '21 28     17  19      '$ Creatinine 0.5 - 1.1 0.9     0.8  1.0      Sodium 137 - 147 141     138  142      Potassium 3.5 - 5.1 mEq/L 4.4     4.0  3.9      Chloride 99 - 108 107     103  107      CO2 13 - '22 25     29  25      '$ Calcium 8.7 - 10.7 9.0     8.8  8.7      Alkaline Phos 25 - 125 63     49  69      AST 13 - 35 '26     24  26      '$ ALT 7 - 35 U/L '16     17  14         '$ This result is from an external source.     Lab Results  Component Value Date   CEA1 5.3 (H) 10/01/2020   /  CEA  Date Value Ref Range Status  10/01/2020 5.3 (H) 0.0 - 4.7 ng/mL Final    Comment:    (NOTE)                             Nonsmokers          <3.9                             Smokers             <5.6 [employer] Electrochemiluminescence Immunoassay (ECLIA) Values obtained with different assay methods or  kits cannot be used interchangeably.  Results cannot be interpreted as absolute evidence of the presence or absence of malignant disease. Performed At: [facility] [place], [place] [id number] Rush Farmer MD [id number]     Lab Results  Component Value Date   LDH 181 07/16/2021   LDH 175 01/14/2021   LDH 549 10/01/2020    STUDIES:  CT chest, abdomen and pelvis 10/13/2021  IMPRESSION: Soft tissue nodular densities in the lungs without significant interval change.  No mediastinal mass or significant lymphadenopathy. 2.  Left periaortic lymph node mass at the level of the left renal vein measures 2.5 x 2 cm, in comparison to 3.4 x 2.2 cm in the previous study.  No new lymph nodes are seen. 3.  Stable multiple hepatic cystic lesions. 4.  Moderately severe diverticulosis of the sigmoid colon without diverticulitis.  Moderately large stool burden in the sigmoid colon and rectosigmoid. 5.  Stable T5 and T6  chronic vertebral fractures and stable multiple vertebral augmentations.  No new fracture is seen.   HISTORY:   Allergies:  Allergies  Allergen Reactions   Clarithromycin Rash   Penicillins Rash    Has patient had a PCN reaction causing immediate rash, facial/tongue/throat swelling, SOB or lightheadedness with hypotension: Unknown Has patient had a PCN reaction causing severe rash involving mucus membranes or skin necrosis: Unknown Has patient had a PCN reaction that required hospitalization: No Has patient had a PCN reaction occurring within the last 10 years: No If all of the above answers are "NO", then may proceed with Cephalosporin use.    Sulfa Antibiotics Rash    Current Medications: Current Outpatient Medications  Medication Sig Dispense Refill   atorvastatin (LIPITOR) 40 MG tablet      Calcium Carbonate-Vitamin D 600-400 MG-UNIT tablet Take 1 tablet by mouth 2 (two) times daily.      ciprofloxacin (CIPRO) 250 MG tablet Take 1 tablet (250  mg total) by mouth 2 (two) times daily. 10 tablet 0   levothyroxine (SYNTHROID) 88 MCG tablet Take 88 mcg by mouth every other day. Rotates it with 8mg every other day     levothyroxine (SYNTHROID, LEVOTHROID) 75 MCG tablet Take 75 mcg by mouth every other day. Rotates every other day with 869m     Multiple Vitamin (MULTIVITAMIN) capsule Take 1 capsule by mouth daily.     nitrofurantoin, macrocrystal-monohydrate, (MACROBID) 100 MG capsule Take 1 capsule (100 mg total) by mouth 2 (two) times daily. 20 capsule 0   omeprazole (PRILOSEC) 20 MG capsule Take 20 mg by mouth as needed (heartburn).     PROLIA 60 MG/ML SOSY injection Inject 60 mg into the skin every 6 (six) months.     sertraline (ZOLOFT) 100 MG tablet Take 100 mg by mouth daily.     traMADol (ULTRAM) 50 MG tablet Take 50 mg by mouth daily.   0   No current facility-administered medications for this visit.      I,Gabriella Ballesteros,acting as a scribe for ChDerwood KaplanMD.,have documented all relevant documentation on the behalf of ChDerwood KaplanMD,as directed by  ChDerwood KaplanMD while in the presence of ChDerwood KaplanMD.   I have reviewed this report as typed by the medical scribe, and it is complete and accurate.

## 2021-10-16 ENCOUNTER — Other Ambulatory Visit: Payer: Self-pay | Admitting: Oncology

## 2021-10-16 ENCOUNTER — Telehealth: Payer: Self-pay

## 2021-10-16 DIAGNOSIS — N3000 Acute cystitis without hematuria: Secondary | ICD-10-CM

## 2021-10-16 MED ORDER — CIPROFLOXACIN HCL 250 MG PO TABS: 250.0000 mg | ORAL_TABLET | Freq: Two times a day (BID) | ORAL | 0 refills | Status: DC

## 2021-10-16 NOTE — Telephone Encounter (Signed)
Son Marcello Moores notified of the medication and that the patient has an infection in her urine. Son Marcello Moores states they will go to the pharmacy pick the medication up/

## 2021-10-16 NOTE — Telephone Encounter (Signed)
-----   Message from Derwood Kaplan, MD sent at 10/16/2021  1:22 PM EDT ----- Urine sure looks infected, pls check for cult Monday but I should prob start her on something now Tell her I will send in ab ----- Message ----- From: Interface, Lab In [place] Sent: 10/15/2021   5:46 PM EDT To: Derwood Kaplan, MD

## 2021-10-17 LAB — URINE CULTURE: Culture: >=100000 — AB

## 2021-10-18 ENCOUNTER — Other Ambulatory Visit: Payer: Self-pay | Admitting: Oncology

## 2021-10-18 DIAGNOSIS — A499 Bacterial infection, unspecified: Secondary | ICD-10-CM

## 2021-10-18 DIAGNOSIS — N390 Urinary tract infection, site not specified: Secondary | ICD-10-CM

## 2021-10-18 MED ORDER — NITROFURANTOIN MONOHYD MACRO 100 MG PO CAPS: 100.0000 mg | ORAL_CAPSULE | Freq: Two times a day (BID) | ORAL | 0 refills | Status: DC

## 2021-10-19 ENCOUNTER — Telehealth: Payer: Self-pay

## 2021-10-19 NOTE — Telephone Encounter (Signed)
-----   Message from Derwood Kaplan, MD sent at 10/18/2021  4:59 PM EDT ----- Regarding: call Tell her she does have a UTI but resistant to Cipro so stop that. I will send in Macrobid bid x 10 days

## 2021-11-04 DIAGNOSIS — S91301D Unspecified open wound, right foot, subsequent encounter: Secondary | ICD-10-CM | POA: Diagnosis not present

## 2021-11-19 ENCOUNTER — Other Ambulatory Visit (HOSPITAL_COMMUNITY): Payer: Self-pay | Admitting: Orthopaedic Surgery

## 2021-11-19 ENCOUNTER — Other Ambulatory Visit: Payer: Self-pay | Admitting: Orthopaedic Surgery

## 2021-11-19 DIAGNOSIS — M25571 Pain in right ankle and joints of right foot: Secondary | ICD-10-CM | POA: Diagnosis not present

## 2021-11-19 DIAGNOSIS — M79671 Pain in right foot: Secondary | ICD-10-CM | POA: Diagnosis not present

## 2021-11-19 DIAGNOSIS — G7249 Other inflammatory and immune myopathies, not elsewhere classified: Secondary | ICD-10-CM | POA: Diagnosis not present

## 2021-11-19 DIAGNOSIS — L02415 Cutaneous abscess of right lower limb: Secondary | ICD-10-CM | POA: Diagnosis not present

## 2021-11-24 ENCOUNTER — Ambulatory Visit (INDEPENDENT_AMBULATORY_CARE_PROVIDER_SITE_OTHER): Payer: Medicare Other

## 2021-11-24 DIAGNOSIS — R55 Syncope and collapse: Secondary | ICD-10-CM | POA: Diagnosis not present

## 2021-11-24 LAB — CUP PACEART REMOTE DEVICE CHECK
Battery Remaining Longevity: 67 mo
Battery Remaining Percentage: 58 %
Battery Voltage: 2.99 V
Brady Statistic AP VP Percent: 34 %
Brady Statistic AP VS Percent: 11 %
Brady Statistic AS VP Percent: 6.2 %
Brady Statistic AS VS Percent: 48 %
Brady Statistic RA Percent Paced: 43 %
Brady Statistic RV Percent Paced: 40 %
Date Time Interrogation Session: 20230829022335
Implantable Lead Implant Date: 20190205
Implantable Lead Implant Date: 20190205
Implantable Lead Location: 753859
Implantable Lead Location: 753860
Implantable Pulse Generator Implant Date: 20190205
Lead Channel Impedance Value: 380 Ohm
Lead Channel Impedance Value: 590 Ohm
Lead Channel Pacing Threshold Amplitude: 0.5 V
Lead Channel Pacing Threshold Amplitude: 0.5 V
Lead Channel Pacing Threshold Pulse Width: 0.5 ms
Lead Channel Pacing Threshold Pulse Width: 0.5 ms
Lead Channel Sensing Intrinsic Amplitude: 2.6 mV
Lead Channel Sensing Intrinsic Amplitude: 6.8 mV
Lead Channel Setting Pacing Amplitude: 0.75 V
Lead Channel Setting Pacing Amplitude: 2 V
Lead Channel Setting Pacing Pulse Width: 0.5 ms
Lead Channel Setting Sensing Sensitivity: 2 mV
Pulse Gen Model: 2272
Pulse Gen Serial Number: 8993229

## 2021-12-01 DIAGNOSIS — H538 Other visual disturbances: Secondary | ICD-10-CM | POA: Diagnosis not present

## 2021-12-01 DIAGNOSIS — H1132 Conjunctival hemorrhage, left eye: Secondary | ICD-10-CM | POA: Diagnosis not present

## 2021-12-07 DIAGNOSIS — H524 Presbyopia: Secondary | ICD-10-CM | POA: Diagnosis not present

## 2021-12-07 DIAGNOSIS — H43811 Vitreous degeneration, right eye: Secondary | ICD-10-CM | POA: Diagnosis not present

## 2021-12-07 DIAGNOSIS — H47011 Ischemic optic neuropathy, right eye: Secondary | ICD-10-CM | POA: Diagnosis not present

## 2021-12-09 ENCOUNTER — Ambulatory Visit (HOSPITAL_COMMUNITY)
Admission: RE | Admit: 2021-12-09 | Discharge: 2021-12-09 | Disposition: A | Discharge status: Home / Self Care | Payer: Medicare Other | Source: Ambulatory Visit | Attending: Orthopaedic Surgery | Admitting: Orthopaedic Surgery

## 2021-12-09 DIAGNOSIS — M25571 Pain in right ankle and joints of right foot: Secondary | ICD-10-CM | POA: Insufficient documentation

## 2021-12-09 DIAGNOSIS — Z981 Arthrodesis status: Secondary | ICD-10-CM | POA: Diagnosis not present

## 2021-12-09 DIAGNOSIS — M19071 Primary osteoarthritis, right ankle and foot: Secondary | ICD-10-CM | POA: Diagnosis not present

## 2021-12-09 MED ORDER — GADOBUTROL 1 MMOL/ML IV SOLN
5.0000 mL | Freq: Once | INTRAVENOUS | Status: AC | PRN
Start: 2021-12-09 — End: 2021-12-09
  Administered 2021-12-09: 5 mL via INTRAVENOUS

## 2021-12-09 NOTE — Progress Notes (Signed)
Per order, Changed device settings for MRI to  DOO at 95 bpm  Will program device back to pre-MRI settings after completion of exam, and send transmission

## 2021-12-18 NOTE — Progress Notes (Signed)
Remote pacemaker transmission.   

## 2021-12-23 DIAGNOSIS — K222 Esophageal obstruction: Secondary | ICD-10-CM | POA: Diagnosis not present

## 2021-12-23 DIAGNOSIS — M810 Age-related osteoporosis without current pathological fracture: Secondary | ICD-10-CM | POA: Diagnosis not present

## 2021-12-23 DIAGNOSIS — Z23 Encounter for immunization: Secondary | ICD-10-CM | POA: Diagnosis not present

## 2021-12-23 DIAGNOSIS — M546 Pain in thoracic spine: Secondary | ICD-10-CM | POA: Diagnosis not present

## 2021-12-23 DIAGNOSIS — K219 Gastro-esophageal reflux disease without esophagitis: Secondary | ICD-10-CM | POA: Diagnosis not present

## 2021-12-23 DIAGNOSIS — M5137 Other intervertebral disc degeneration, lumbosacral region: Secondary | ICD-10-CM | POA: Diagnosis not present

## 2021-12-23 DIAGNOSIS — E785 Hyperlipidemia, unspecified: Secondary | ICD-10-CM | POA: Diagnosis not present

## 2021-12-23 DIAGNOSIS — D509 Iron deficiency anemia, unspecified: Secondary | ICD-10-CM | POA: Diagnosis not present

## 2021-12-23 DIAGNOSIS — M5136 Other intervertebral disc degeneration, lumbar region: Secondary | ICD-10-CM | POA: Diagnosis not present

## 2021-12-23 DIAGNOSIS — E039 Hypothyroidism, unspecified: Secondary | ICD-10-CM | POA: Diagnosis not present

## 2021-12-29 DIAGNOSIS — Z981 Arthrodesis status: Secondary | ICD-10-CM | POA: Diagnosis not present

## 2022-01-12 DIAGNOSIS — M25571 Pain in right ankle and joints of right foot: Secondary | ICD-10-CM | POA: Diagnosis not present

## 2022-01-14 ENCOUNTER — Telehealth: Payer: Self-pay

## 2022-01-14 ENCOUNTER — Other Ambulatory Visit (HOSPITAL_COMMUNITY): Payer: Self-pay | Admitting: Orthopaedic Surgery

## 2022-01-14 DIAGNOSIS — G8929 Other chronic pain: Secondary | ICD-10-CM

## 2022-01-14 NOTE — Patient Outreach (Signed)
  Care Coordination   Initial Visit Note   01/14/2022 Name: Julia Hunt MRN: [id number] DOB: April 03, 1927  Julia Hunt is a 88 y.o. year old female who sees Nicoletta Dress, MD for primary care. I spoke with  Julia Hunt by phone today.  What matters to the patients health and wellness today?  Placed call to patient to offer [facility] care coordination program. Patient reports to me that she has too many case managers now and then she hung up without allowing me to speak.   SDOH assessments and interventions completed:  No     Care Coordination Interventions Activated:  No  Care Coordination Interventions:  No, not indicated   Follow up plan: No further intervention required.   Encounter Outcome:  Pt. Refused   Tomasa Rand, RN, BSN, CEN [facility] [employer] [phone]

## 2022-01-28 ENCOUNTER — Ambulatory Visit (HOSPITAL_COMMUNITY)
Admission: RE | Admit: 2022-01-28 | Discharge: 2022-01-28 | Disposition: A | Discharge status: Home / Self Care | Payer: Medicare Other | Source: Ambulatory Visit | Attending: Cardiology | Admitting: Cardiology

## 2022-01-28 DIAGNOSIS — M25571 Pain in right ankle and joints of right foot: Secondary | ICD-10-CM | POA: Diagnosis not present

## 2022-01-28 DIAGNOSIS — G8929 Other chronic pain: Secondary | ICD-10-CM

## 2022-02-19 DIAGNOSIS — G5601 Carpal tunnel syndrome, right upper limb: Secondary | ICD-10-CM | POA: Diagnosis not present

## 2022-02-19 DIAGNOSIS — M65321 Trigger finger, right index finger: Secondary | ICD-10-CM | POA: Diagnosis not present

## 2022-02-19 DIAGNOSIS — S8012XA Contusion of left lower leg, initial encounter: Secondary | ICD-10-CM | POA: Diagnosis not present

## 2022-02-23 ENCOUNTER — Ambulatory Visit (INDEPENDENT_AMBULATORY_CARE_PROVIDER_SITE_OTHER): Payer: Medicare Other

## 2022-02-23 DIAGNOSIS — I452 Bifascicular block: Secondary | ICD-10-CM

## 2022-02-23 LAB — CUP PACEART REMOTE DEVICE CHECK
Battery Remaining Longevity: 62 mo
Battery Remaining Percentage: 55 %
Battery Voltage: 2.98 V
Brady Statistic AP VP Percent: 38 %
Brady Statistic AP VS Percent: 8.2 %
Brady Statistic AS VP Percent: 5.6 %
Brady Statistic AS VS Percent: 48 %
Brady Statistic RA Percent Paced: 45 %
Brady Statistic RV Percent Paced: 44 %
Date Time Interrogation Session: 20231128040510
Implantable Lead Connection Status: 753985
Implantable Lead Connection Status: 753985
Implantable Lead Implant Date: 20190205
Implantable Lead Implant Date: 20190205
Implantable Lead Location: 753859
Implantable Lead Location: 753860
Implantable Pulse Generator Implant Date: 20190205
Lead Channel Impedance Value: 340 Ohm
Lead Channel Impedance Value: 540 Ohm
Lead Channel Pacing Threshold Amplitude: 0.25 V
Lead Channel Pacing Threshold Amplitude: 0.625 V
Lead Channel Pacing Threshold Pulse Width: 0.5 ms
Lead Channel Pacing Threshold Pulse Width: 0.5 ms
Lead Channel Sensing Intrinsic Amplitude: 2 mV
Lead Channel Sensing Intrinsic Amplitude: 7.8 mV
Lead Channel Setting Pacing Amplitude: 0.875
Lead Channel Setting Pacing Amplitude: 2 V
Lead Channel Setting Pacing Pulse Width: 0.5 ms
Lead Channel Setting Sensing Sensitivity: 2 mV
Pulse Gen Model: 2272
Pulse Gen Serial Number: 8993229

## 2022-03-17 DIAGNOSIS — R350 Frequency of micturition: Secondary | ICD-10-CM | POA: Diagnosis not present

## 2022-03-17 DIAGNOSIS — S91301A Unspecified open wound, right foot, initial encounter: Secondary | ICD-10-CM | POA: Diagnosis not present

## 2022-03-19 DIAGNOSIS — Z20822 Contact with and (suspected) exposure to covid-19: Secondary | ICD-10-CM | POA: Diagnosis not present

## 2022-03-19 DIAGNOSIS — Z743 Need for continuous supervision: Secondary | ICD-10-CM | POA: Diagnosis not present

## 2022-03-19 DIAGNOSIS — E785 Hyperlipidemia, unspecified: Secondary | ICD-10-CM | POA: Diagnosis not present

## 2022-03-19 DIAGNOSIS — R531 Weakness: Secondary | ICD-10-CM | POA: Diagnosis not present

## 2022-03-19 DIAGNOSIS — Z950 Presence of cardiac pacemaker: Secondary | ICD-10-CM | POA: Diagnosis not present

## 2022-03-19 DIAGNOSIS — Z8673 Personal history of transient ischemic attack (TIA), and cerebral infarction without residual deficits: Secondary | ICD-10-CM | POA: Diagnosis not present

## 2022-03-19 DIAGNOSIS — B3731 Acute candidiasis of vulva and vagina: Secondary | ICD-10-CM | POA: Diagnosis not present

## 2022-03-19 DIAGNOSIS — K219 Gastro-esophageal reflux disease without esophagitis: Secondary | ICD-10-CM | POA: Diagnosis not present

## 2022-03-19 DIAGNOSIS — Z79899 Other long term (current) drug therapy: Secondary | ICD-10-CM | POA: Diagnosis not present

## 2022-03-19 DIAGNOSIS — R5383 Other fatigue: Secondary | ICD-10-CM | POA: Diagnosis not present

## 2022-03-19 DIAGNOSIS — R109 Unspecified abdominal pain: Secondary | ICD-10-CM | POA: Diagnosis not present

## 2022-03-19 DIAGNOSIS — E039 Hypothyroidism, unspecified: Secondary | ICD-10-CM | POA: Diagnosis not present

## 2022-03-19 DIAGNOSIS — I10 Essential (primary) hypertension: Secondary | ICD-10-CM | POA: Diagnosis not present

## 2022-03-19 DIAGNOSIS — N390 Urinary tract infection, site not specified: Secondary | ICD-10-CM | POA: Diagnosis not present

## 2022-03-19 DIAGNOSIS — R52 Pain, unspecified: Secondary | ICD-10-CM | POA: Diagnosis not present

## 2022-03-19 DIAGNOSIS — M791 Myalgia, unspecified site: Secondary | ICD-10-CM | POA: Diagnosis not present

## 2022-03-19 DIAGNOSIS — I4891 Unspecified atrial fibrillation: Secondary | ICD-10-CM | POA: Diagnosis not present

## 2022-03-25 DIAGNOSIS — I219 Acute myocardial infarction, unspecified: Secondary | ICD-10-CM | POA: Diagnosis not present

## 2022-03-25 DIAGNOSIS — R5383 Other fatigue: Secondary | ICD-10-CM | POA: Diagnosis not present

## 2022-03-25 DIAGNOSIS — I639 Cerebral infarction, unspecified: Secondary | ICD-10-CM | POA: Diagnosis not present

## 2022-03-25 NOTE — Progress Notes (Signed)
Remote pacemaker transmission.   

## 2022-04-01 DIAGNOSIS — B3731 Acute candidiasis of vulva and vagina: Secondary | ICD-10-CM | POA: Diagnosis not present

## 2022-04-01 DIAGNOSIS — K222 Esophageal obstruction: Secondary | ICD-10-CM | POA: Diagnosis not present

## 2022-04-01 DIAGNOSIS — R531 Weakness: Secondary | ICD-10-CM | POA: Diagnosis not present

## 2022-04-01 DIAGNOSIS — M810 Age-related osteoporosis without current pathological fracture: Secondary | ICD-10-CM | POA: Diagnosis not present

## 2022-04-01 DIAGNOSIS — K219 Gastro-esophageal reflux disease without esophagitis: Secondary | ICD-10-CM | POA: Diagnosis not present

## 2022-04-01 DIAGNOSIS — D509 Iron deficiency anemia, unspecified: Secondary | ICD-10-CM | POA: Diagnosis not present

## 2022-04-01 DIAGNOSIS — E785 Hyperlipidemia, unspecified: Secondary | ICD-10-CM | POA: Diagnosis not present

## 2022-04-01 DIAGNOSIS — E039 Hypothyroidism, unspecified: Secondary | ICD-10-CM | POA: Diagnosis not present

## 2022-04-01 DIAGNOSIS — M5137 Other intervertebral disc degeneration, lumbosacral region: Secondary | ICD-10-CM | POA: Diagnosis not present

## 2022-04-29 ENCOUNTER — Telehealth: Payer: Self-pay | Admitting: Oncology

## 2022-04-29 NOTE — Telephone Encounter (Signed)
Hello,  Pt called to schedule a follow up appt but was wondering if any scans would be needed prior to seeing you for her visit that was scheduled for 05/13/22. Please advise,  Thank you.  Damaris F.

## 2022-05-11 NOTE — Progress Notes (Signed)
[facility]  [address] [place],  [place]  [address] [phone]  Clinic Day:  05/12/22   Referring physician: Nicoletta Dress, MD ASSESSMENT & PLAN:   Assessment & Plan: Follicular lymphoma grade II of intra-abdominal lymph nodes ([place]) CT chest, abdomen and pelvis in July 2022 revealed with stable lymphadenopathy, but a slight increase in the pulmonary nodules. We decided against further evaluation with PET. CT imaging from January 2023 is stable to slightly improved. Repeat CT from July 2023 is stable with decrease in the left peri-aortic node.  Non-healing wound of anterior left ankle She has a history of multiple surgeries and skin grafting years ago but now has a persistent draining wound. I recommend a consultation with the wound center.   As prior CT imaging remained stable, and she is really asymptomatic from her lymphoma, we will plan to repeat imaging in a year. I will make a referral to the [facility]. We will plan to see her back in 6 months with a CBC, comprehensive metabolic panel, LDH and CT scans of chest, abdomen and pelvis for repeat clinical assessment. The patient and her son understand the plans discussed today and are in agreement with them.  They knows to contact our office if she develops concerns prior to her next appointment.  I provided 20 minutes of face-to-face time during this this encounter and > 50% was spent counseling as documented under my assessment and plan.    Derwood Kaplan, MD  [facility] [address] [place] [address] Dept: [phone] Dept Fax: [phone]      CHIEF COMPLAINT:  CC: Grade II follicular  Current Treatment:  Observation   HISTORY OF PRESENT ILLNESS:   Oncology History  Follicular lymphoma grade II of intra-abdominal lymph nodes ([place])  11/13/2019 Initial Diagnosis   Follicular lymphoma grade II of  intra-abdominal lymph nodes ([place])   12/14/2019 Cancer Staging   Staging form: Hodgkin and Non-Hodgkin Lymphoma, AJCC 8th Edition - Clinical stage from [id number]: Stage I (Follicular lymphoma) - Signed by Derwood Kaplan, MD on 01/31/2020      INTERVAL HISTORY:  Julia Hunt is here for routine follow up and states that she has been fairly well. She does complain of fatigue. She reports of a sore on dorsum of the right ankle. Dr.Morgan had been treating but then referred elsewhere. This is the leg that is atrophied from prior polio and she has had numerous surgeries in the past on the foot and ankle, including skin grafting. I cannot feel good pulses and she has atrophy of that leg. The wound is draining and has persisted for many months. I suggested a consultation with the Wound center.  She continues to take 1 baby Asprin. Her last CT in July still shows small unchanged nodules in the lung and the left peri-aortic lymph node decreased from 3.4 cm to 2.5 cm. We will repeat that once a year. Her labs are unremarkable.  Her  appetite is fair.. She denies fever, chills or other signs of infection.  She denies nausea, vomiting, bowel issues, or abdominal pain.  She denies sore throat, cough, dyspnea, or chest pain.  REVIEW OF SYSTEMS:  Review of Systems  Constitutional:  Positive for fatigue. Negative for appetite change, chills, fever and unexpected weight change.  HENT:  Negative.    Eyes: Negative.   Respiratory: Negative.  Negative for chest tightness, cough, hemoptysis, shortness of breath  and wheezing.   Cardiovascular: Negative.  Negative for chest pain, leg swelling and palpitations.  Gastrointestinal: Negative.  Negative for abdominal distention, abdominal pain, blood in stool, constipation, diarrhea, nausea and vomiting.  Endocrine: Negative.   Genitourinary: Negative.  Negative for difficulty urinating, dysuria, frequency and hematuria.   Musculoskeletal: Negative.  Negative for arthralgias,  back pain, flank pain, gait problem and myalgias.  Skin: Negative.   Neurological:  Positive for numbness (first two fingers of the right hand). Negative for dizziness, extremity weakness, gait problem, headaches, light-headedness, seizures and speech difficulty.  Hematological: Negative.   Psychiatric/Behavioral: Negative.  Negative for depression and sleep disturbance. The patient is not nervous/anxious.      VITALS:  Blood pressure (!) 129/93, pulse 83, temperature 98.8 F (37.1 C), temperature source Oral, resp. rate 19, height 5' (1.524 m), weight 110 lb 9.6 oz (50.2 kg), SpO2 98 %.  Wt Readings from Last 3 Encounters:  05/13/22 110 lb 9.6 oz (50.2 kg)  10/15/21 112 lb 6.4 oz (51 kg)  07/16/21 116 lb 4.8 oz (52.8 kg)    Body mass index is 21.6 kg/m.  Performance status (ECOG): 1 - Symptomatic but completely ambulatory  PHYSICAL EXAM:  Physical Exam Constitutional:      General: She is not in acute distress.    Appearance: Normal appearance. She is normal weight.  HENT:     Head: Normocephalic and atraumatic.  Eyes:     General: No scleral icterus.    Extraocular Movements: Extraocular movements intact.     Conjunctiva/sclera: Conjunctivae normal.     Pupils: Pupils are equal, round, and reactive to light.  Cardiovascular:     Rate and Rhythm: Normal rate and regular rhythm.     Pulses: Normal pulses.     Heart sounds: Normal heart sounds. No murmur heard.    No friction rub. No gallop.  Pulmonary:     Effort: Pulmonary effort is normal. No respiratory distress.     Breath sounds: Normal breath sounds.  Abdominal:     General: Bowel sounds are normal. There is no distension.     Palpations: Abdomen is soft. There is no hepatomegaly, splenomegaly or mass.     Tenderness: There is no abdominal tenderness.  Musculoskeletal:        General: Normal range of motion.     Cervical back: Normal range of motion and neck supple.     Right lower leg: No edema.     Left lower  leg: No edema.     Comments: Brace of the right leg with atrophy of both legs, left greater than right  Lymphadenopathy:     Cervical: No cervical adenopathy.  Skin:    General: Skin is warm and dry.     Comments: She has a non-healing wound adjacent to an old skin graft and there is yellow drainage.  Neurological:     General: No focal deficit present.     Mental Status: She is alert and oriented to person, place, and time. Mental status is at baseline.  Psychiatric:        Mood and Affect: Mood normal.        Behavior: Behavior normal.        Thought Content: Thought content normal.        Judgment: Judgment normal.    LABS:      Latest Ref Rng & Units 05/13/2022    2:36 PM 10/13/2021   12:00 AM 07/16/2021   12:00 AM  CBC  WBC 4.0 - 10.5 K/uL 8.1  9.0     6.5      Hemoglobin 12.0 - 15.0 g/dL 13.1  13.7     12.7      Hematocrit 36.0 - 46.0 % 40.8  41     39      Platelets 150 - 400 K/uL 233  210     188         This result is from an external source.      Latest Ref Rng & Units 05/13/2022    2:36 PM 10/13/2021   12:00 AM 07/16/2021   12:00 AM  CMP  Glucose 70 - 99 mg/dL 114     BUN 8 - 23 mg/dL 20  20     28      $ Creatinine 0.44 - 1.00 mg/dL 0.90  0.9     0.9      Sodium 135 - 145 mmol/L 140  139     141      Potassium 3.5 - 5.1 mmol/L 3.8  3.9     4.4      Chloride 98 - 111 mmol/L 107  104     107      CO2 22 - 32 mmol/L 25  29     25      $ Calcium 8.9 - 10.3 mg/dL 9.2  9.1     9.0      Total Protein 6.5 - 8.1 g/dL 6.8     Total Bilirubin 0.3 - 1.2 mg/dL 0.5     Alkaline Phos 38 - 126 U/L 67  66     63      AST 15 - 41 U/L 17  21     26      $ ALT 0 - 44 U/L 13  16     16         $ This result is from an external source.     Lab Results  Component Value Date   CEA1 5.3 (H) 10/01/2020   /  CEA  Date Value Ref Range Status  10/01/2020 5.3 (H) 0.0 - 4.7 ng/mL Final    Comment:    (NOTE)                             Nonsmokers          <3.9                              Smokers             <5.6 [employer] Electrochemiluminescence Immunoassay (ECLIA) Values obtained with different assay methods or kits cannot be used interchangeably.  Results cannot be interpreted as absolute evidence of the presence or absence of malignant disease. Performed At: [facility] [place], [place] [id number] Rush Farmer MD [id number]     Lab Results  Component Value Date   LDH 181 07/16/2021   LDH 175 01/14/2021   LDH 549 10/01/2020    STUDIES:  CT chest, abdomen and pelvis 10/13/2021  IMPRESSION: Soft tissue nodular densities in the lungs without significant interval change.  No mediastinal mass or significant lymphadenopathy. 2.  Left periaortic lymph node mass at the level of the left renal vein measures 2.5 x 2 cm, in comparison to 3.4 x 2.2 cm in the previous study.  No new  lymph nodes are seen. 3.  Stable multiple hepatic cystic lesions. 4.  Moderately severe diverticulosis of the sigmoid colon without diverticulitis.  Moderately large stool burden in the sigmoid colon and rectosigmoid. 5.  Stable T5 and T6 chronic vertebral fractures and stable multiple vertebral augmentations.  No new fracture is seen.   HISTORY:   Allergies:  Allergies  Allergen Reactions  . Other Other (See Comments)  . Clarithromycin Rash  . Penicillins Rash    Has patient had a PCN reaction causing immediate rash, facial/tongue/throat swelling, SOB or lightheadedness with hypotension: Unknown Has patient had a PCN reaction causing severe rash involving mucus membranes or skin necrosis: Unknown Has patient had a PCN reaction that required hospitalization: No Has patient had a PCN reaction occurring within the last 10 years: No If all of the above answers are "NO", then may proceed with Cephalosporin use.   . Sulfa Antibiotics Rash    Current Medications: Current Outpatient Medications  Medication Sig Dispense Refill  . atorvastatin  (LIPITOR) 40 MG tablet     . Calcium Carbonate-Vitamin D 600-400 MG-UNIT tablet Take 1 tablet by mouth 2 (two) times daily.     . ciprofloxacin (CIPRO) 250 MG tablet Take 1 tablet (250 mg total) by mouth 2 (two) times daily. 10 tablet 0  . levothyroxine (SYNTHROID) 88 MCG tablet Take 88 mcg by mouth every other day. Rotates it with 86mg every other day    . levothyroxine (SYNTHROID, LEVOTHROID) 75 MCG tablet Take 75 mcg by mouth every other day. Rotates every other day with 836m    . Multiple Vitamin (MULTIVITAMIN) capsule Take 1 capsule by mouth daily.    . nitrofurantoin, macrocrystal-monohydrate, (MACROBID) 100 MG capsule Take 1 capsule (100 mg total) by mouth 2 (two) times daily. 20 capsule 0  . omeprazole (PRILOSEC) 20 MG capsule Take 20 mg by mouth as needed (heartburn).    . PROLIA 60 MG/ML SOSY injection Inject 60 mg into the skin every 6 (six) months.    . sertraline (ZOLOFT) 100 MG tablet Take 100 mg by mouth daily.    . traMADol (ULTRAM) 50 MG tablet Take 50 mg by mouth daily.   0   No current facility-administered medications for this visit.      I,Gabriella Ballesteros,acting as a scribe for ChDerwood KaplanMD.,have documented all relevant documentation on the behalf of ChDerwood KaplanMD,as directed by  ChDerwood KaplanMD while in the presence of ChDerwood KaplanMD.   I have reviewed this report as typed by the medical scribe, and it is complete and accurate.

## 2022-05-13 ENCOUNTER — Other Ambulatory Visit: Payer: Self-pay | Admitting: Oncology

## 2022-05-13 ENCOUNTER — Encounter: Payer: Self-pay | Admitting: Oncology

## 2022-05-13 ENCOUNTER — Inpatient Hospital Stay: Payer: Medicare Other | Attending: Oncology | Admitting: Oncology

## 2022-05-13 ENCOUNTER — Inpatient Hospital Stay: Payer: Medicare Other

## 2022-05-13 VITALS — BP 129/93 | HR 83 | Temp 98.8°F | Resp 19 | Ht 60.0 in | Wt 110.6 lb

## 2022-05-13 DIAGNOSIS — R5383 Other fatigue: Secondary | ICD-10-CM | POA: Insufficient documentation

## 2022-05-13 DIAGNOSIS — C8213 Follicular lymphoma grade II, intra-abdominal lymph nodes: Secondary | ICD-10-CM | POA: Diagnosis not present

## 2022-05-13 DIAGNOSIS — Z8572 Personal history of non-Hodgkin lymphomas: Secondary | ICD-10-CM | POA: Diagnosis not present

## 2022-05-13 LAB — CBC WITH DIFFERENTIAL (CANCER CENTER ONLY)
Abs Immature Granulocytes: 0.04 10*3/uL (ref 0.00–0.07)
Basophils Absolute: 0 10*3/uL (ref 0.0–0.1)
Basophils Relative: 0 %
Eosinophils Absolute: 0 10*3/uL (ref 0.0–0.5)
Eosinophils Relative: 1 %
HCT: 40.8 % (ref 36.0–46.0)
Hemoglobin: 13.1 g/dL (ref 12.0–15.0)
Immature Granulocytes: 1 %
Lymphocytes Relative: 13 %
Lymphs Abs: 1 10*3/uL (ref 0.7–4.0)
MCH: 29.1 pg (ref 26.0–34.0)
MCHC: 32.1 g/dL (ref 30.0–36.0)
MCV: 90.7 fL (ref 80.0–100.0)
Monocytes Absolute: 0.6 10*3/uL (ref 0.1–1.0)
Monocytes Relative: 7 %
Neutro Abs: 6.4 10*3/uL (ref 1.7–7.7)
Neutrophils Relative %: 78 %
Platelet Count: 233 10*3/uL (ref 150–400)
RBC: 4.5 MIL/uL (ref 3.87–5.11)
RDW: 15.7 % — ABNORMAL HIGH (ref 11.5–15.5)
WBC Count: 8.1 10*3/uL (ref 4.0–10.5)
nRBC: 0 % (ref 0.0–0.2)

## 2022-05-13 LAB — CMP (CANCER CENTER ONLY)
ALT: 13 U/L (ref 0–44)
AST: 17 U/L (ref 15–41)
Albumin: 3.9 g/dL (ref 3.5–5.0)
Alkaline Phosphatase: 67 U/L (ref 38–126)
Anion gap: 8 (ref 5–15)
BUN: 20 mg/dL (ref 8–23)
CO2: 25 mmol/L (ref 22–32)
Calcium: 9.2 mg/dL (ref 8.9–10.3)
Chloride: 107 mmol/L (ref 98–111)
Creatinine: 0.9 mg/dL (ref 0.44–1.00)
GFR, Estimated: 60 mL/min — ABNORMAL LOW (ref >60)
Glucose, Bld: 114 mg/dL — ABNORMAL HIGH (ref 70–99)
Potassium: 3.8 mmol/L (ref 3.5–5.1)
Sodium: 140 mmol/L (ref 135–145)
Total Bilirubin: 0.5 mg/dL (ref 0.3–1.2)
Total Protein: 6.8 g/dL (ref 6.5–8.1)

## 2022-05-19 ENCOUNTER — Telehealth: Payer: Self-pay

## 2022-05-19 NOTE — Telephone Encounter (Signed)
Referral faxed to wound care center [facility] .

## 2022-05-25 ENCOUNTER — Ambulatory Visit: Payer: Medicare Other

## 2022-05-25 DIAGNOSIS — I452 Bifascicular block: Secondary | ICD-10-CM | POA: Diagnosis not present

## 2022-05-26 LAB — CUP PACEART REMOTE DEVICE CHECK
Battery Remaining Longevity: 60 mo
Battery Remaining Percentage: 52 %
Battery Voltage: 2.98 V
Brady Statistic AP VP Percent: 35 %
Brady Statistic AP VS Percent: 9.4 %
Brady Statistic AS VP Percent: 6.8 %
Brady Statistic AS VS Percent: 48 %
Brady Statistic RA Percent Paced: 43 %
Brady Statistic RV Percent Paced: 42 %
Date Time Interrogation Session: 20240227024757
Implantable Lead Connection Status: 753985
Implantable Lead Connection Status: 753985
Implantable Lead Implant Date: 20190205
Implantable Lead Implant Date: 20190205
Implantable Lead Location: 753859
Implantable Lead Location: 753860
Implantable Pulse Generator Implant Date: 20190205
Lead Channel Impedance Value: 350 Ohm
Lead Channel Impedance Value: 600 Ohm
Lead Channel Pacing Threshold Amplitude: 0.25 V
Lead Channel Pacing Threshold Amplitude: 0.625 V
Lead Channel Pacing Threshold Pulse Width: 0.5 ms
Lead Channel Pacing Threshold Pulse Width: 0.5 ms
Lead Channel Sensing Intrinsic Amplitude: 3.5 mV
Lead Channel Sensing Intrinsic Amplitude: 5.9 mV
Lead Channel Setting Pacing Amplitude: 0.875
Lead Channel Setting Pacing Amplitude: 2 V
Lead Channel Setting Pacing Pulse Width: 0.5 ms
Lead Channel Setting Sensing Sensitivity: 2 mV
Pulse Gen Model: 2272
Pulse Gen Serial Number: 8993229

## 2022-05-27 DIAGNOSIS — S91001A Unspecified open wound, right ankle, initial encounter: Secondary | ICD-10-CM | POA: Diagnosis not present

## 2022-05-28 DIAGNOSIS — L97519 Non-pressure chronic ulcer of other part of right foot with unspecified severity: Secondary | ICD-10-CM | POA: Diagnosis not present

## 2022-06-03 DIAGNOSIS — L97519 Non-pressure chronic ulcer of other part of right foot with unspecified severity: Secondary | ICD-10-CM | POA: Diagnosis not present

## 2022-06-03 DIAGNOSIS — L97511 Non-pressure chronic ulcer of other part of right foot limited to breakdown of skin: Secondary | ICD-10-CM | POA: Diagnosis not present

## 2022-06-09 DIAGNOSIS — H47011 Ischemic optic neuropathy, right eye: Secondary | ICD-10-CM | POA: Diagnosis not present

## 2022-06-10 DIAGNOSIS — S91001D Unspecified open wound, right ankle, subsequent encounter: Secondary | ICD-10-CM | POA: Diagnosis not present

## 2022-06-10 DIAGNOSIS — L97511 Non-pressure chronic ulcer of other part of right foot limited to breakdown of skin: Secondary | ICD-10-CM | POA: Diagnosis not present

## 2022-06-17 DIAGNOSIS — S91001D Unspecified open wound, right ankle, subsequent encounter: Secondary | ICD-10-CM | POA: Diagnosis not present

## 2022-06-17 DIAGNOSIS — L97511 Non-pressure chronic ulcer of other part of right foot limited to breakdown of skin: Secondary | ICD-10-CM | POA: Diagnosis not present

## 2022-06-30 DIAGNOSIS — S91001D Unspecified open wound, right ankle, subsequent encounter: Secondary | ICD-10-CM | POA: Diagnosis not present

## 2022-06-30 DIAGNOSIS — L97519 Non-pressure chronic ulcer of other part of right foot with unspecified severity: Secondary | ICD-10-CM | POA: Diagnosis not present

## 2022-06-30 NOTE — Progress Notes (Signed)
Remote pacemaker transmission.   

## 2022-07-01 DIAGNOSIS — M810 Age-related osteoporosis without current pathological fracture: Secondary | ICD-10-CM | POA: Diagnosis not present

## 2022-07-01 DIAGNOSIS — R350 Frequency of micturition: Secondary | ICD-10-CM | POA: Diagnosis not present

## 2022-07-01 DIAGNOSIS — K222 Esophageal obstruction: Secondary | ICD-10-CM | POA: Diagnosis not present

## 2022-07-01 DIAGNOSIS — D509 Iron deficiency anemia, unspecified: Secondary | ICD-10-CM | POA: Diagnosis not present

## 2022-07-01 DIAGNOSIS — E039 Hypothyroidism, unspecified: Secondary | ICD-10-CM | POA: Diagnosis not present

## 2022-07-01 DIAGNOSIS — E785 Hyperlipidemia, unspecified: Secondary | ICD-10-CM | POA: Diagnosis not present

## 2022-07-01 DIAGNOSIS — K219 Gastro-esophageal reflux disease without esophagitis: Secondary | ICD-10-CM | POA: Diagnosis not present

## 2022-07-01 DIAGNOSIS — M5137 Other intervertebral disc degeneration, lumbosacral region: Secondary | ICD-10-CM | POA: Diagnosis not present

## 2022-07-01 DIAGNOSIS — R110 Nausea: Secondary | ICD-10-CM | POA: Diagnosis not present

## 2022-07-07 DIAGNOSIS — L97519 Non-pressure chronic ulcer of other part of right foot with unspecified severity: Secondary | ICD-10-CM | POA: Diagnosis not present

## 2022-07-21 DIAGNOSIS — L97519 Non-pressure chronic ulcer of other part of right foot with unspecified severity: Secondary | ICD-10-CM | POA: Diagnosis not present

## 2022-07-26 ENCOUNTER — Telehealth: Payer: Self-pay

## 2022-07-26 NOTE — Telephone Encounter (Signed)
Pt LMOVM for nurse to give her a call back at [phone].

## 2022-07-26 NOTE — Telephone Encounter (Signed)
Patient reports feeling great. No changes in diet, health or medications, no missed doses.  Had a great outing with son yesterday and was excited about that.   Routing to Dr. Estill Dooms for consideration of NSVT V/S AVNRT.

## 2022-07-26 NOTE — Telephone Encounter (Signed)
Following alert received from [employer] received for Device alert for HVR Event occurred 4/28 @ 09:49, duration 11sec, HR 154 ? NSVT vs AVNRT - route to triage.  Attempted to contact patient to assess. Per family patient is asleep and unable to talk at this time. Requested patient call back when she is available. Direct dial left with family.

## 2022-07-28 MED ORDER — METOPROLOL SUCCINATE ER 25 MG PO TB24: 50.0000 mg | ORAL_TABLET | Freq: Every day | ORAL | 3 refills | Status: DC

## 2022-07-28 NOTE — Telephone Encounter (Signed)
Called patient to advise to START Toprol-XL 50 mg daily per Dr. Elberta Fortis. Script sent to pharmacy. Patient voiced understanding.

## 2022-08-04 ENCOUNTER — Other Ambulatory Visit: Payer: Self-pay | Admitting: Cardiology

## 2022-08-04 DIAGNOSIS — L97519 Non-pressure chronic ulcer of other part of right foot with unspecified severity: Secondary | ICD-10-CM | POA: Diagnosis not present

## 2022-08-11 ENCOUNTER — Telehealth: Payer: Self-pay

## 2022-08-11 NOTE — Telephone Encounter (Signed)
The patient would like for Dr. Elberta Fortis nurse to give her a call. She has questions about medications.

## 2022-08-12 MED ORDER — METOPROLOL SUCCINATE ER 50 MG PO TB24: 50.0000 mg | ORAL_TABLET | Freq: Every day | ORAL | 3 refills | Status: DC

## 2022-08-12 NOTE — Telephone Encounter (Signed)
Returned pt call. She reports she is out of Toprol already. Pt informed that a new Rx will be sent in and apologized that last Rx sent in had not sent in enough tablets. She also understands new Rx will be for 50 mg tablets and to pay attention to the mg on the Rx bottle. Patient verbalized understanding and agreeable to plan.

## 2022-08-18 DIAGNOSIS — L97519 Non-pressure chronic ulcer of other part of right foot with unspecified severity: Secondary | ICD-10-CM | POA: Diagnosis not present

## 2022-08-18 DIAGNOSIS — S91001A Unspecified open wound, right ankle, initial encounter: Secondary | ICD-10-CM | POA: Diagnosis not present

## 2022-08-24 ENCOUNTER — Ambulatory Visit (INDEPENDENT_AMBULATORY_CARE_PROVIDER_SITE_OTHER): Payer: Medicare Other

## 2022-08-24 DIAGNOSIS — R55 Syncope and collapse: Secondary | ICD-10-CM

## 2022-08-25 LAB — CUP PACEART REMOTE DEVICE CHECK
Battery Remaining Longevity: 56 mo
Battery Remaining Percentage: 49 %
Battery Voltage: 2.98 V
Brady Statistic AP VP Percent: 39 %
Brady Statistic AP VS Percent: 8.2 %
Brady Statistic AS VP Percent: 7.1 %
Brady Statistic AS VS Percent: 46 %
Brady Statistic RA Percent Paced: 46 %
Brady Statistic RV Percent Paced: 46 %
Date Time Interrogation Session: 20240528020013
Implantable Lead Connection Status: 753985
Implantable Lead Connection Status: 753985
Implantable Lead Implant Date: 20190205
Implantable Lead Implant Date: 20190205
Implantable Lead Location: 753859
Implantable Lead Location: 753860
Implantable Pulse Generator Implant Date: 20190205
Lead Channel Impedance Value: 340 Ohm
Lead Channel Impedance Value: 560 Ohm
Lead Channel Pacing Threshold Amplitude: 0.25 V
Lead Channel Pacing Threshold Amplitude: 0.625 V
Lead Channel Pacing Threshold Pulse Width: 0.5 ms
Lead Channel Pacing Threshold Pulse Width: 0.5 ms
Lead Channel Sensing Intrinsic Amplitude: 2.2 mV
Lead Channel Sensing Intrinsic Amplitude: 8.2 mV
Lead Channel Setting Pacing Amplitude: 0.875
Lead Channel Setting Pacing Amplitude: 2 V
Lead Channel Setting Pacing Pulse Width: 0.5 ms
Lead Channel Setting Sensing Sensitivity: 2 mV
Pulse Gen Model: 2272
Pulse Gen Serial Number: 8993229

## 2022-08-30 ENCOUNTER — Ambulatory Visit: Payer: Medicare Other | Attending: Cardiology | Admitting: Cardiology

## 2022-08-30 ENCOUNTER — Encounter: Payer: Self-pay | Admitting: Cardiology

## 2022-08-30 VITALS — BP 130/82 | HR 63 | Ht 60.0 in | Wt 114.2 lb

## 2022-08-30 DIAGNOSIS — L97519 Non-pressure chronic ulcer of other part of right foot with unspecified severity: Secondary | ICD-10-CM | POA: Diagnosis not present

## 2022-08-30 DIAGNOSIS — R55 Syncope and collapse: Secondary | ICD-10-CM | POA: Diagnosis not present

## 2022-08-30 DIAGNOSIS — I452 Bifascicular block: Secondary | ICD-10-CM

## 2022-08-30 DIAGNOSIS — I10 Essential (primary) hypertension: Secondary | ICD-10-CM

## 2022-08-30 LAB — CUP PACEART INCLINIC DEVICE CHECK
Battery Remaining Longevity: 56 mo
Battery Voltage: 2.98 V
Brady Statistic RA Percent Paced: 46 %
Brady Statistic RV Percent Paced: 47 %
Date Time Interrogation Session: 20240603161630
Implantable Lead Connection Status: 753985
Implantable Lead Connection Status: 753985
Implantable Lead Implant Date: 20190205
Implantable Lead Implant Date: 20190205
Implantable Lead Location: 753859
Implantable Lead Location: 753860
Implantable Pulse Generator Implant Date: 20190205
Lead Channel Impedance Value: 375 Ohm
Lead Channel Impedance Value: 562.5 Ohm
Lead Channel Pacing Threshold Amplitude: 0.5 V
Lead Channel Pacing Threshold Amplitude: 0.5 V
Lead Channel Pacing Threshold Amplitude: 0.75 V
Lead Channel Pacing Threshold Amplitude: 0.75 V
Lead Channel Pacing Threshold Pulse Width: 0.5 ms
Lead Channel Pacing Threshold Pulse Width: 0.5 ms
Lead Channel Pacing Threshold Pulse Width: 0.5 ms
Lead Channel Pacing Threshold Pulse Width: 0.5 ms
Lead Channel Sensing Intrinsic Amplitude: 2.6 mV
Lead Channel Sensing Intrinsic Amplitude: 8.2 mV
Lead Channel Setting Pacing Amplitude: 0.875
Lead Channel Setting Pacing Amplitude: 2 V
Lead Channel Setting Pacing Pulse Width: 0.5 ms
Lead Channel Setting Sensing Sensitivity: 2 mV
Pulse Gen Model: 2272
Pulse Gen Serial Number: 8993229

## 2022-08-30 NOTE — Patient Instructions (Signed)
Medication Instructions:  Your physician recommends that you continue on your current medications as directed. Please refer to the Current Medication list given to you today. *If you need a refill on your cardiac medications before your next appointment, please call your pharmacy*   Follow-Up: At [facility], you and your health needs are our priority.  As part of our continuing mission to provide you with exceptional heart care, we have created designated Provider Care Teams.  These Care Teams include your primary Cardiologist (physician) and Advanced Practice Providers (APPs -  Physician Assistants and Nurse Practitioners) who all work together to provide you with the care you need, when you need it.    Your next appointment:   1 year(s)  Provider:   Will Camnitz, MD  
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
## 2022-09-01 ENCOUNTER — Telehealth: Payer: Self-pay | Admitting: Cardiology

## 2022-09-01 DIAGNOSIS — L97519 Non-pressure chronic ulcer of other part of right foot with unspecified severity: Secondary | ICD-10-CM | POA: Diagnosis not present

## 2022-09-01 DIAGNOSIS — S91001A Unspecified open wound, right ankle, initial encounter: Secondary | ICD-10-CM | POA: Diagnosis not present

## 2022-09-01 NOTE — Telephone Encounter (Signed)
  Pt c/o medication issue:  1. Name of Medication:   metoprolol succinate (TOPROL-XL) 50 MG 24 hr tablet    2. How are you currently taking this medication (dosage and times per day)? As written  3. Are you having a reaction (difficulty breathing--STAT)? No   4. What is your medication issue? Pt would like to know if Dr. Elberta Fortis increased her dose or she need to take it on a different time

## 2022-09-01 NOTE — Telephone Encounter (Signed)
Attempted phone call to pt and left voicemail message to contact triage at [phone]. 

## 2022-09-02 ENCOUNTER — Telehealth: Payer: Self-pay | Admitting: Cardiology

## 2022-09-02 NOTE — Telephone Encounter (Signed)
Spoke with pt who reports she found her AVS from last OV 06/03 with Dr Elberta Fortis and understands he did not make a change to her Metoprolol.  Confirmed with pt no medication changes were made at her last appointment.  Pt thanked Charity fundraiser for the call.

## 2022-09-02 NOTE — Telephone Encounter (Signed)
This has been addressed in previous encounter.  Please see that encounter for complete details.

## 2022-09-02 NOTE — Telephone Encounter (Signed)
     Patient is returning a call from yesterday, concerning her medicine.

## 2022-09-15 DIAGNOSIS — L97519 Non-pressure chronic ulcer of other part of right foot with unspecified severity: Secondary | ICD-10-CM | POA: Diagnosis not present

## 2022-09-16 DIAGNOSIS — J208 Acute bronchitis due to other specified organisms: Secondary | ICD-10-CM | POA: Diagnosis not present

## 2022-09-16 DIAGNOSIS — R600 Localized edema: Secondary | ICD-10-CM | POA: Diagnosis not present

## 2022-09-16 DIAGNOSIS — B9689 Other specified bacterial agents as the cause of diseases classified elsewhere: Secondary | ICD-10-CM | POA: Diagnosis not present

## 2022-09-16 NOTE — Progress Notes (Signed)
Remote pacemaker transmission.   

## 2022-09-21 ENCOUNTER — Other Ambulatory Visit: Payer: Self-pay | Admitting: Cardiology

## 2022-09-21 DIAGNOSIS — L97519 Non-pressure chronic ulcer of other part of right foot with unspecified severity: Secondary | ICD-10-CM | POA: Diagnosis not present

## 2022-09-21 DIAGNOSIS — S91001A Unspecified open wound, right ankle, initial encounter: Secondary | ICD-10-CM | POA: Diagnosis not present

## 2022-10-04 DIAGNOSIS — M810 Age-related osteoporosis without current pathological fracture: Secondary | ICD-10-CM | POA: Diagnosis not present

## 2022-10-04 DIAGNOSIS — K219 Gastro-esophageal reflux disease without esophagitis: Secondary | ICD-10-CM | POA: Diagnosis not present

## 2022-10-04 DIAGNOSIS — D509 Iron deficiency anemia, unspecified: Secondary | ICD-10-CM | POA: Diagnosis not present

## 2022-10-04 DIAGNOSIS — G5601 Carpal tunnel syndrome, right upper limb: Secondary | ICD-10-CM | POA: Diagnosis not present

## 2022-10-04 DIAGNOSIS — E039 Hypothyroidism, unspecified: Secondary | ICD-10-CM | POA: Diagnosis not present

## 2022-10-04 DIAGNOSIS — E785 Hyperlipidemia, unspecified: Secondary | ICD-10-CM | POA: Diagnosis not present

## 2022-10-04 DIAGNOSIS — K222 Esophageal obstruction: Secondary | ICD-10-CM | POA: Diagnosis not present

## 2022-10-06 DIAGNOSIS — G5601 Carpal tunnel syndrome, right upper limb: Secondary | ICD-10-CM | POA: Diagnosis not present

## 2022-10-12 DIAGNOSIS — S91001A Unspecified open wound, right ankle, initial encounter: Secondary | ICD-10-CM | POA: Diagnosis not present

## 2022-10-12 DIAGNOSIS — L97512 Non-pressure chronic ulcer of other part of right foot with fat layer exposed: Secondary | ICD-10-CM | POA: Diagnosis not present

## 2022-10-14 ENCOUNTER — Ambulatory Visit: Payer: Medicare Other | Admitting: Oncology

## 2022-10-21 DIAGNOSIS — L97512 Non-pressure chronic ulcer of other part of right foot with fat layer exposed: Secondary | ICD-10-CM | POA: Diagnosis not present

## 2022-10-21 DIAGNOSIS — L97519 Non-pressure chronic ulcer of other part of right foot with unspecified severity: Secondary | ICD-10-CM | POA: Diagnosis not present

## 2022-10-26 ENCOUNTER — Telehealth: Payer: Self-pay | Admitting: Oncology

## 2022-10-26 NOTE — Telephone Encounter (Signed)
CT C/A/P has been scheduled for 11/09/22; Checking in @ 1   Notified pt of date,time and instructions.

## 2022-10-28 DIAGNOSIS — S91301D Unspecified open wound, right foot, subsequent encounter: Secondary | ICD-10-CM | POA: Diagnosis not present

## 2022-11-02 ENCOUNTER — Encounter: Payer: Self-pay | Admitting: Plastic Surgery

## 2022-11-02 ENCOUNTER — Ambulatory Visit: Payer: Medicare Other | Admitting: Plastic Surgery

## 2022-11-02 VITALS — BP 133/80 | HR 105 | Ht 60.0 in | Wt 113.2 lb

## 2022-11-02 DIAGNOSIS — C8213 Follicular lymphoma grade II, intra-abdominal lymph nodes: Secondary | ICD-10-CM | POA: Diagnosis not present

## 2022-11-02 DIAGNOSIS — S81801A Unspecified open wound, right lower leg, initial encounter: Secondary | ICD-10-CM | POA: Diagnosis not present
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
## 2022-11-03 ENCOUNTER — Observation Stay (HOSPITAL_COMMUNITY): Payer: Medicare Other

## 2022-11-03 ENCOUNTER — Encounter (HOSPITAL_COMMUNITY): Payer: Self-pay

## 2022-11-03 ENCOUNTER — Emergency Department (HOSPITAL_COMMUNITY): Payer: Medicare Other

## 2022-11-03 ENCOUNTER — Inpatient Hospital Stay (HOSPITAL_COMMUNITY)
Admission: EM | Admit: 2022-11-03 | Discharge: 2022-11-05 | DRG: 603 | Disposition: A | Discharge status: Home Health | Payer: Medicare Other | Attending: Internal Medicine | Admitting: Internal Medicine

## 2022-11-03 ENCOUNTER — Other Ambulatory Visit: Payer: Self-pay

## 2022-11-03 DIAGNOSIS — L03115 Cellulitis of right lower limb: Principal | ICD-10-CM | POA: Diagnosis present

## 2022-11-03 DIAGNOSIS — L03116 Cellulitis of left lower limb: Secondary | ICD-10-CM

## 2022-11-03 DIAGNOSIS — Z882 Allergy status to sulfonamides status: Secondary | ICD-10-CM | POA: Diagnosis not present

## 2022-11-03 DIAGNOSIS — Z95810 Presence of automatic (implantable) cardiac defibrillator: Secondary | ICD-10-CM

## 2022-11-03 DIAGNOSIS — M25571 Pain in right ankle and joints of right foot: Secondary | ICD-10-CM | POA: Diagnosis not present

## 2022-11-03 DIAGNOSIS — L0889 Other specified local infections of the skin and subcutaneous tissue: Secondary | ICD-10-CM | POA: Diagnosis present

## 2022-11-03 DIAGNOSIS — Z79899 Other long term (current) drug therapy: Secondary | ICD-10-CM | POA: Diagnosis not present

## 2022-11-03 DIAGNOSIS — Z880 Allergy status to penicillin: Secondary | ICD-10-CM | POA: Diagnosis not present

## 2022-11-03 DIAGNOSIS — Z79890 Hormone replacement therapy (postmenopausal): Secondary | ICD-10-CM

## 2022-11-03 DIAGNOSIS — Z881 Allergy status to other antibiotic agents status: Secondary | ICD-10-CM | POA: Diagnosis not present

## 2022-11-03 DIAGNOSIS — M868X6 Other osteomyelitis, lower leg: Secondary | ICD-10-CM | POA: Diagnosis not present

## 2022-11-03 DIAGNOSIS — I4510 Unspecified right bundle-branch block: Secondary | ICD-10-CM | POA: Diagnosis not present

## 2022-11-03 DIAGNOSIS — L97319 Non-pressure chronic ulcer of right ankle with unspecified severity: Secondary | ICD-10-CM | POA: Diagnosis present

## 2022-11-03 DIAGNOSIS — M869 Osteomyelitis, unspecified: Secondary | ICD-10-CM

## 2022-11-03 DIAGNOSIS — S81801A Unspecified open wound, right lower leg, initial encounter: Secondary | ICD-10-CM

## 2022-11-03 DIAGNOSIS — Z833 Family history of diabetes mellitus: Secondary | ICD-10-CM | POA: Diagnosis not present

## 2022-11-03 DIAGNOSIS — K219 Gastro-esophageal reflux disease without esophagitis: Secondary | ICD-10-CM | POA: Diagnosis not present

## 2022-11-03 DIAGNOSIS — Z66 Do not resuscitate: Secondary | ICD-10-CM | POA: Diagnosis present

## 2022-11-03 DIAGNOSIS — S91001A Unspecified open wound, right ankle, initial encounter: Secondary | ICD-10-CM | POA: Diagnosis present

## 2022-11-03 DIAGNOSIS — Z792 Long term (current) use of antibiotics: Secondary | ICD-10-CM

## 2022-11-03 DIAGNOSIS — S81801D Unspecified open wound, right lower leg, subsequent encounter: Secondary | ICD-10-CM | POA: Diagnosis not present

## 2022-11-03 DIAGNOSIS — M21371 Foot drop, right foot: Secondary | ICD-10-CM | POA: Diagnosis present

## 2022-11-03 DIAGNOSIS — M1811 Unilateral primary osteoarthritis of first carpometacarpal joint, right hand: Secondary | ICD-10-CM | POA: Diagnosis present

## 2022-11-03 DIAGNOSIS — E039 Hypothyroidism, unspecified: Secondary | ICD-10-CM | POA: Diagnosis present

## 2022-11-03 DIAGNOSIS — L0390 Cellulitis, unspecified: Secondary | ICD-10-CM | POA: Diagnosis present

## 2022-11-03 DIAGNOSIS — G14 Postpolio syndrome: Secondary | ICD-10-CM | POA: Diagnosis present

## 2022-11-03 DIAGNOSIS — E785 Hyperlipidemia, unspecified: Secondary | ICD-10-CM | POA: Diagnosis present

## 2022-11-03 DIAGNOSIS — C8210 Follicular lymphoma grade II, unspecified site: Secondary | ICD-10-CM | POA: Diagnosis present

## 2022-11-03 DIAGNOSIS — E079 Disorder of thyroid, unspecified: Secondary | ICD-10-CM | POA: Diagnosis present

## 2022-11-03 DIAGNOSIS — M85871 Other specified disorders of bone density and structure, right ankle and foot: Secondary | ICD-10-CM | POA: Diagnosis not present

## 2022-11-03 DIAGNOSIS — M7989 Other specified soft tissue disorders: Secondary | ICD-10-CM | POA: Diagnosis not present

## 2022-11-03 DIAGNOSIS — M79671 Pain in right foot: Secondary | ICD-10-CM | POA: Diagnosis not present

## 2022-11-03 DIAGNOSIS — L089 Local infection of the skin and subcutaneous tissue, unspecified: Principal | ICD-10-CM

## 2022-11-03 DIAGNOSIS — M1711 Unilateral primary osteoarthritis, right knee: Secondary | ICD-10-CM | POA: Diagnosis not present

## 2022-11-03 DIAGNOSIS — M85861 Other specified disorders of bone density and structure, right lower leg: Secondary | ICD-10-CM | POA: Diagnosis not present

## 2022-11-03 DIAGNOSIS — M86261 Subacute osteomyelitis, right tibia and fibula: Secondary | ICD-10-CM | POA: Diagnosis not present

## 2022-11-03 DIAGNOSIS — F329 Major depressive disorder, single episode, unspecified: Secondary | ICD-10-CM | POA: Diagnosis present

## 2022-11-03 LAB — CBC WITH DIFFERENTIAL/PLATELET
Abs Immature Granulocytes: 0.04 10*3/uL (ref 0.00–0.07)
Basophils Absolute: 0 10*3/uL (ref 0.0–0.1)
Basophils Relative: 0 %
Eosinophils Absolute: 0 10*3/uL (ref 0.0–0.5)
Eosinophils Relative: 0 %
HCT: 34.9 % — ABNORMAL LOW (ref 36.0–46.0)
Hemoglobin: 11.3 g/dL — ABNORMAL LOW (ref 12.0–15.0)
Immature Granulocytes: 1 %
Lymphocytes Relative: 12 %
Lymphs Abs: 0.7 10*3/uL (ref 0.7–4.0)
MCH: 29.6 pg (ref 26.0–34.0)
MCHC: 32.4 g/dL (ref 30.0–36.0)
MCV: 91.4 fL (ref 80.0–100.0)
Monocytes Absolute: 0.4 10*3/uL (ref 0.1–1.0)
Monocytes Relative: 7 %
Neutro Abs: 4.9 10*3/uL (ref 1.7–7.7)
Neutrophils Relative %: 80 %
Platelets: 165 10*3/uL (ref 150–400)
RBC: 3.82 MIL/uL — ABNORMAL LOW (ref 3.87–5.11)
RDW: 16 % — ABNORMAL HIGH (ref 11.5–15.5)
WBC: 6.2 10*3/uL (ref 4.0–10.5)
nRBC: 0 % (ref 0.0–0.2)

## 2022-11-03 LAB — COMPREHENSIVE METABOLIC PANEL
ALT: 16 U/L (ref 0–44)
AST: 19 U/L (ref 15–41)
Albumin: 3.5 g/dL (ref 3.5–5.0)
Alkaline Phosphatase: 65 U/L (ref 38–126)
Anion gap: 13 (ref 5–15)
BUN: 16 mg/dL (ref 8–23)
CO2: 22 mmol/L (ref 22–32)
Calcium: 8.9 mg/dL (ref 8.9–10.3)
Chloride: 103 mmol/L (ref 98–111)
Creatinine, Ser: 0.86 mg/dL (ref 0.44–1.00)
GFR, Estimated: >60 mL/min (ref >60)
Glucose, Bld: 109 mg/dL — ABNORMAL HIGH (ref 70–99)
Potassium: 3.7 mmol/L (ref 3.5–5.1)
Sodium: 138 mmol/L (ref 135–145)
Total Bilirubin: 0.3 mg/dL (ref 0.3–1.2)
Total Protein: 6.1 g/dL — ABNORMAL LOW (ref 6.5–8.1)

## 2022-11-03 LAB — C-REACTIVE PROTEIN
CRP: 2.1 mg/dL — ABNORMAL HIGH (ref <1.0)
CRP: 1.9 mg/dL — ABNORMAL HIGH (ref <1.0)

## 2022-11-03 LAB — SEDIMENTATION RATE: Sed Rate: 38 mm/hr — ABNORMAL HIGH (ref 0–22)

## 2022-11-03 MED ORDER — SODIUM CHLORIDE 0.9 % IV SOLN
1.0000 g | INTRAVENOUS | Status: DC
  Administered 2022-11-03: 1 g via INTRAVENOUS
  Filled 2022-11-03: qty 10

## 2022-11-03 MED ORDER — PANTOPRAZOLE SODIUM 40 MG PO TBEC
40.0000 mg | DELAYED_RELEASE_TABLET | Freq: Every day | ORAL | Status: DC
  Administered 2022-11-04 – 2022-11-05 (×2): 40 mg via ORAL
  Filled 2022-11-03 (×2): qty 1

## 2022-11-03 MED ORDER — TRAMADOL HCL 50 MG PO TABS
50.0000 mg | ORAL_TABLET | Freq: Every day | ORAL | Status: DC
  Administered 2022-11-04 – 2022-11-05 (×2): 50 mg via ORAL
  Filled 2022-11-03 (×2): qty 1

## 2022-11-03 MED ORDER — METOPROLOL SUCCINATE ER 50 MG PO TB24
50.0000 mg | ORAL_TABLET | Freq: Every day | ORAL | Status: DC
  Administered 2022-11-03 – 2022-11-05 (×3): 50 mg via ORAL
  Filled 2022-11-03 (×3): qty 1

## 2022-11-03 MED ORDER — LEVOTHYROXINE SODIUM 88 MCG PO TABS
88.0000 ug | ORAL_TABLET | Freq: Every day | ORAL | Status: DC
  Administered 2022-11-04 – 2022-11-05 (×2): 88 ug via ORAL
  Filled 2022-11-03 (×2): qty 1

## 2022-11-03 MED ORDER — ENOXAPARIN SODIUM 30 MG/0.3ML IJ SOSY
30.0000 mg | PREFILLED_SYRINGE | INTRAMUSCULAR | Status: DC
  Administered 2022-11-03 – 2022-11-04 (×2): 30 mg via SUBCUTANEOUS
  Filled 2022-11-03 (×2): qty 0.3

## 2022-11-03 MED ORDER — SERTRALINE HCL 100 MG PO TABS
100.0000 mg | ORAL_TABLET | Freq: Every day | ORAL | Status: DC
  Administered 2022-11-04 – 2022-11-05 (×2): 100 mg via ORAL
  Filled 2022-11-03 (×2): qty 1
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
## 2022-11-03 NOTE — ED Triage Notes (Signed)
Pt arrives via POV from home. Pt reports chronic wound to top of her right foot. Pt states since last Thursday she has had progressive redness and swelling around the wound.

## 2022-11-03 NOTE — H&P (Signed)
Date: 11/03/2022               Patient Name:  Julia Hunt MRN: [id number]  DOB: Nov 15, 1927 Age / Sex: 89 y.o., female   PCP: [name], MD         Medical Service: Internal Medicine Teaching Service         Attending Physician: Dr. Mercie Eon, MD      First Contact: Luiz Iron, MD    Second Contact: Dr. Modena Slater, DO Pager [phone]         After Hours (After 5p/  First Contact Pager: [phone]  weekends / holidays): Second Contact Pager: [phone]   SUBJECTIVE   Chief Complaint: pain and swelling in right leg  History of Present Illness:  Ms. Julia Hunt is a 89 year old with past medical history of polio with residual right lower extremity foot drop, RBBB s/p pacemaker, follicular lymphoma grad II presenting with worsening of swelling and pain in her chronic right lower extremity wound. Wound first appeared as small skin break on her ankle about a year ago.  She had surgery on this joint as a teenager and required a skin graft. She follows closely with wound care and has been treated with cephalexin, ciprofloxacin, clindamycin, doxycycline and levofloxacin. Last week she noted increased pain and redness that has progressing to her mid shin. She has noted some yellow/ green drainage from her wound in the last week.   Meds:  Levothyroxine 88 mcg Metoprolol succinate 50 mg Omeprazole 50 mg Sertraline 100 mg Tramadol 50 mg  Past Medical History  Past Surgical History:  Procedure Laterality Date   ABDOMINAL HYSTERECTOMY     APPENDECTOMY     CESAREAN SECTION     EYE SURGERY     ORTHOPEDIC SURGERY     OTHER SURGICAL HISTORY     sinus surgery   PACEMAKER IMPLANT N/A 05/03/2017   Procedure: PACEMAKER IMPLANT;  Surgeon: Regan Lemming, MD;  Location: [facility];  Service: Cardiovascular;  Laterality: N/A;   TONSILLECTOMY     TOTAL HIP ARTHROPLASTY      Social:  Lives With: her son Occupation: retired Designer, jewellery: her granddaugher helps  mostly, son lives with her but is not very helpful  Level of Function: iADLs PCP: Duke Salvia Substances: denies cigarettes use, alcohol use  Allergies: Allergies as of 11/03/2022 - Review Complete 11/03/2022  Allergen Reaction Noted   Other Other (See Comments) 05/13/2022   Clarithromycin Other (See Comments) 01/22/2015   Penicillins Other (See Comments) 09/12/2013   Sulfa antibiotics Other (See Comments) 01/22/2015    Review of Systems: A complete ROS was negative except as per HPI.   OBJECTIVE:   Physical Exam: Blood pressure 133/61, pulse 67, temperature 98.5 F (36.9 C), temperature source Oral, resp. rate 18, height 5' (1.524 m), weight 51.3 kg, SpO2 94%.  Constitutional: well-appearing, in no acute distress Cardiovascular: regular rate and rhythm, no m/r/g Pulmonary/Chest: normal work of breathing on room air, lungs clear to auscultation bilaterally Abdominal: soft, non-tender, non-distended MSK: warmth from mid-foot up to mid-shin on right lower extremity, small wound on lateral ankle with well healed scar, serosanguinous drainage present Neurological: alert & oriented x 3 Skin: warm and dry  Labs: CBC    Component Value Date/Time   WBC 6.2 11/03/2022 1036   RBC 3.82 (L) 11/03/2022 1036   HGB 11.3 (L) 11/03/2022 1036   HGB 13.1 05/13/2022 1436   HGB 12.2 04/27/2017 1647  HCT 34.9 (L) 11/03/2022 1036   HCT 38.8 04/27/2017 1647   PLT 165 11/03/2022 1036   PLT 233 05/13/2022 1436   PLT 230 04/27/2017 1647   MCV 91.4 11/03/2022 1036   MCV 88 06/27/2020 0000   MCH 29.6 11/03/2022 1036   MCHC 32.4 11/03/2022 1036   RDW 16.0 (H) 11/03/2022 1036   RDW 14.9 04/27/2017 1647   LYMPHSABS 0.7 11/03/2022 1036   LYMPHSABS 1.6 04/27/2017 1647   MONOABS 0.4 11/03/2022 1036   EOSABS 0.0 11/03/2022 1036   EOSABS 0.1 04/27/2017 1647   BASOSABS 0.0 11/03/2022 1036   BASOSABS 0.0 04/27/2017 1647     CMP     Component Value Date/Time   NA 138 11/03/2022 1036   NA 139  10/13/2021 0000   K 3.7 11/03/2022 1036   CL 103 11/03/2022 1036   CO2 22 11/03/2022 1036   GLUCOSE 109 (H) 11/03/2022 1036   BUN 16 11/03/2022 1036   BUN 20 10/13/2021 0000   CREATININE 0.86 11/03/2022 1036   CREATININE 0.90 05/13/2022 1436   CALCIUM 8.9 11/03/2022 1036   PROT 6.1 (L) 11/03/2022 1036   ALBUMIN 3.5 11/03/2022 1036   AST 19 11/03/2022 1036   AST 17 05/13/2022 1436   ALT 16 11/03/2022 1036   ALT 13 05/13/2022 1436   ALKPHOS 65 11/03/2022 1036   BILITOT 0.3 11/03/2022 1036   BILITOT 0.5 05/13/2022 1436   GFRNONAA >60 11/03/2022 1036   GFRNONAA 60 (L) 05/13/2022 1436   GFRAA 57 (L) 04/27/2017 1647    ASSESSMENT & PLAN:   Assessment & Plan by Problem: Principal Problem:   Cellulitis   Julia Hunt is a 89 y.o. person living with polio with residual right lower extremity foot drop, RBBB s/p pacemaker, follicular lymphoma grad II who presented with erythema, swelling and drainage from chronic right lower extremity wound and admitted for cellulitis on hospital day 0  Cellulitis Patient presenting with chronic wound of left lower extremity. Unable to view wound clinic notes but most recently treated with 10 day course of doxycyline. She has worsening of swelling and edema in last week. Her exam is consistent with cellulitis. She is afebrile with no leukocytosis. Lower extremity ultrasound 11/23 showed normal ABI. Ortho was consulted due to concern about possible osteomyelitis. I talked with her about this concern and she would like to get imaging to learn more but overall she is unsure if having an amputation would be within her goals of care. Her and her granddaughter had discussed this prior to coming to the hospital. -trend CBC -IV ceftriaxone -MRI foot/ tib fib -continue goals of care conversations  Follicular lymphoma grade II  This was diagnosed in 2022. She follows with Dr. Matthias Hughs with oncology. Currently monitoring disease progression with routine  imaging. She does not wish to receive chemotherapy as is concerned about if this would decrease her quality of life.  Chronic stable conditions: RBBB s/p pacemaker -metoprolol succinate 50 mg Hypothyroidism -synthroid 88 mcg Depression  -sertraline 100 mg GERD -pantoprazole 40 mg   Diet: Normal VTE: Enoxaparin Code: DNR/DNI  Prior to Admission Living Arrangement: Home, living son Anticipated Discharge Location: Home Barriers to Discharge: MRI  Dispo: Admit patient to Observation with expected length of stay less than 2 midnights.  Signed: Rudene Christians, DO Internal Medicine Resident PGY-3  11/03/2022, 5:43 PM

## 2022-11-03 NOTE — Progress Notes (Addendum)
ID consult received - waiting for MRI read and will see her in the AM formally.   History in chart noted with chronic anterior right ulcerated wound with cellulitis.  Hemodynamically stable - ceftriaxone is reasonable for empiric treatment for now until we get more information with MRI to gauge depth. Tissue cultures may be necessary to achieve microbiologic diagnosis given her poor response to multiple rounds of oral abx, though the only thing she has received this year has been doxycyline and could have missed more typical strep strains vs uncovered gram negative bacteria. Looks like she struggled with acute wound infection between June - August of 2023 and relapsed again in March and June of 2024 prior to today's presentation from Government social research officer consultation.    Amoxicillin (looks like prophylaxis) has been dispensed since 2021  **s/p dual-chamber pacemaker 2019.   Cephalexin  11/19/21 x 14d   06/27/2020 x 5d  Ciprofloxicin  10/16/2021 x 5d  Clindamycin  09/17/2021 x 10d   Doxycycline  06/13/20 x 10d   09/30/2021 x 7d   06/21/22 x 7d   09/15/22 x 10d  --> most recent tx    Levofloxacin 03/17/2022 x 10d   Rexene Alberts, MSN, NP-C [facility]  [email or web address] Pager: [phone] Office: [phone] [facility] Main Line: [phone] *Secure Chat Communication Welcome

## 2022-11-03 NOTE — Progress Notes (Signed)
Patient transferred from ED via bed at 1500PM. Alert and oriented
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
## 2022-11-03 NOTE — ED Notes (Signed)
Help get patent into a gown on the monitor patient is resting with call bell in reach and family at bedside

## 2022-11-04 ENCOUNTER — Observation Stay (HOSPITAL_COMMUNITY): Payer: Medicare Other

## 2022-11-04 ENCOUNTER — Observation Stay (HOSPITAL_BASED_OUTPATIENT_CLINIC_OR_DEPARTMENT_OTHER): Payer: Medicare Other

## 2022-11-04 DIAGNOSIS — L97909 Non-pressure chronic ulcer of unspecified part of unspecified lower leg with unspecified severity: Secondary | ICD-10-CM | POA: Diagnosis not present

## 2022-11-04 DIAGNOSIS — M868X6 Other osteomyelitis, lower leg: Secondary | ICD-10-CM | POA: Diagnosis present

## 2022-11-04 DIAGNOSIS — Z79890 Hormone replacement therapy (postmenopausal): Secondary | ICD-10-CM | POA: Diagnosis not present

## 2022-11-04 DIAGNOSIS — R600 Localized edema: Secondary | ICD-10-CM | POA: Diagnosis not present

## 2022-11-04 DIAGNOSIS — S81801D Unspecified open wound, right lower leg, subsequent encounter: Secondary | ICD-10-CM | POA: Diagnosis not present

## 2022-11-04 DIAGNOSIS — M869 Osteomyelitis, unspecified: Secondary | ICD-10-CM

## 2022-11-04 DIAGNOSIS — K219 Gastro-esophageal reflux disease without esophagitis: Secondary | ICD-10-CM | POA: Diagnosis present

## 2022-11-04 DIAGNOSIS — Z792 Long term (current) use of antibiotics: Secondary | ICD-10-CM | POA: Diagnosis not present

## 2022-11-04 DIAGNOSIS — Z79899 Other long term (current) drug therapy: Secondary | ICD-10-CM | POA: Diagnosis not present

## 2022-11-04 DIAGNOSIS — Z95810 Presence of automatic (implantable) cardiac defibrillator: Secondary | ICD-10-CM | POA: Diagnosis not present

## 2022-11-04 DIAGNOSIS — Z833 Family history of diabetes mellitus: Secondary | ICD-10-CM | POA: Diagnosis not present

## 2022-11-04 DIAGNOSIS — L97319 Non-pressure chronic ulcer of right ankle with unspecified severity: Secondary | ICD-10-CM | POA: Diagnosis present

## 2022-11-04 DIAGNOSIS — M7989 Other specified soft tissue disorders: Secondary | ICD-10-CM

## 2022-11-04 DIAGNOSIS — C8210 Follicular lymphoma grade II, unspecified site: Secondary | ICD-10-CM | POA: Diagnosis present

## 2022-11-04 DIAGNOSIS — F329 Major depressive disorder, single episode, unspecified: Secondary | ICD-10-CM | POA: Diagnosis present

## 2022-11-04 DIAGNOSIS — Z880 Allergy status to penicillin: Secondary | ICD-10-CM | POA: Diagnosis not present

## 2022-11-04 DIAGNOSIS — E039 Hypothyroidism, unspecified: Secondary | ICD-10-CM | POA: Diagnosis present

## 2022-11-04 DIAGNOSIS — L0889 Other specified local infections of the skin and subcutaneous tissue: Secondary | ICD-10-CM | POA: Diagnosis present

## 2022-11-04 DIAGNOSIS — M1711 Unilateral primary osteoarthritis, right knee: Secondary | ICD-10-CM | POA: Diagnosis not present

## 2022-11-04 DIAGNOSIS — E785 Hyperlipidemia, unspecified: Secondary | ICD-10-CM | POA: Diagnosis present

## 2022-11-04 DIAGNOSIS — Z881 Allergy status to other antibiotic agents status: Secondary | ICD-10-CM | POA: Diagnosis not present

## 2022-11-04 DIAGNOSIS — G14 Postpolio syndrome: Secondary | ICD-10-CM | POA: Diagnosis present

## 2022-11-04 DIAGNOSIS — Z66 Do not resuscitate: Secondary | ICD-10-CM | POA: Diagnosis present

## 2022-11-04 DIAGNOSIS — M86261 Subacute osteomyelitis, right tibia and fibula: Secondary | ICD-10-CM

## 2022-11-04 DIAGNOSIS — E079 Disorder of thyroid, unspecified: Secondary | ICD-10-CM | POA: Diagnosis present

## 2022-11-04 DIAGNOSIS — L03116 Cellulitis of left lower limb: Secondary | ICD-10-CM | POA: Diagnosis not present

## 2022-11-04 DIAGNOSIS — L03115 Cellulitis of right lower limb: Secondary | ICD-10-CM | POA: Diagnosis not present

## 2022-11-04 DIAGNOSIS — I4510 Unspecified right bundle-branch block: Secondary | ICD-10-CM | POA: Diagnosis present

## 2022-11-04 DIAGNOSIS — Z882 Allergy status to sulfonamides status: Secondary | ICD-10-CM | POA: Diagnosis not present

## 2022-11-04 DIAGNOSIS — S91001A Unspecified open wound, right ankle, initial encounter: Secondary | ICD-10-CM | POA: Diagnosis present

## 2022-11-04 DIAGNOSIS — M21371 Foot drop, right foot: Secondary | ICD-10-CM | POA: Diagnosis present

## 2022-11-04 DIAGNOSIS — M1811 Unilateral primary osteoarthritis of first carpometacarpal joint, right hand: Secondary | ICD-10-CM | POA: Diagnosis present

## 2022-11-04 MED ORDER — CEFAZOLIN SODIUM-DEXTROSE 2-4 GM/100ML-% IV SOLN
2.0000 g | Freq: Two times a day (BID) | INTRAVENOUS | Status: DC
  Administered 2022-11-04 (×2): 2 g via INTRAVENOUS
  Filled 2022-11-04 (×2): qty 100

## 2022-11-04 MED ORDER — SODIUM CHLORIDE 0.9 % IV SOLN: 2.0000 g | INTRAVENOUS | Status: DC

## 2022-11-04 MED ORDER — GADOBUTROL 1 MMOL/ML IV SOLN
5.0000 mL | Freq: Once | INTRAVENOUS | Status: AC | PRN
  Administered 2022-11-04: 5 mL via INTRAVENOUS

## 2022-11-04 MED ORDER — ACETAMINOPHEN 325 MG PO TABS
650.0000 mg | ORAL_TABLET | Freq: Four times a day (QID) | ORAL | Status: DC | PRN
  Administered 2022-11-05: 650 mg via ORAL
  Filled 2022-11-04: qty 2

## 2022-11-04 NOTE — Progress Notes (Signed)
Orthopaedic Trauma Service (OTS)      Subjective: Patient reports pain and erythema has improved.    Objective: Current Vitals Blood pressure 130/76, pulse 67, temperature 97.9 F (36.6 C), temperature source Oral, resp. rate 18, height 5' (1.524 m), weight 51.3 kg, SpO2 94%. Vital signs in last 24 hours: Temp:  [97.8 F (36.6 C)-98.6 F (37 C)] 97.9 F (36.6 C) (08/08 0728) Pulse Rate:  [64-78] 67 (08/08 0728) Resp:  [16-18] 18 (08/08 0728) BP: (111-130)/(71-91) 130/76 (08/08 0728) SpO2:  [93 %-97 %] 94 % (08/08 0728)  Intake/Output from previous day: No intake/output data recorded.  LABS Recent Labs    11/03/22 1036 11/04/22 0052  HGB 11.3* 9.8*   Recent Labs    11/03/22 1036 11/04/22 0052  WBC 6.2 6.3  RBC 3.82* 3.39*  HCT 34.9* 30.3*  PLT 165 173   Recent Labs    11/03/22 1036 11/04/22 0052  NA 138 143  K 3.7 3.9  CL 103 107  CO2 22 24  BUN 16 17  CREATININE 0.86 0.75  GLUCOSE 109* 95  CALCIUM 8.9 8.6*   MRI: consistent with osteo; no clear Brodie's abscess that would require surgical debridement but still somewhat difficult to tell  CRP 1.9 ESR 38   Physical Exam RLE  Dressing intact, mild drainage  Edema/ swelling moderate; erythema improved  Sens: reduced  Motor: minimal baseline polio   Brisk cap refill, warm to touch; DP palp  Assessment/Plan: Right tibia and talus osteomyelitis with draining sinus and assoc cellulitis--good response to rx Most reasonable to try treatment for cure with IV Abx as an initial approach; if recurs then options would include surgical debridement with Plastics closure vs amputation vs long term suppression.  Appreciate ID service and defer medical management to them. Please contact me if I may be of any further help at this time.  Myrene Galas, MD [facility], [facility] [phone]

## 2022-11-04 NOTE — Progress Notes (Signed)
Subjective: Julia Hunt is a 89 year old female with a past medical history of polio complicated by right lower extremity wasting/foot drop, a 1 year history of anterior RLE chronic wound treated by wound care with multiple failed p.o. antibiotic trials (cephalexin, Cipro, Doxy, clinda) who now presents to [facility] with 1 week RLE cellulitis +/- osteo picture.  No acute events overnight.  No medication refusals.  On attempted interview morning, patient was receiving MRI.  On reinterview in afternoon, patient is alert and attentive.  Per patient, notes concerning swelling and burning sensation similar to yesterday.  Asked for Tylenol versus ibuprofen, otherwise no new fever, chills, chest pain, shortness of breath.  Wanted to know results of MRI.  Discussed with patient likely finding of osteomyelitis on MRI read, and reviewed options of treatment including: long-term IV antibiotics versus right BKA.  Patient adamantly refuses BKA and favors long-term IV antibiotic use.  Patient said that she is too far along in years to undergo surgery.  Uses right leg for mobility, cannot complete ADLs and IADLs noted malfunctioning hearing aid.  Objective:  Vitals unremarkable overnight.  Pertinent Labs: Hemoglobin: 9.8 (11.3), CRP: 1.9 (2.1), SED: 38.  Pertinent imaging (emphasis mine):  MR Foot Right with/without contrast:  IMPRESSION: 1. New/increased subcortical marrow edema and enhancement anteriorly in the distal tibia and talus compared with the previous MRI. These findings are suspicious for osteomyelitis given adjacent soft tissue edema and enhancement, further described on separate examination of the lower leg. 2. No evidence of osteomyelitis or septic arthritis within the foot. 3. Remote ankle fusion with solid ankylosis. Stable tibiotalar and subtalar degenerative changes. 4. Right lower leg findings dictated separately.  MR tibia-fibula right with/without  contrast:  IMPRESSION: 1. Apparent skin ulceration anteriorly in the distal lower leg with underlying heterogeneous enhancement consistent with soft tissue infection. No focal fluid collection identified. 2. Cortical erosion along the anterior aspect of the distal tibia and adjacent talus with low level marrow edema and enhancement, suspicious for osteomyelitis. 3. No acute osseous findings in the proximal lower leg. 4. Severe fatty atrophy of the right lower leg musculature attributed to polio. 5. Remote tibiotalar arthrodesis with solid ankylosis between the distal tibia, distal fibula and the talus. 6. Right foot findings dictated separately.  Vital signs in last 24 hours: Vitals:   11/03/22 1800 11/03/22 2009 11/04/22 0454 11/04/22 0728  BP:  111/71 (!) 129/91 130/76  Pulse: 78 68 64 67  Resp:  16 18 18   Temp:  98.6 F (37 C) 97.8 F (36.6 C) 97.9 F (36.6 C)  TempSrc:  Oral Oral Oral  SpO2:  96% 93% 94%  Weight:      Height:       Constitutional: Small appearing woman, no apparent distress Cardiovascular: Regular rate and rhythm Respiratory: Clear to auscultation bilaterally Abdominal: Appears soft MSK: 2 x 3 cm right leg wound, with warmth and erythema up to mid shin, bogginess noted on posterior shin.  No gross discharge at this time. Neuro: No gross deficits Psych: Appropriate mood and affect, pleasant  Assessment/Plan:  Principal Problem:   Cellulitis  This is a 89 year old female with a past medical history of polio with right leg drop complicated by 1 year history of chronic leg wound and 1 week history of cellulitis now with MRI findings suspicious for osteomyelitis.   #Cellulitis: #Likely osteomyelitis: #Goals of CARE discussion:  Vitals within normal limits.  Patient appears at baseline.  MRI strongly suggests osteomyelitis picture.  Infectious  disease and orthopedic surgery following.  Ordered Tylenol 650 mg every 6 hours as needed for mild pain.   Primary team empirically increased IV ceftriaxone dosing earlier in day from 1 g to 2 g, ID later changed this to IV Ancef 2 g every 12 hours in the setting of likely osteomyelitis.  This finding is supported by heightened inflammatory labs: Sed rate: 38, CRP, 1.9.  WBCs: 6.3 (6.2).  Repeat CBC scheduled for tomorrow morning.  BMP unremarkable.  Concerning patient's osteomyelitis, her personal preference is long-term IV antibiotics over amputation.  Patient will need a PICC line before discharge to outpatient setting.  Awaiting ID recommendations, which are appreciated.  Patient will likely stay overnight as we sort out particulars.  #Stable issues: Follicular lymphoma grade 2: Monitoring with recent imaging.  Receiving no active therapy at this time. Right bundle branch block status post ICD: Continue metoprolol 50 mg Hypothyroidism: Continue Synthroid 88 mcg Major depressive disorder: Continue sertraline 100 mg GERD: Continue pantoprazole 40 mg  Prior to Admission Living Arrangement: Home, living with son Anticipated Discharge Location: Home Barriers to Discharge: Treatment planning, Dispo: Anticipated discharge in approximately 1-2 day(s) as we determine antibiotic course.   Tomie China, MD 11/04/2022, 1:31 PM After 5pm on weekdays and 1pm on weekends: On Call pager [phone]

## 2022-11-04 NOTE — Progress Notes (Signed)
VASCULAR LAB    Right lower extremity venous duplex has been performed.  See CV proc for preliminary results.   , , RVT 11/04/2022, 9:39 AM

## 2022-11-04 NOTE — Plan of Care (Signed)
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
## 2022-11-05 ENCOUNTER — Other Ambulatory Visit: Payer: Self-pay

## 2022-11-05 ENCOUNTER — Inpatient Hospital Stay (HOSPITAL_COMMUNITY): Payer: Medicare Other

## 2022-11-05 ENCOUNTER — Other Ambulatory Visit (HOSPITAL_COMMUNITY): Payer: Self-pay

## 2022-11-05 DIAGNOSIS — L97909 Non-pressure chronic ulcer of unspecified part of unspecified lower leg with unspecified severity: Secondary | ICD-10-CM | POA: Diagnosis not present

## 2022-11-05 DIAGNOSIS — L03116 Cellulitis of left lower limb: Secondary | ICD-10-CM | POA: Diagnosis not present

## 2022-11-05 LAB — AEROBIC CULTURE W GRAM STAIN (SUPERFICIAL SPECIMEN): Gram Stain: NONE SEEN

## 2022-11-05 MED ORDER — CHLORHEXIDINE GLUCONATE CLOTH 2 % EX PADS: 6.0000 | MEDICATED_PAD | Freq: Every day | CUTANEOUS | Status: DC

## 2022-11-05 MED ORDER — SODIUM CHLORIDE 0.9 % IV SOLN
2.0000 g | INTRAVENOUS | Status: DC
  Administered 2022-11-05: 2 g via INTRAVENOUS
  Filled 2022-11-05: qty 20

## 2022-11-05 MED ORDER — SODIUM CHLORIDE 0.9% FLUSH: 10.0000 mL | Freq: Two times a day (BID) | INTRAVENOUS | Status: DC

## 2022-11-05 MED ORDER — CEFTRIAXONE IV (FOR PTA / DISCHARGE USE ONLY): 2.0000 g | INTRAVENOUS | 0 refills | Status: AC

## 2022-11-05 MED ORDER — CEFTRIAXONE IV (FOR PTA / DISCHARGE USE ONLY): 2.0000 g | INTRAVENOUS | 0 refills | Status: DC

## 2022-11-05 MED ORDER — CEFAZOLIN SODIUM-DEXTROSE 2-4 GM/100ML-% IV SOLN: 2.0000 g | Freq: Three times a day (TID) | INTRAVENOUS | Status: DC

## 2022-11-05 MED ORDER — MENTHOL 3 MG MT LOZG
1.0000 | LOZENGE | OROMUCOSAL | Status: DC | PRN
  Filled 2022-11-05: qty 9

## 2022-11-05 MED ORDER — POLYETHYLENE GLYCOL 3350 17 G PO PACK
17.0000 g | PACK | Freq: Every day | ORAL | Status: DC
  Filled 2022-11-05: qty 1

## 2022-11-05 MED ORDER — SODIUM CHLORIDE 0.9% FLUSH: 10.0000 mL | INTRAVENOUS | Status: DC | PRN

## 2022-11-05 MED ORDER — MENTHOL 3 MG MT LOZG
1.0000 | LOZENGE | OROMUCOSAL | 12 refills | Status: AC | PRN
Start: 2022-11-05 — End: 2022-12-05
  Filled 2022-11-05: qty 100, fill #0

## 2022-11-05 NOTE — Discharge Instructions (Addendum)
Julia Hunt It was a pleasure taking care of you at [facility]. You were admitted for cellulitis and treated for both cellulitis and osteomyelitis. We are discharging you home now that you are doing better. Please follow the following instructions:  1) Continue intravenous Ceftriaxone 2g every day for two weeks (8/9-8/23) through your PICC line. Afterwards, take cefadroxil 500 mg by mouth twice daily as recommended by the infectious disease team.  2) Follow up with your infectious disease outpatient appointment on 11/17/2022 at 11:15 a.m. with Blanchard Kelch, NP and also 12/14/2022 at 3:00 p.m. with Judyann Munson, MD. 3) Be aware of any changes in your wound's status. If you notice any new redness, swelling, fever, chills, pain or discharge, please present to the emergency department.  4) Please continue to take all of your other medications as prescribed.   Take care,  Dr. Tomie China, MD

## 2022-11-05 NOTE — Progress Notes (Signed)
Peripherally Inserted Central Catheter Placement  The IV Nurse has discussed with the patient and/or persons authorized to consent for the patient, the purpose of this procedure and the potential benefits and risks involved with this procedure.  The benefits include less needle sticks, lab draws from the catheter, and the patient may be discharged home with the catheter. Risks include, but not limited to, infection, bleeding, blood clot (thrombus formation), and puncture of an artery; nerve damage and irregular heartbeat and possibility to perform a PICC exchange if needed/ordered by physician.  Alternatives to this procedure were also discussed.  Bard Power PICC patient education guide, fact sheet on infection prevention and patient information card has been provided to patient /or left at bedside.    PICC Placement Documentation  PICC Single Lumen 11/05/22 Right Basilic 36 cm 0 cm (Active)  Indication for Insertion or Continuance of Line Home intravenous therapies (PICC only) 11/05/22 1300  Exposed Catheter (cm) 0 cm 11/05/22 1300  Site Assessment Clean, Dry, Intact 11/05/22 1300  Line Status Flushed;Saline locked;Blood return noted 11/05/22 1300  Dressing Type Transparent;Securing device 11/05/22 1300  Dressing Status Antimicrobial disc in place;Clean, Dry, Intact 11/05/22 1300  Line Care Connections checked and tightened 11/05/22 1300  Line Adjustment (NICU/IV Team Only) No 11/05/22 1300  Dressing Intervention New dressing 11/05/22 1300  Dressing Change Due 11/12/22 11/05/22 1300       Franne Grip Renee 11/05/2022, 1:42 PM

## 2022-11-05 NOTE — Plan of Care (Signed)

## 2022-11-05 NOTE — Progress Notes (Signed)
OT Cancellation Note and OT Screen  Patient Details Name: ISSYS AROMANDO MRN: [id number] DOB: April 08, 1927   Cancelled Treatment:    Reason Eval/Treat Not Completed: OT screened, no needs identified, will sign off (Pt currently at baseline PLOF and performing ADLs and functional mobility Independent to Mod I. Per discussion with RN, PT, and pt, no acute OT needs or equipment needs identified at this time. OT signing off.)   "Orson Eva., OTR/L, MA Acute Rehab [phone]   Lendon Colonel 11/05/2022, 1:39 PM

## 2022-11-05 NOTE — Progress Notes (Signed)
ABI exam has been completed.   Results can be found under chart review under CV PROC. 11/05/2022 5:20 PM  RVT, RDMS

## 2022-11-05 NOTE — Plan of Care (Signed)
  Problem: Education: Goal: Knowledge of General Education information will improve Description: Including pain rating scale, medication(s)/side effects and non-pharmacologic comfort measures 11/05/2022 1619 by Efraim Kaufmann, RN Outcome: Adequate for Discharge 11/05/2022 0810 by Efraim Kaufmann, RN Outcome: Progressing   Problem: Health Behavior/Discharge Planning: Goal: Ability to manage health-related needs will improve 11/05/2022 1619 by Efraim Kaufmann, RN Outcome: Adequate for Discharge 11/05/2022 0810 by Efraim Kaufmann, RN Outcome: Progressing   Problem: Clinical Measurements: Goal: Ability to maintain clinical measurements within normal limits will improve 11/05/2022 1619 by Efraim Kaufmann, RN Outcome: Adequate for Discharge 11/05/2022 0810 by Efraim Kaufmann, RN Outcome: Progressing Goal: Will remain free from infection 11/05/2022 1619 by Efraim Kaufmann, RN Outcome: Adequate for Discharge 11/05/2022 0810 by Efraim Kaufmann, RN Outcome: Progressing Goal: Diagnostic test results will improve 11/05/2022 1619 by Efraim Kaufmann, RN Outcome: Adequate for Discharge 11/05/2022 0810 by Efraim Kaufmann, RN Outcome: Progressing Goal: Respiratory complications will improve 11/05/2022 1619 by Efraim Kaufmann, RN Outcome: Adequate for Discharge 11/05/2022 0810 by Efraim Kaufmann, RN Outcome: Progressing Goal: Cardiovascular complication will be avoided 11/05/2022 1619 by Efraim Kaufmann, RN Outcome: Adequate for Discharge 11/05/2022 0810 by Efraim Kaufmann, RN Outcome: Progressing   Problem: Activity: Goal: Risk for activity intolerance will decrease 11/05/2022 1619 by Efraim Kaufmann, RN Outcome: Adequate for Discharge 11/05/2022 0810 by Efraim Kaufmann, RN Outcome: Progressing   Problem: Nutrition: Goal: Adequate nutrition will be maintained 11/05/2022 1619 by Efraim Kaufmann, RN Outcome: Adequate for Discharge 11/05/2022 0810 by Efraim Kaufmann, RN Outcome:  Progressing   Problem: Coping: Goal: Level of anxiety will decrease 11/05/2022 1619 by Efraim Kaufmann, RN Outcome: Adequate for Discharge 11/05/2022 0810 by Efraim Kaufmann, RN Outcome: Progressing   Problem: Elimination: Goal: Will not experience complications related to bowel motility 11/05/2022 1619 by Efraim Kaufmann, RN Outcome: Adequate for Discharge 11/05/2022 0810 by Efraim Kaufmann, RN Outcome: Progressing Goal: Will not experience complications related to urinary retention 11/05/2022 1619 by Efraim Kaufmann, RN Outcome: Adequate for Discharge 11/05/2022 0810 by Efraim Kaufmann, RN Outcome: Progressing   Problem: Pain Managment: Goal: General experience of comfort will improve 11/05/2022 1619 by Efraim Kaufmann, RN Outcome: Adequate for Discharge 11/05/2022 0810 by Efraim Kaufmann, RN Outcome: Progressing   Problem: Safety: Goal: Ability to remain free from injury will improve 11/05/2022 1619 by Efraim Kaufmann, RN Outcome: Adequate for Discharge 11/05/2022 0810 by Efraim Kaufmann, RN Outcome: Progressing   Problem: Skin Integrity: Goal: Risk for impaired skin integrity will decrease 11/05/2022 1619 by Efraim Kaufmann, RN Outcome: Adequate for Discharge 11/05/2022 0810 by Efraim Kaufmann, RN Outcome: Progressing

## 2022-11-05 NOTE — Progress Notes (Signed)
IV site to right AC swollen, red, painful to touch. D/C'd with catheter tip intact. Attempted to start new IV site x2 w/o success. Will wait until pt gets PICC line inserted and give ordered IV antibiotic

## 2022-11-05 NOTE — Progress Notes (Addendum)
   Subjective: No acute events overnight. No PRNs. No medication refusals. Per chart review, patient reports improving pain and erythema. Per ID note, deep tissue culture today to guide long-term abx treatment.  On interview, patient is in good spirits in no acute distress.  No new symptoms.  Believes swelling and redness have gone down since yesterday.  Noted "a frog in my throat" and asked for cough drops.  No new concerns.  Again discussed plan for long-term IV antibiotics.  Informed patient that we may need to have her here over the weekend to finalize plan.  Patient would like to be kept in the loop on this, as she has plans with family over the weekend. Objective:  BP ~100/58. Vitals otherwise within normal limits. Hemoglobin stable at 10.0 (9.8). No new labs. No new imaging.  Vital signs in last 24 hours: Vitals:   11/04/22 2038 11/05/22 0604 11/05/22 0700 11/05/22 0756  BP: (!) 106/58 (!) 156/65  117/62  Pulse: 64 75  64  Resp: 18 18  14   Temp: 98.4 F (36.9 C) (!) 97.5 F (36.4 C)  98.4 F (36.9 C)  TempSrc: Oral Oral    SpO2: 96% 95%  95%  Weight:   113 lb 1.5 oz (51.3 kg)   Height:       Constitutional: Small, healthy-appearing woman lying in bed, no acute distress CV: Normal heart rate RESP: No increased work of breathing ABD: Soft appearing, no gross abnormalities MSK:  2 x 3 cm right leg wound, with warmth and erythema up to mid shin, bogginess noted on posterior shin.  No gross discharge at this time.  Somewhat less erythematous than yesterday. NEURO: No gross neurological deficits. PSYCH: Pleasant, normal mood and affect.  Assessment/Plan:  Principal Problem:   Cellulitis Active Problems:   Pyogenic inflammation of bone (HCC)  #Cellulitis (?strep): #Likely osteomyelitis: #Goals of CARE discussion:  Vitals within normal limits. Appears to be responding well to IV Cefazolin 2g. Per ID note, their service will follow patient to monitor improvement and discuss  with patient options for long-term Abx.  Per preliminary ID assessment, patient will need at least 2 weeks IV abx.  Deep tissue culture today.  Will receive a PICC line before discharge.  ID suspects strep infection based on appearance of erythema.  Will defer to them for home IV abx recs as well as time course.  Per ortho note, no clear indication for surgical debridement and is signing off at this time. Is available for reconsult for surgical options if IV antibiotic treatment is unsuccessful.  Lower extremity ultrasound ruled out DVT.  ABIs Pending. Will be good for discharge after deep tissue culture rules out MRSA/pseudomonas.   #Stable issues: Follicular lymphoma grade 2: Monitoring with recent imaging.  Receiving no active therapy at this time. Follow-up CT/AP ordered.  Right bundle branch block status post ICD: Continue metoprolol 50 mg Hypothyroidism: Continue Synthroid 88 mcg Major depressive disorder: Continue sertraline 100 mg GERD: Continue pantoprazole 40 mg  Prior to Admission Living Arrangement: Home with son Anticipated Discharge Location: Home Barriers to Discharge: Await deep tissue culture to rule out MRSA/pseudomonas before discharge. Dispo: Anticipated discharge in approximately 1-2 day(s).   Tomie China, MD 11/05/2022, 10:27 AM Pager: [phone] After 5pm on weekdays and 1pm on weekends: On Call pager [phone]

## 2022-11-05 NOTE — Progress Notes (Signed)
PHARMACY CONSULT NOTE FOR:  OUTPATIENT  PARENTERAL ANTIBIOTIC THERAPY (OPAT)  Indication: Tibia osteo Regimen: Rocephin 2g IV every 24 hours End date: 11/19/22  IV antibiotic discharge orders are pended. To discharging provider:  please sign these orders via discharge navigator,  Select New Orders & click on the button choice - Manage This Unsigned Work.     Thank you for allowing pharmacy to be a part of this patient's care.  Georgina Pillion, PharmD, BCPS, BCIDP Infectious Diseases Clinical Pharmacist 11/05/2022 10:49 AM   **Pharmacist phone directory can now be found on [email or web address] ([email or web address]).  Listed under [facility].

## 2022-11-05 NOTE — Discharge Summary (Signed)
Name: Julia Hunt MRN: [id number] DOB: 1927-06-20 89 y.o. PCP: [name], MD  Date of Admission: 11/03/2022  8:42 AM Date of Discharge: 11/05/2022 1:30 PM Attending Physician: Dr. Lafonda Mosses  Discharge Diagnosis: Principal Problem:   Cellulitis Active Problems:   Pyogenic inflammation of bone (HCC)    Discharge Medications: Allergies as of 11/05/2022       Reactions   Other Other (See Comments)   Pt states she is not allergic.   Clarithromycin Other (See Comments)   Pt states she is not allergic.   Sulfa Antibiotics Other (See Comments)   Pt states she is not allergic.        Medication List     TAKE these medications    Calcium Carbonate-Vitamin D 600-400 MG-UNIT tablet Take 1 tablet by mouth daily.   cefTRIAXone IVPB Commonly known as: ROCEPHIN Inject 2 g into the vein daily for 14 days. Indication:  Tibia osteo First Dose: Yes Last Day of Therapy:  11/19/22 Labs - Once weekly:  CBC/D and BMP, ESR and CRP Method of administration: IV Push Method of administration may be changed at the discretion of home infusion pharmacist based upon assessment of the patient and/or caregiver's ability to self-administer the medication ordered.   levothyroxine 88 MCG tablet Commonly known as: SYNTHROID Take 88 mcg by mouth daily. Rotates it with every other day What changed: Another medication with the same name was removed. Continue taking this medication, and follow the directions you see here.   menthol-cetylpyridinium 3 MG lozenge Commonly known as: CEPACOL Take 1 lozenge (3 mg total) by mouth as needed for sore throat.   metoprolol succinate 50 MG 24 hr tablet Commonly known as: TOPROL-XL Take 1 tablet (50 mg total) by mouth daily. Take with or immediately following a meal.   multivitamin capsule Take 1 capsule by mouth daily.   omeprazole 20 MG capsule Commonly known as: PRILOSEC Take 20 mg by mouth as needed (heartburn).   sertraline 100 MG  tablet Commonly known as: ZOLOFT Take 100 mg by mouth daily.   traMADol 50 MG tablet Commonly known as: ULTRAM Take 50 mg by mouth daily.               Discharge Care Instructions  (From admission, onward)           Start     Ordered   11/05/22 0000  Change dressing on IV access line weekly and PRN  (Home infusion instructions - [facility] )        11/05/22 1110            Disposition and follow-up:   Julia Hunt was discharged from [facility] in Good condition.  At the hospital follow up visit please address:  1.  Follow-up:  a. Osteomyelitis/cellulitis of anterior RLE: Discharged on IV ceftriaxone 2g every day for two weeks (8/9-8/23/2024) followed by PO cefadroxil 500mg  BID . Please check white count and examine wound progression.   2.  Labs / imaging needed at time of follow-up: CBC, BMP.  3.  Pending labs/ test needing follow-up: Aerobic culture with Gram stain of right lower extremity wound collected 11/05/2022 at 10:39 AM.  4.  Medication Changes  Abx -  As above.   Follow-up Appointments:  Follow-up Information     [facility] Follow up.   Why: [facility] will be providing IV anitbiotics for home.  Hospital Course by problem list:  #Cellulitis: #Pretibial osteomyelitis:  Patient presented with chronic 2 x 3 right lower extremity pretibial wound with swelling and erythema to mid shin, pathognomonic of cellulitis and suspicious of osteomyelitis.  Sed rate: 38 and CRP 1.9.  Patient was afebrile throughout stay.  Patient received Tylenol 650 mg x 1 for mild pain.  BMP unremarkable.  Ortho and ID were consulted early in the course.  Patient preference was long-term IV antibiotics over definitive treatment (right BKA).  Ultrasound of right lower extremity was negative for DVT.   Patient was initially started on IV ceftriaxone 1 g, increased to 2 g H last 7.  MR imaging of right foot/tibia on 8/8 indicated  osteomyelitis picture.  On 8/8, patient was switched to IV cefazolin per ID recs.  Patient noted improvement of erythema and swelling over course of stay in the setting of IV antibiotics.  Discharged on 2 weeks IV ceftriaxone 2 g every day with p.o. cefadroxil 500 mg twice daily afterwards.  Ortho found no clear indication for surgical debridement at this time.  ID took superficial specimen of wound for aerobic cultures, pending.  As patient improved on IV cephalosporins, MRSA/Pseudomonas picture is unlikely.  Will adjust IV antibiotics course if necessary. ABIs still pending.    #Stable issues: Follicular lymphoma grade 2: Monitoring with recent imaging.  Patient received no active therapy during stay.  Right bundle branch block status post ICD: Continued metoprolol 50 mg. Hypothyroidism: Continued Synthroid 88 mcg. Major depressive disorder: Continued sertraline 100 mg. GERD: Continued pantoprazole 40 mg.   Discharge Subjective:  No acute events overnight. No PRNs. No medication refusals. Per chart review, patient reports improving pain and erythema. Per ID note, deep tissue culture today to guide long-term abx treatment.   On interview, patient is in good spirits in no acute distress.  No new symptoms.  Believes swelling and redness have gone down since yesterday.  Noted "a frog in my throat" and asked for cough drops.  No new concerns.  Again discussed plan for long-term IV antibiotics.    Discharge Exam:   BP 117/62 (BP Location: Left Arm)   Pulse 64   Temp 98.4 F (36.9 C)   Resp 14   Ht 5' (1.524 m)   Wt 113 lb 1.5 oz (51.3 kg)   SpO2 95%   BMI 22.09 kg/m  Constitutional: Small, healthy-appearing woman lying in bed, no acute distress CV: Normal heart rate RESP: No increased work of breathing ABD: Soft appearing, no gross abnormalities MSK:  2 x 3 cm right leg wound, with warmth and erythema up to mid shin, bogginess noted on posterior shin.  No gross discharge at this time.   Somewhat less erythematous than yesterday. NEURO: No gross neurological deficits. PSYCH: Pleasant, normal mood and affect.  Pertinent Labs, Studies, and Procedures:     Latest Ref Rng & Units 11/05/2022    3:37 AM 11/04/2022   12:52 AM 11/03/2022   10:36 AM  CBC  WBC 4.0 - 10.5 K/uL 6.2  6.3  6.2   Hemoglobin 12.0 - 15.0 g/dL 29.5  9.8  28.4   Hematocrit 36.0 - 46.0 % 31.3  30.3  34.9   Platelets 150 - 400 K/uL 187  173  165        Latest Ref Rng & Units 11/04/2022   12:52 AM 11/03/2022   10:36 AM 05/13/2022    2:36 PM  CMP  Glucose 70 - 99 mg/dL 95  132  114   BUN 8 - 23 mg/dL 17  16  20    Creatinine 0.44 - 1.00 mg/dL 0.86  5.78  4.69   Sodium 135 - 145 mmol/L 143  138  140   Potassium 3.5 - 5.1 mmol/L 3.9  3.7  3.8   Chloride 98 - 111 mmol/L 107  103  107   CO2 22 - 32 mmol/L 24  22  25    Calcium 8.9 - 10.3 mg/dL 8.6  8.9  9.2   Total Protein 6.5 - 8.1 g/dL  6.1  6.8   Total Bilirubin 0.3 - 1.2 mg/dL  0.3  0.5   Alkaline Phos 38 - 126 U/L  65  67   AST 15 - 41 U/L  19  17   ALT 0 - 44 U/L  16  13     MR FOOT RIGHT W WO CONTRAST  Result Date: 11/04/2022 CLINICAL DATA:  Soft tissue infection suspected. EXAM: MRI OF THE RIGHT FOREFOOT WITHOUT AND WITH CONTRAST TECHNIQUE: Multiplanar, multisequence MR imaging of the right foot was performed before and after the administration of intravenous contrast. CONTRAST:  5mL GADAVIST GADOBUTROL 1 MMOL/ML IV SOLN COMPARISON:  Radiographs 11/03/2022. MRI of the right ankle 12/09/2021. FINDINGS: Lower leg findings are dictated separately. Bones/Joint/Cartilage Stable postsurgical changes from previous tibiotalar arthrodesis. The distal tibia and distal fibula are solidly fused with the talus. There is new/increased subcortical marrow edema and enhancement anteriorly in the distal tibia and talus compared with the previous MRI. There is no evidence of acute fracture or dislocation. Mild-to-moderate talonavicular and mild subtalar degenerative changes are  stable. There is no evidence of osteomyelitis or septic arthritis within the foot. Ligaments Intact Lisfranc ligament. Intact collateral ligaments of the metatarsophalangeal joints. Muscles and Tendons Chronic severe fatty atrophy throughout the foot musculature. No acute tendon abnormalities are identified. As noted previously, there is chronic fibrosis and calcification anteriorly along the anterior extensor tendons which appears unchanged. Soft tissues Generalized subcutaneous edema and low level enhancement throughout the foot without focal fluid collection. There is some enhancement within the pretibial soft tissues, better seen on separate examination of the lower leg. IMPRESSION: 1. New/increased subcortical marrow edema and enhancement anteriorly in the distal tibia and talus compared with the previous MRI. These findings are suspicious for osteomyelitis given adjacent soft tissue edema and enhancement, further described on separate examination of the lower leg. 2. No evidence of osteomyelitis or septic arthritis within the foot. 3. Remote ankle fusion with solid ankylosis. Stable tibiotalar and subtalar degenerative changes. 4. Right lower leg findings dictated separately. Electronically Signed   By: Carey Bullocks M.D.   On: 11/04/2022 12:15   MR TIBIA FIBULA RIGHT W WO CONTRAST  Result Date: 11/04/2022 CLINICAL DATA:  Soft tissue infection suspected. EXAM: MRI OF LOWER RIGHT EXTREMITY WITHOUT AND WITH CONTRAST TECHNIQUE: Multiplanar, multisequence MR imaging of the right lower leg was performed both before and after administration of intravenous contrast. CONTRAST:  5mL GADAVIST GADOBUTROL 1 MMOL/ML IV SOLN COMPARISON:  Radiographs 11/03/2022. MRI of the right ankle 12/09/2021. FINDINGS: Foot findings are dictated separately. Bones/Joint/Cartilage As noted on the examination of the right foot, the patient is status post remote tibiotalar arthrodesis with solid ankylosis between the distal tibia,  distal fibula and the talus. There is cortical erosion along the anterior aspect of the distal tibia (image 62/11) with underlying a subcortical edema and low level enhancement. Low level marrow edema and enhancement extend into the dorsal aspect of the adjacent  talus. These findings appear new/progressive from previous MRI and could reflect osteomyelitis. No acute or significant osseous findings are seen in the proximal lower leg. There are mild tricompartmental degenerative changes at the right knee with a small joint effusion. Probable underlying bone infarct anteriorly in the lateral femoral condyle. Incidental imaging of the left lower leg demonstrates no significant osseous findings. Ligaments No significant ligamentous abnormalities in the lower leg. Muscles and Tendons Severe fatty atrophy throughout the right lower leg musculature attributed to polio. No intramuscular edema, focal fluid collection or abnormal enhancement. Soft tissues Asymmetric subcutaneous edema throughout the right lower leg. Apparent skin ulceration anteriorly in the distal lower leg with heterogeneous enhancement consistent with soft tissue infection. This abuts the cortical and marrow findings in the distal tibia and talus described above, increasing the suspicion for osteomyelitis, especially if there is an open wound in this area. No focal fluid collection identified. As noted on the prior radiographs, there are extensive chronic soft tissue calcifications anteriorly in the distal lower leg. IMPRESSION: 1. Apparent skin ulceration anteriorly in the distal lower leg with underlying heterogeneous enhancement consistent with soft tissue infection. No focal fluid collection identified. 2. Cortical erosion along the anterior aspect of the distal tibia and adjacent talus with low level marrow edema and enhancement, suspicious for osteomyelitis. 3. No acute osseous findings in the proximal lower leg. 4. Severe fatty atrophy of the right  lower leg musculature attributed to polio. 5. Remote tibiotalar arthrodesis with solid ankylosis between the distal tibia, distal fibula and the talus. 6. Right foot findings dictated separately. Electronically Signed   By: Carey Bullocks M.D.   On: 11/04/2022 12:13   VAS [place] LOWER EXTREMITY VENOUS (DVT)  Result Date: 11/04/2022  Lower Venous DVT Study Patient Name:  Julia Hunt  Date of Exam:   11/04/2022 Medical Rec #: [id number]         Accession #:    [id number] Date of Birth: 1928/01/20        Patient Gender: F Patient Age:   33 years Exam Location:  [facility] Procedure:      VAS [place] LOWER EXTREMITY VENOUS (DVT) Referring Phys: Montez Morita --------------------------------------------------------------------------------  Indications: Swelling, and Erythema.  Risk Factors: Ulceration right ankle. History of Polio affecting right leg, history of ankle surgeries in youth. Comparison Study: No prior study on file Performing Technologist: Sherren Kerns RVS  Examination Guidelines: A complete evaluation includes B-mode imaging, spectral Doppler, color Doppler, and power Doppler as needed of all accessible portions of each vessel. Bilateral testing is considered an integral part of a complete examination. Limited examinations for reoccurring indications may be performed as noted. The reflux portion of the exam is performed with the patient in reverse Trendelenburg.  +---------+---------------+---------+-----------+----------+--------------+ RIGHT    CompressibilityPhasicitySpontaneityPropertiesThrombus Aging +---------+---------------+---------+-----------+----------+--------------+ CFV      Full           Yes      Yes                                 +---------+---------------+---------+-----------+----------+--------------+ SFJ      Full                                                        +---------+---------------+---------+-----------+----------+--------------+  FV Prox  Full                                                         +---------+---------------+---------+-----------+----------+--------------+ FV Mid   Full                                                        +---------+---------------+---------+-----------+----------+--------------+ FV DistalFull                                                        +---------+---------------+---------+-----------+----------+--------------+ PFV      Full                                                        +---------+---------------+---------+-----------+----------+--------------+ POP      Full           Yes      Yes                                 +---------+---------------+---------+-----------+----------+--------------+ PTV      Full                                                        +---------+---------------+---------+-----------+----------+--------------+ PERO     Full                                                        +---------+---------------+---------+-----------+----------+--------------+ Gastroc  Full                                                        +---------+---------------+---------+-----------+----------+--------------+   +----+---------------+---------+-----------+----------+--------------+ LEFTCompressibilityPhasicitySpontaneityPropertiesThrombus Aging +----+---------------+---------+-----------+----------+--------------+ CFV Full           Yes      Yes                                 +----+---------------+---------+-----------+----------+--------------+    Summary: RIGHT: - No evidence of common femoral vein obstruction. - No cystic structure found in the popliteal fossa.  LEFT: - No evidence of common femoral vein obstruction.  *See table(s) above for measurements and observations. Electronically signed by Coral Else MD on 11/04/2022 at 10:00:21 AM.    Final  DG Foot Complete Right  Result Date: 11/03/2022 CLINICAL DATA:  Wound  check.  Pain EXAM: RIGHT FOOT COMPLETE - 3 VIEW; RIGHT ANKLE - COMPLETE 3 VIEW COMPARISON:  X-ray 10/21/2022 FINDINGS: Once again there is diffuse osteopenia. Diffuse soft tissue swelling. Once again there is extensive soft tissue ossification identified along the anterior aspect of the distal tibia and fibula and proximal ankle. Previous arthrodesis of the tibiotalar joint. No acute fracture or dislocation. No definite erosive changes identified at this time. If there is further concern of bone infection, MRI or bone scan could be performed as clinically appropriate for further sensitivity. IMPRESSION: Once again there is severe osteopenia. Previous arthrodesis of the ankle joint. Soft tissue swelling as well as ossification anterior to the ankle with soft tissue irregularity. Electronically Signed   By: Karen Kays M.D.   On: 11/03/2022 14:11   DG Ankle Complete Right  Result Date: 11/03/2022 CLINICAL DATA:  Wound check.  Pain EXAM: RIGHT FOOT COMPLETE - 3 VIEW; RIGHT ANKLE - COMPLETE 3 VIEW COMPARISON:  X-ray 10/21/2022 FINDINGS: Once again there is diffuse osteopenia. Diffuse soft tissue swelling. Once again there is extensive soft tissue ossification identified along the anterior aspect of the distal tibia and fibula and proximal ankle. Previous arthrodesis of the tibiotalar joint. No acute fracture or dislocation. No definite erosive changes identified at this time. If there is further concern of bone infection, MRI or bone scan could be performed as clinically appropriate for further sensitivity. IMPRESSION: Once again there is severe osteopenia. Previous arthrodesis of the ankle joint. Soft tissue swelling as well as ossification anterior to the ankle with soft tissue irregularity. Electronically Signed   By: Karen Kays M.D.   On: 11/03/2022 14:11   DG Tibia/Fibula Right  Result Date: 11/03/2022 CLINICAL DATA:  89 year old female with a history of right foot pain and a wound EXAM: RIGHT TIBIA AND  FIBULA - 2 VIEW COMPARISON:  09/30/2021, 02/12/2017 FINDINGS: Osteopenia.  No displaced fracture. Angulation in the proximal fibular diaphysis on the frontal view, present on remote comparison plain film and chronic. Dystrophic soft tissue calcifications in the distal lower leg. Associated with irregularity in the overlying soft tissues compatible with the given history. Tibiotalar fusion at the ankle. Degenerative changes at the knee joint. Soft tissue swelling of the right leg. IMPRESSION: Negative for acute bony abnormality. Dystrophic soft tissue calcifications of the distal lower leg associated with irregularity of the overlying soft tissues, compatible with given history of chronic wound. Electronically Signed   By: Gilmer Mor D.O.   On: 11/03/2022 14:09     Discharge Instructions:  Ms. Tepper It was a pleasure taking care of you at [facility]. You were admitted for cellulitis and treated for both cellulitis and osteomyelitis. We are discharging you home now that you are doing better. Please follow the following instructions:  1) Continue intravenous Ceftriaxone 2g every day for two weeks (8/9-8/23) through your PICC line. Afterwards, take cefadroxil 500 mg by mouth twice daily as recommended by the infectious disease team.  2) Follow up with your infectious disease outpatient appointment on 11/17/2022 at 11:15 a.m. with Blanchard Kelch, NP and also 12/14/2022 at 3:00 p.m. with Judyann Munson, MD. 3) Be aware of any changes in your wound's status. If you notice any new redness, swelling, fever, chills, pain or discharge, please present to the emergency department.  4) Please continue to take all of your other medications as prescribed.   Take care,  Dr.  Tomie China, MD   Discharge Instructions     [facility] pharmacist to adjust dose for Vancomycin, Aminoglycosides and other anti-infective therapies as requested by physician.   Complete by: As directed     [facility] to provide Cath Flo 2mg    Complete by: As directed    Administer for PICC line occlusion and as ordered by physician for other access device issues.   Anaphylaxis Kit: Provided to treat any anaphylactic reaction to the medication being provided to the patient if First Dose or when requested by physician   Complete by: As directed    Epinephrine 1mg /ml vial / amp: Administer 0.3mg  (0.80ml) subcutaneously once for moderate to severe anaphylaxis, nurse to call physician and pharmacy when reaction occurs and call 911 if needed for immediate care   Diphenhydramine 50mg /ml IV vial: Administer 25-50mg  IV/IM PRN for first dose reaction, rash, itching, mild reaction, nurse to call physician and pharmacy when reaction occurs   Sodium Chloride 0.9% NS IV: Administer if needed for hypovolemic blood pressure drop or as ordered by physician after call to physician with anaphylactic reaction   Change dressing on IV access line weekly and PRN   Complete by: As directed    Flush IV access with Sodium Chloride 0.9% and Heparin 10 units/ml or 100 units/ml   Complete by: As directed    Home infusion instructions - [facility]   Complete by: As directed    Instructions: Flush IV access with Sodium Chloride 0.9% and Heparin 10units/ml or 100units/ml   Change dressing on IV access line: Weekly and PRN   Instructions Cath Flo 2mg : Administer for PICC Line occlusion and as ordered by physician for other access device   [facility] pharmacist to adjust dose for: Vancomycin, Aminoglycosides and other anti-infective therapies as requested by physician   Method of administration may be changed at the discretion of home infusion pharmacist based upon assessment of the patient and/or caregiver's ability to self-administer the medication ordered   Complete by: As directed        Signed: Tomie China, MD 11/05/2022, 2:36 PM   Pager: [phone]

## 2022-11-05 NOTE — Progress Notes (Signed)
[facility]    Date of Admission:  11/03/2022   Total days of antibiotics 2           ID: Julia Hunt is a 89 y.o. female with  right leg chronic wound and distal tibial osteomyelitis Principal Problem:   Cellulitis Active Problems:   Pyogenic inflammation of bone (HCC)    Subjective: Afebrile, erythema mostly improved, but still swelling to right lower extremity  Medications:   enoxaparin (LOVENOX) injection  30 mg Subcutaneous Q24H   levothyroxine  88 mcg Oral Daily   metoprolol succinate  50 mg Oral Daily   pantoprazole  40 mg Oral Daily   polyethylene glycol  17 g Oral Daily   sertraline  100 mg Oral Daily   traMADol  50 mg Oral Daily    Objective: Vital signs in last 24 hours: Temp:  [97.5 F (36.4 C)-98.5 F (36.9 C)] 98.4 F (36.9 C) (08/09 0756) Pulse Rate:  [62-75] 64 (08/09 0756) Resp:  [14-18] 14 (08/09 0756) BP: (101-156)/(58-65) 117/62 (08/09 0756) SpO2:  [95 %-96 %] 95 % (08/09 0756) Weight:  [51.3 kg] 51.3 kg (08/09 0700) Physical Exam  Constitutional:  oriented to person, place, and time. appears well-developed and well-nourished. No distress.  HENT: [place]/AT, PERRLA, no scleral icterus Mouth/Throat: Oropharynx is clear and moist. No oropharyngeal exudate.  Cardiovascular: Normal rate, regular rhythm and normal heart sounds. Exam reveals no gallop and no friction rub.  No murmur heard.  Pulmonary/Chest: Effort normal and breath sounds normal. No respiratory distress.  has no wheezes.  Neck = supple, no nuchal rigidity Abdominal: Soft. Bowel sounds are normal.  exhibits no distension. There is no tenderness.  Lymphadenopathy: no cervical adenopathy. No axillary adenopathy Neurological: alert and oriented to person, place, and time.  Skin: Skin is warm and dry. No rash noted. No erythema. Erythema improved. Small shallow ulcer not having significant drainage.  XBM:WUXLK leg pitting edema Psychiatric: a normal mood and  affect.  behavior is normal.    Lab Results Recent Labs    11/03/22 1036 11/04/22 0052 11/05/22 0337  WBC 6.2 6.3 6.2  HGB 11.3* 9.8* 10.0*  HCT 34.9* 30.3* 31.3*  NA 138 143  --   K 3.7 3.9  --   CL 103 107  --   CO2 22 24  --   BUN 16 17  --   CREATININE 0.86 0.75  --    Liver Panel Recent Labs    11/03/22 1036  PROT 6.1*  ALBUMIN 3.5  AST 19  ALT 16  ALKPHOS 65  BILITOT 0.3   Sedimentation Rate Recent Labs    11/03/22 1036  ESRSEDRATE 38*   C-Reactive Protein Recent Labs    11/03/22 1246 11/03/22 2113  CRP 2.1* 1.9*    Microbiology: ------------- Studies/Results: [place] EKG SITE RITE  Result Date: 11/05/2022 If Site Rite image not attached, placement could not be confirmed due to current cardiac rhythm.  MR FOOT RIGHT W WO CONTRAST  Result Date: 11/04/2022 CLINICAL DATA:  Soft tissue infection suspected. EXAM: MRI OF THE RIGHT FOREFOOT WITHOUT AND WITH CONTRAST TECHNIQUE: Multiplanar, multisequence MR imaging of the right foot was performed before and after the administration of intravenous contrast. CONTRAST:  5mL GADAVIST GADOBUTROL 1 MMOL/ML IV SOLN COMPARISON:  Radiographs 11/03/2022. MRI of the right ankle 12/09/2021. FINDINGS: Lower leg findings are dictated separately. Bones/Joint/Cartilage Stable postsurgical changes from previous tibiotalar arthrodesis. The distal tibia and distal fibula are solidly fused  with the talus. There is new/increased subcortical marrow edema and enhancement anteriorly in the distal tibia and talus compared with the previous MRI. There is no evidence of acute fracture or dislocation. Mild-to-moderate talonavicular and mild subtalar degenerative changes are stable. There is no evidence of osteomyelitis or septic arthritis within the foot. Ligaments Intact Lisfranc ligament. Intact collateral ligaments of the metatarsophalangeal joints. Muscles and Tendons Chronic severe fatty atrophy throughout the foot musculature. No acute tendon  abnormalities are identified. As noted previously, there is chronic fibrosis and calcification anteriorly along the anterior extensor tendons which appears unchanged. Soft tissues Generalized subcutaneous edema and low level enhancement throughout the foot without focal fluid collection. There is some enhancement within the pretibial soft tissues, better seen on separate examination of the lower leg. IMPRESSION: 1. New/increased subcortical marrow edema and enhancement anteriorly in the distal tibia and talus compared with the previous MRI. These findings are suspicious for osteomyelitis given adjacent soft tissue edema and enhancement, further described on separate examination of the lower leg. 2. No evidence of osteomyelitis or septic arthritis within the foot. 3. Remote ankle fusion with solid ankylosis. Stable tibiotalar and subtalar degenerative changes. 4. Right lower leg findings dictated separately. Electronically Signed   By: Carey Bullocks M.D.   On: 11/04/2022 12:15   MR TIBIA FIBULA RIGHT W WO CONTRAST  Result Date: 11/04/2022 CLINICAL DATA:  Soft tissue infection suspected. EXAM: MRI OF LOWER RIGHT EXTREMITY WITHOUT AND WITH CONTRAST TECHNIQUE: Multiplanar, multisequence MR imaging of the right lower leg was performed both before and after administration of intravenous contrast. CONTRAST:  5mL GADAVIST GADOBUTROL 1 MMOL/ML IV SOLN COMPARISON:  Radiographs 11/03/2022. MRI of the right ankle 12/09/2021. FINDINGS: Foot findings are dictated separately. Bones/Joint/Cartilage As noted on the examination of the right foot, the patient is status post remote tibiotalar arthrodesis with solid ankylosis between the distal tibia, distal fibula and the talus. There is cortical erosion along the anterior aspect of the distal tibia (image 62/11) with underlying a subcortical edema and low level enhancement. Low level marrow edema and enhancement extend into the dorsal aspect of the adjacent talus. These findings  appear new/progressive from previous MRI and could reflect osteomyelitis. No acute or significant osseous findings are seen in the proximal lower leg. There are mild tricompartmental degenerative changes at the right knee with a small joint effusion. Probable underlying bone infarct anteriorly in the lateral femoral condyle. Incidental imaging of the left lower leg demonstrates no significant osseous findings. Ligaments No significant ligamentous abnormalities in the lower leg. Muscles and Tendons Severe fatty atrophy throughout the right lower leg musculature attributed to polio. No intramuscular edema, focal fluid collection or abnormal enhancement. Soft tissues Asymmetric subcutaneous edema throughout the right lower leg. Apparent skin ulceration anteriorly in the distal lower leg with heterogeneous enhancement consistent with soft tissue infection. This abuts the cortical and marrow findings in the distal tibia and talus described above, increasing the suspicion for osteomyelitis, especially if there is an open wound in this area. No focal fluid collection identified. As noted on the prior radiographs, there are extensive chronic soft tissue calcifications anteriorly in the distal lower leg. IMPRESSION: 1. Apparent skin ulceration anteriorly in the distal lower leg with underlying heterogeneous enhancement consistent with soft tissue infection. No focal fluid collection identified. 2. Cortical erosion along the anterior aspect of the distal tibia and adjacent talus with low level marrow edema and enhancement, suspicious for osteomyelitis. 3. No acute osseous findings in the proximal lower leg. 4.  Severe fatty atrophy of the right lower leg musculature attributed to polio. 5. Remote tibiotalar arthrodesis with solid ankylosis between the distal tibia, distal fibula and the talus. 6. Right foot findings dictated separately. Electronically Signed   By: Carey Bullocks M.D.   On: 11/04/2022 12:13   VAS [place] LOWER  EXTREMITY VENOUS (DVT)  Result Date: 11/04/2022  Lower Venous DVT Study Patient Name:  Julia Hunt  Date of Exam:   11/04/2022 Medical Rec #: [id number]         Accession #:    [id number] Date of Birth: 06-24-1927        Patient Gender: F Patient Age:   33 years Exam Location:  [facility] Procedure:      VAS [place] LOWER EXTREMITY VENOUS (DVT) Referring Phys: Montez Morita --------------------------------------------------------------------------------  Indications: Swelling, and Erythema.  Risk Factors: Ulceration right ankle. History of Polio affecting right leg, history of ankle surgeries in youth. Comparison Study: No prior study on file Performing Technologist: Sherren Kerns RVS  Examination Guidelines: A complete evaluation includes B-mode imaging, spectral Doppler, color Doppler, and power Doppler as needed of all accessible portions of each vessel. Bilateral testing is considered an integral part of a complete examination. Limited examinations for reoccurring indications may be performed as noted. The reflux portion of the exam is performed with the patient in reverse Trendelenburg.  +---------+---------------+---------+-----------+----------+--------------+ RIGHT    CompressibilityPhasicitySpontaneityPropertiesThrombus Aging +---------+---------------+---------+-----------+----------+--------------+ CFV      Full           Yes      Yes                                 +---------+---------------+---------+-----------+----------+--------------+ SFJ      Full                                                        +---------+---------------+---------+-----------+----------+--------------+ FV Prox  Full                                                        +---------+---------------+---------+-----------+----------+--------------+ FV Mid   Full                                                         +---------+---------------+---------+-----------+----------+--------------+ FV DistalFull                                                        +---------+---------------+---------+-----------+----------+--------------+ PFV      Full                                                        +---------+---------------+---------+-----------+----------+--------------+  POP      Full           Yes      Yes                                 +---------+---------------+---------+-----------+----------+--------------+ PTV      Full                                                        +---------+---------------+---------+-----------+----------+--------------+ PERO     Full                                                        +---------+---------------+---------+-----------+----------+--------------+ Gastroc  Full                                                        +---------+---------------+---------+-----------+----------+--------------+   +----+---------------+---------+-----------+----------+--------------+ LEFTCompressibilityPhasicitySpontaneityPropertiesThrombus Aging +----+---------------+---------+-----------+----------+--------------+ CFV Full           Yes      Yes                                 +----+---------------+---------+-----------+----------+--------------+    Summary: RIGHT: - No evidence of common femoral vein obstruction. - No cystic structure found in the popliteal fossa.  LEFT: - No evidence of common femoral vein obstruction.  *See table(s) above for measurements and observations. Electronically signed by Coral Else MD on 11/04/2022 at 10:00:21 AM.    Final    DG Foot Complete Right  Result Date: 11/03/2022 CLINICAL DATA:  Wound check.  Pain EXAM: RIGHT FOOT COMPLETE - 3 VIEW; RIGHT ANKLE - COMPLETE 3 VIEW COMPARISON:  X-ray 10/21/2022 FINDINGS: Once again there is diffuse osteopenia. Diffuse soft tissue swelling. Once again there is  extensive soft tissue ossification identified along the anterior aspect of the distal tibia and fibula and proximal ankle. Previous arthrodesis of the tibiotalar joint. No acute fracture or dislocation. No definite erosive changes identified at this time. If there is further concern of bone infection, MRI or bone scan could be performed as clinically appropriate for further sensitivity. IMPRESSION: Once again there is severe osteopenia. Previous arthrodesis of the ankle joint. Soft tissue swelling as well as ossification anterior to the ankle with soft tissue irregularity. Electronically Signed   By: Karen Kays M.D.   On: 11/03/2022 14:11   DG Ankle Complete Right  Result Date: 11/03/2022 CLINICAL DATA:  Wound check.  Pain EXAM: RIGHT FOOT COMPLETE - 3 VIEW; RIGHT ANKLE - COMPLETE 3 VIEW COMPARISON:  X-ray 10/21/2022 FINDINGS: Once again there is diffuse osteopenia. Diffuse soft tissue swelling. Once again there is extensive soft tissue ossification identified along the anterior aspect of the distal tibia and fibula and proximal ankle. Previous arthrodesis of the tibiotalar joint. No acute fracture or dislocation. No definite erosive changes identified at this time. If there is further concern of bone infection, MRI  or bone scan could be performed as clinically appropriate for further sensitivity. IMPRESSION: Once again there is severe osteopenia. Previous arthrodesis of the ankle joint. Soft tissue swelling as well as ossification anterior to the ankle with soft tissue irregularity. Electronically Signed   By: Karen Kays M.D.   On: 11/03/2022 14:11   DG Tibia/Fibula Right  Result Date: 11/03/2022 CLINICAL DATA:  89 year old female with a history of right foot pain and a wound EXAM: RIGHT TIBIA AND FIBULA - 2 VIEW COMPARISON:  09/30/2021, 02/12/2017 FINDINGS: Osteopenia.  No displaced fracture. Angulation in the proximal fibular diaphysis on the frontal view, present on remote comparison plain film and  chronic. Dystrophic soft tissue calcifications in the distal lower leg. Associated with irregularity in the overlying soft tissues compatible with the given history. Tibiotalar fusion at the ankle. Degenerative changes at the knee joint. Soft tissue swelling of the right leg. IMPRESSION: Negative for acute bony abnormality. Dystrophic soft tissue calcifications of the distal lower leg associated with irregularity of the overlying soft tissues, compatible with given history of chronic wound. Electronically Signed   By: Gilmer Mor D.O.   On: 11/03/2022 14:09     Assessment/Plan: Distal tibial osteomyelitis = will get picc line and plan for 2 wks of ceftriaxone 2gm iv daily. We will do superficial swab to exclude mrsa/pseudomonas to decide if need to add anti mrsa/pseudomonal coverage. I suspect that since she has improved significantly with cefazolin that mrsa/psa may not be involved.   We will then treat with oral abtx thereafter, can do cefadroxil 500mg  po bid, dose adjusted for her renal function  Lower extremity edema = gave patient recs on using compression socks  Wound care = continue with medicated vaseline dressing (xeroform) and cover with padded gauze.  We will see back in the ID clinic in 2 wks.  Will sign off. ------------- Diagnosis: Tibial osteo of right leg  Culture Result: pending  Allergies  Allergen Reactions   Other Other (See Comments)    Pt states she is not allergic.   Clarithromycin Other (See Comments)    Pt states she is not allergic.   Sulfa Antibiotics Other (See Comments)    Pt states she is not allergic.    OPAT Orders Discharge antibiotics to be given via PICC line Discharge antibiotics: ceftriaxone 2gm IV daily Per pharmacy protocol   Duration:  2 wk End Date: 11/19/2022  [facility] Care Per Protocol:  Home health RN for IV administration and teaching; PICC line care and labs.    Labs weekly while on IV antibiotics: _x_ CBC with differential _x_  BMP _x_ CRP _x_ ESR   _x_ Please pull PIC at completion of IV antibiotics  Fax weekly labs to [phone]  Clinic Follow Up Appt: 2 wk with Rexene Alberts    [facility] Pager: [phone]  11/05/2022, 12:04 PM

## 2022-11-05 NOTE — Hospital Course (Signed)
11/04/2022  Laying in bed comfortably Denies any SOB,chest pain. Julia Hunt
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
## 2022-11-05 NOTE — Evaluation (Signed)
Physical Therapy Evaluation/ Discharge Patient Details Name: Julia Hunt MRN: [id number] DOB: [birth year] Today's Date: 11/05/2022  History of Present Illness  89 yo female admitted 8/7 with RLE cellulitis. PMHx: post polio syndrome, PPM, follicular lymphoma, HLD, Lt knee OA, HTN, SDH  Clinical Impression  Pt  very pleasant, lives at home with son and performs her own ADLs, cooks and uses KAFO and RW for mobility with WC use only for long distances if needed. Pt is a retired Char[employer] fundraiser and able to perform long sitting with full hip flexion to reach Rt foot to don KAFO and sock/shoe. Pt able to perform all basic mobility, ADLs, and gait without assist. Area of cellulitis is below KAFO and pt encouraged to follow up with orthotist as her brace is >63yrs old. Pt mod I with all activity, at baseline function and no further therapy needs at this time with pt aware and agreeable, will sign off.       If plan is discharge home, recommend the following: Assist for transportation   Can travel by private vehicle        Equipment Recommendations None recommended by PT  Recommendations for Other Services       Functional Status Assessment Patient has not had a recent decline in their functional status     Precautions / Restrictions Precautions Precautions: Fall Required Braces or Orthoses: Other Brace Other Brace: RLE KAFO      Mobility  Bed Mobility Overal bed mobility: Modified Independent             General bed mobility comments: pt uses UB to move RLE to EOB    Transfers Overall transfer level: Modified independent                      Ambulation/Gait Ambulation/Gait assistance: Modified independent (Device/Increase time) Gait Distance (Feet): 400 Feet Assistive device: Rolling walker (2 wheels) Gait Pattern/deviations: Step-to pattern, Decreased weight shift to right   Gait velocity interpretation: >2.62 ft/sec, indicative of community ambulatory   General Gait  Details: RLE KAFO with decreased knee flexion and dorsiflexion with pt using circumduction to clear the leg, steady with use of RW  Stairs            Wheelchair Mobility     Tilt Bed    Modified Rankin (Stroke Patients Only)       Balance Overall balance assessment: Mild deficits observed, not formally tested                                           Pertinent Vitals/Pain Pain Assessment Pain Assessment: No/denies pain    Home Living Family/patient expects to be discharged to:: Private residence Living Arrangements: Children Available Help at Discharge: Family;Available 24 hours/day Type of Home: House         Home Layout: Two level;Able to live on main level with bedroom/bathroom;Laundry or work area in [employer] Equipment: Agricultural consultant (2 wheels);Shower seat;Grab bars - tub/shower;Hand held shower head;Wheelchair - manual;BSC/3in1;Crutches;Cane - single point      Prior Function Prior Level of Function : Independent/Modified Independent               ADLs Comments: doesn't drive, does the cooking and her own ADLs     Extremity/Trunk Assessment   Upper Extremity Assessment Upper Extremity Assessment: Overall WFL for tasks assessed  Lower Extremity Assessment Lower Extremity Assessment: RLE deficits/detail RLE Deficits / Details: post polio syndrome with baseline paralysis, wears KAFO    Cervical / Trunk Assessment Cervical / Trunk Assessment: Normal  Communication   Communication Communication: Hearing impairment  Cognition Arousal: Alert Behavior During Therapy: WFL for tasks assessed/performed Overall Cognitive Status: Within Functional Limits for tasks assessed                                          General Comments      Exercises     Assessment/Plan    PT Assessment Patient does not need any further PT services  PT Problem List         PT Treatment Interventions      PT Goals  (Current goals can be found in the Care Plan section)  Acute Rehab PT Goals PT Goal Formulation: All assessment and education complete, DC therapy    Frequency       Co-evaluation               AM-PAC PT "6 Clicks" Mobility  Outcome Measure Help needed turning from your back to your side while in a flat bed without using bedrails?: None Help needed moving from lying on your back to sitting on the side of a flat bed without using bedrails?: None Help needed moving to and from a bed to a chair (including a wheelchair)?: None Help needed standing up from a chair using your arms (e.g., wheelchair or bedside chair)?: None Help needed to walk in hospital room?: A Little Help needed climbing 3-5 steps with a railing? : A Little 6 Click Score: 22    End of Session Equipment Utilized During Treatment: Other (comment) (RLE KAFO) Activity Tolerance: Patient tolerated treatment well Patient left: in chair;with call bell/phone within reach Nurse Communication: Mobility status PT Visit Diagnosis: Other abnormalities of gait and mobility (R26.89)    Time: 1610-9604 PT Time Calculation (min) (ACUTE ONLY): 21 min   Charges:   PT Evaluation $PT Eval Low Complexity: 1 Low   PT General Charges $$ ACUTE PT VISIT: 1 Visit         Merryl Hacker, PT Acute Rehabilitation Services Office: [phone]   Enedina Finner  11/05/2022, 12:54 PM

## 2022-11-06 DIAGNOSIS — M869 Osteomyelitis, unspecified: Secondary | ICD-10-CM | POA: Diagnosis not present

## 2022-11-08 DIAGNOSIS — M869 Osteomyelitis, unspecified: Secondary | ICD-10-CM | POA: Diagnosis not present

## 2022-11-09 ENCOUNTER — Ambulatory Visit: Payer: Medicare Other | Admitting: Physician Assistant

## 2022-11-09 DIAGNOSIS — M869 Osteomyelitis, unspecified: Secondary | ICD-10-CM | POA: Diagnosis not present

## 2022-11-09 DIAGNOSIS — C8213 Follicular lymphoma grade II, intra-abdominal lymph nodes: Secondary | ICD-10-CM | POA: Diagnosis not present

## 2022-11-09 DIAGNOSIS — K5730 Diverticulosis of large intestine without perforation or abscess without bleeding: Secondary | ICD-10-CM | POA: Diagnosis not present

## 2022-11-09 DIAGNOSIS — I700 Atherosclerosis of aorta: Secondary | ICD-10-CM | POA: Diagnosis not present

## 2022-11-09 DIAGNOSIS — C8290 Follicular lymphoma, unspecified, unspecified site: Secondary | ICD-10-CM | POA: Diagnosis not present

## 2022-11-09 LAB — COMPREHENSIVE METABOLIC PANEL
Albumin: 3.9 (ref 3.5–5.0)
Calcium: 9.4 (ref 8.7–10.7)

## 2022-11-09 LAB — HEPATIC FUNCTION PANEL
ALT: 14 U/L (ref 7–35)
AST: 33 (ref 13–35)
Alkaline Phosphatase: 73 (ref 25–125)
Bilirubin, Total: 0.2

## 2022-11-09 LAB — BASIC METABOLIC PANEL
BUN: 17 (ref 4–21)
CO2: 31 — AB (ref 13–22)
Chloride: 104 (ref 99–108)
Creatinine: 0.9 (ref 0.5–1.1)
Glucose: 116
Potassium: 4.6 mEq/L (ref 3.5–5.1)
Sodium: 141 (ref 137–147)

## 2022-11-09 LAB — CBC AND DIFFERENTIAL
HCT: 33 — AB (ref 36–46)
Hemoglobin: 11.3 — AB (ref 12.0–16.0)
Neutrophils Absolute: 5.54
Platelets: 291 10*3/uL (ref 150–400)
WBC: 7.7

## 2022-11-09 LAB — CBC: RBC: 3.84 — AB (ref 3.87–5.11)

## 2022-11-09 NOTE — Progress Notes (Signed)
Patient is a pleasant 89 year old female recently admitted for 2-day hospitalization for osteomyelitis and surrounding cellulitis of anterior RLE who presents to clinic for follow-up.  She had seen Dr. Ulice Bold for consult 11/02/2022 to evaluate her chronic right lower extremity wound.  It had been getting progressively more swollen and erythematous.  Given concern for bone infection, she discussed with orthopedics and advised that she go to the hospital for IV antibiotics.  Upon her discharge, plan was for follow-up with infectious disease team for ongoing antibiotic management.  While admitted, she was evaluated by Dr. Carola Frost, orthopedics, who agreed with IV antibiotics as initial approach to her right tibial osteomyelitis.  If it fails, could then proceed with surgical debridement versus amputation.  Today, patient is accompanied by her son at bedside.  They are quite pleased with the improvement from her right lower extremity wound.  Wound care has been changing dressings at home every other day.  Adaptic followed by a bordered Mepilex dressing.  She tells me that she did not have any significant leg swelling or discomfort until the weeks leading up to seeing Dr. Ulice Bold here in clinic.  Since then, she reports that the redness has improved considerably, but still has some residual swelling.  On exam, she still has small amount of pitting edema.  However, her cellulitis appears considerably improved compared to images obtained while in the ED.  Remarkably, her wound has also responded quite nicely.  No significant wound or drainage noted.  Pedal pulse difficult to appreciate, but extremity is warm and good capillary refill distally.  Distal sensation also intact.  Chronic impaired ROM.  Continue with IV antibiotics per infectious disease.  It appears to be responding quite well to IV antibiotics, but if it were to regress she reports that she may require bony excision given MRI findings.  However,  they are understandably optimistic.  Will remain available as needed, but will defer primarily to ID and orthopedics.  No specific follow-up needed.  Picture(s) obtained of the patient and placed in the chart were with the patient's or guardian's permission.

## 2022-11-10 ENCOUNTER — Other Ambulatory Visit: Payer: Self-pay | Admitting: Cardiology

## 2022-11-10 DIAGNOSIS — M86171 Other acute osteomyelitis, right ankle and foot: Secondary | ICD-10-CM | POA: Diagnosis not present

## 2022-11-10 DIAGNOSIS — M869 Osteomyelitis, unspecified: Secondary | ICD-10-CM | POA: Diagnosis not present

## 2022-11-11 ENCOUNTER — Encounter: Payer: Self-pay | Admitting: Oncology

## 2022-11-11 ENCOUNTER — Inpatient Hospital Stay: Payer: Medicare Other | Attending: Oncology | Admitting: Oncology

## 2022-11-11 ENCOUNTER — Telehealth: Payer: Self-pay | Admitting: Oncology

## 2022-11-11 VITALS — BP 123/70 | HR 65 | Temp 98.5°F | Resp 18 | Ht 60.0 in | Wt 114.4 lb

## 2022-11-11 DIAGNOSIS — C8213 Follicular lymphoma grade II, intra-abdominal lymph nodes: Secondary | ICD-10-CM

## 2022-11-11 DIAGNOSIS — M869 Osteomyelitis, unspecified: Secondary | ICD-10-CM | POA: Diagnosis not present

## 2022-11-11 NOTE — Progress Notes (Signed)
[facility]  [address] [place],  [place]  [address] [phone]  Clinic Day: 11/11/2022  Referring physician: [name], MD  ASSESSMENT & PLAN:  Assessment & Plan: Follicular lymphoma grade II of intra-abdominal lymph nodes (HCC) CT chest, abdomen and pelvis in July 2022 revealed with stable lymphadenopathy, but a slight increase in the pulmonary nodules. We decided against further evaluation with PET. CT imaging from January 2023 is stable to slightly improved. Repeat CT from July 2023 is stable with decrease in the left peri-aortic node, but with mild increase in the lung nodules.   Multiple Lung Nodules I suspect these are also lymphoma but we have not biopsied them to prove it. At least 3 are slowly but steadily enlarging consistent with progression of her lymphoma. We have held off on treatment due to potential toxicities and she still remains largely asymptomatic from these lesions. We will let her heal from her osteomyelitis and think about options. We could biopsy one of these lesions but there is always the risks of complications. We can consider treatment with something fairly mild, but there still would be a risk of toxicities. For now we have decided on closer surveillance and she and her son can think over how aggressive they wish to be with her comorbidities and age over 23. I will see her back in 3 months if she still is asymptomatic from the lung, we may consider repeating scans 6 months from now.   Non-healing wound of anterior left ankle She has a history of multiple surgeries and skin grafting years ago but now has a persistent draining wound. She eventually went to the wound center and required admission to the hospital with findings of osteomyelitis, she is on IV antibiotics and will be treated for a prolonged period of time.   Plan: She was admitted through the ED on 11/02/2022 for further evaluation and was found to have  osteomyelitis and surrounding cellulitis of anterior right lower extremity. She was treated with IV antibiotics during her stay and discharged with a PICC line for further IV antibiotics. Her son informed me that he has administered a 6th dose. She had a CT chest, abdomen, and pelvis done on 11/09/2022 that revealed interval growth of bilateral pulmonary masses and nodules that favor progression of pulmonary lymphoma. Mild high left para-aortic adenopathy is mildly decreased with no new sites of lymphoma in the chest, abdomen, or pelvis, there is however, a lobulated 2.5 cm posterior pancreatic tail cystic lesion with suggestion of thin internal septation that is increased in size and cannot exclude cystic pancreatic neoplasm. The lesion of her right lower lung has increased from 1.6 cm from July, 2022 to 3.2 cm in July, 2023, and now 4.3 cm. The right upper lung nodule has increased from 2.5 cm in July, 2022 to 2.8 cm in July, 2023 to currently 3.4 cm. The left lower lung nodule has increased from 1.3 cm in July, 2022 to 1.5 cm July, 2023 to 1.7 cm now.   I believe these nodules in her lungs are lymphoma, and informed her that there are different forms of treatment options in the form of pills that we can discuss after her infection is cleared. Since she is healing very slowly I think she has another 1-2 months of healing left.  There is no urgency since this has been a slow growth and she is asymptomatic. She reminded me that she had a stroke on the right side of  her head that effected her eye sight, R>L. Her WBC is 7.7, hemoglobin is low at 11.3, and platelet count is 291,000 as of today. Her CMP is normal. I will see her back in 3 months with CBC and CMP. The patient and her son understand the plans discussed today and are in agreement with them.  They know to contact our office if she develops concerns prior to her next appointment.   I provided 40 minutes of face-to-face time during this this encounter and  > 50% was spent counseling as documented under my assessment and plan.    Dellia Beckwith, MD  [facility] [address] [place] [address] Dept: [phone] Dept Fax: [phone]     CHIEF COMPLAINT:  CC: Follicular lymphoma Grade II follicular of intra-abdominal lymph nodes (HCC)  Current Treatment:  Observation  HISTORY OF PRESENT ILLNESS:   Oncology History  Follicular lymphoma grade II of intra-abdominal lymph nodes (HCC)  11/13/2019 Initial Diagnosis   Follicular lymphoma grade II of intra-abdominal lymph nodes (HCC)   12/14/2019 Cancer Staging   Staging form: Hodgkin and Non-Hodgkin Lymphoma, AJCC 8th Edition - Clinical stage from 12/14/2019: Stage I (Follicular lymphoma) - Signed by Dellia Beckwith, MD on 01/31/2020      INTERVAL HISTORY:  Julia Hunt is here for routine follow up for grade 2 follicular lymphoma of intra-abdominal lymph nodes (HCC). Patient states that she feels ok and complains of constant right foot pain. She informed me that she has been having problems and pain with her foot. She was admitted through the ED on 11/02/2022 for further evaluation and was found to have osteomyelitis and surrounding cellulitis of anterior right lower extremity. She was treated with IV antibiotics during her stay and discharged with a PICC line for further IV antibiotics. Her son informed me that he has administered a 6th dose. She had a CT chest, abdomen, and pelvis done on 11/09/2022 that revealed interval growth of bilateral pulmonary masses and nodules that favor progression of pulmonary lymphoma. Mild high left para-aortic adenopathy is mildly decreased with no new sites of lymphoma in the chest, abdomen, or pelvis, there is however, a lobulated 2.5cm posterior pancreatic tail cystic lesion with suggestion of thin internal septation that is increased in size and cannot exclude cystic pancreatic neoplasm.  The lesion of her right lower lung has increased from 1.6 cm from July, 2022 to 3.2 cm in July, 2023, and now 4.3 cm. The right upper lung nodule has increased from 2.5 cm in July, 2022 to 2.8 cm in July, 2023 to currently 3.4 cm. The left lower lung nodule has increased from 1.3 cm in July, 2022 to 1.5 cm July, 2023 to 1.7 cm now.  I believe these nodules in her lungs are lymphoma and informed her that there are different forms of treatment options in the form of pills that we can discuss after her infection is cleared.  There is no urgency since this has been a slow growth and she is asymptomatic.  Since she is healing very slowly I think she has another 1-2 months of healing left. She reminded me that she had a stroke on the right side of her head that effected her eye sight, R>L. Her WBC is 7.7, hemoglobin is low at 11.3, and platelet count is 291,000 as of today. Her CMP is normal. I will see her back in 3 months with CBC and CMP.   She denies  signs of infection such as sore throat, sinus drainage, cough, or urinary symptoms.  She denies fevers or recurrent chills. She denies pain. She denies nausea, vomiting, chest pain, dyspnea or cough. Her appetite is good and her weight has increased 1 pounds over last 9 days . She is accompanied with her son at today's visit.   REVIEW OF SYSTEMS:  Review of Systems  Constitutional:  Positive for fatigue. Negative for appetite change, chills, diaphoresis, fever and unexpected weight change.  HENT:   Positive for hearing loss and voice change (hoarsness). Negative for lump/mass, mouth sores, nosebleeds, sore throat, tinnitus and trouble swallowing.   Eyes:  Positive for eye problems. Negative for icterus.  Respiratory: Negative.  Negative for chest tightness, cough, hemoptysis, shortness of breath and wheezing.   Cardiovascular: Negative.  Negative for chest pain, leg swelling and palpitations.  Gastrointestinal: Negative.  Negative for abdominal distention,  abdominal pain, blood in stool, constipation, diarrhea, nausea, rectal pain and vomiting.  Endocrine: Negative.   Genitourinary: Negative.  Negative for bladder incontinence, difficulty urinating, dyspareunia, dysuria, frequency, hematuria, menstrual problem, nocturia, pelvic pain, vaginal bleeding and vaginal discharge.   Musculoskeletal:  Positive for back pain (Chronic) and neck stiffness. Negative for arthralgias, flank pain, gait problem, myalgias and neck pain.       Pain in the right foot with diagnosis of osteomyelitis.   Skin: Negative.  Negative for itching, rash and wound.  Neurological:  Positive for numbness (first two fingers of the right hand). Negative for dizziness, extremity weakness, gait problem, headaches, light-headedness, seizures and speech difficulty.  Hematological: Negative.  Negative for adenopathy. Does not bruise/bleed easily.  Psychiatric/Behavioral:  Positive for confusion (Decreased memory). Negative for decreased concentration, depression, sleep disturbance and suicidal ideas. The patient is not nervous/anxious.      VITALS:  Blood pressure 123/70, pulse 65, temperature 98.5 F (36.9 C), temperature source Oral, resp. rate 18, height 5' (1.524 m), weight 114 lb 6.4 oz (51.9 kg), SpO2 96%.  Wt Readings from Last 3 Encounters:  11/11/22 114 lb 6.4 oz (51.9 kg)  11/05/22 113 lb 1.5 oz (51.3 kg)  11/02/22 113 lb 3.2 oz (51.3 kg)    Body mass index is 22.34 kg/m.  Performance status (ECOG): 1 - Symptomatic but completely ambulatory  PHYSICAL EXAM:  Physical Exam Vitals and nursing note reviewed.  Constitutional:      General: She is not in acute distress.    Appearance: Normal appearance. She is normal weight. She is not ill-appearing, toxic-appearing or diaphoretic.  HENT:     Head: Normocephalic and atraumatic.     Right Ear: Tympanic membrane, ear canal and external ear normal. There is no impacted cerumen.     Left Ear: Tympanic membrane, ear canal  and external ear normal. There is no impacted cerumen.     Nose: Nose normal. No congestion or rhinorrhea.     Mouth/Throat:     Mouth: Mucous membranes are moist.     Pharynx: Oropharynx is clear. No oropharyngeal exudate or posterior oropharyngeal erythema.  Eyes:     General: No scleral icterus.       Right eye: No discharge.        Left eye: No discharge.     Extraocular Movements: Extraocular movements intact.     Conjunctiva/sclera: Conjunctivae normal.     Pupils: Pupils are equal, round, and reactive to light.  Neck:     Vascular: No carotid bruit.  Cardiovascular:     Rate and Rhythm:  Normal rate and regular rhythm.     Pulses: Normal pulses.     Heart sounds: Normal heart sounds. No murmur heard.    No friction rub. No gallop.  Pulmonary:     Effort: Pulmonary effort is normal. No respiratory distress.     Breath sounds: Normal breath sounds. No stridor. No wheezing, rhonchi or rales.  Chest:     Chest wall: No tenderness.     Comments: Pacemaker in her left upper chest.  Abdominal:     General: Bowel sounds are normal. There is no distension.     Palpations: Abdomen is soft. There is no hepatomegaly, splenomegaly or mass.     Tenderness: There is no abdominal tenderness. There is no right CVA tenderness, left CVA tenderness, guarding or rebound.     Hernia: No hernia is present.  Musculoskeletal:        General: No swelling, tenderness, deformity or signs of injury. Normal range of motion.     Cervical back: Normal range of motion and neck supple. No rigidity or tenderness.     Right lower leg: No edema.     Left lower leg: No edema.     Comments: Brace of the right leg with atrophy of both legs, left greater than right  Feet:     Comments: Right foot wound is bandaged.  Lymphadenopathy:     Cervical: No cervical adenopathy.     Right cervical: No superficial, deep or posterior cervical adenopathy.    Left cervical: No superficial, deep or posterior cervical  adenopathy.     Upper Body:     Right upper body: No supraclavicular, axillary or pectoral adenopathy.     Left upper body: No supraclavicular, axillary or pectoral adenopathy.  Skin:    General: Skin is warm and dry.     Coloration: Skin is not jaundiced or pale.     Findings: No bruising, erythema, lesion or rash.  Neurological:     General: No focal deficit present.     Mental Status: She is alert and oriented to person, place, and time. Mental status is at baseline.     Cranial Nerves: No cranial nerve deficit.     Sensory: No sensory deficit.     Motor: No weakness.     Coordination: Coordination normal.     Gait: Gait normal.     Deep Tendon Reflexes: Reflexes normal.  Psychiatric:        Mood and Affect: Mood normal.        Behavior: Behavior normal.        Thought Content: Thought content normal.        Judgment: Judgment normal.     LABS:      Latest Ref Rng & Units 11/09/2022   12:00 AM 11/05/2022    3:37 AM 11/04/2022   12:52 AM  CBC  WBC  7.7     6.2  6.3   Hemoglobin 12.0 - 16.0 11.3     10.0  9.8   Hematocrit 36 - 46 33     31.3  30.3   Platelets 150 - 400 K/uL 291     187  173      This result is from an external source.      Latest Ref Rng & Units 11/09/2022   12:00 AM 11/04/2022   12:52 AM 11/03/2022   10:36 AM  CMP  Glucose 70 - 99 mg/dL  95  409   BUN 4 -  21 17     17  16    Creatinine 0.5 - 1.1 0.9     0.75  0.86   Sodium 137 - 147 141     143  138   Potassium 3.5 - 5.1 mEq/L 4.6     3.9  3.7   Chloride 99 - 108 104     107  103   CO2 13 - 22 31     24  22    Calcium 8.7 - 10.7 9.4     8.6  8.9   Total Protein 6.5 - 8.1 g/dL   6.1   Total Bilirubin 0.3 - 1.2 mg/dL   0.3   Alkaline Phos 25 - 125 73      65   AST 13 - 35 33      19   ALT 7 - 35 U/L 14      16      This result is from an external source.     Lab Results  Component Value Date   CEA1 5.3 (H) 10/01/2020   /  CEA  Date Value Ref Range Status  10/01/2020 5.3 (H) 0.0 - 4.7 ng/mL  Final    Comment:    (NOTE)                             Nonsmokers          <3.9                             Smokers             <5.6 [employer] Electrochemiluminescence Immunoassay (ECLIA) Values obtained with different assay methods or kits cannot be used interchangeably.  Results cannot be interpreted as absolute evidence of the presence or absence of malignant disease. Performed At: [facility] [address] [place], [place] [id number] Jolene Schimke MD OZ:[id number]     Lab Results  Component Value Date   LDH 181 07/16/2021   LDH 175 01/14/2021   LDH 549 10/01/2020    STUDIES:  Exam: 11/09/2022 CT Chest, Abdomen, and Pelvis with Contrast Impression: Interval growth of bilateral pulmonary masses and nodules as detailed, favor progression of pulmonary lymphoma. Mild high left para-aortic adenopathy is mildly decreased. No new sites of lymphoma in the chest, abdomen, or pelvis. Lobulated 2.5cm posterior pancreatic tail cystic lesion with suggestion of thin internal septation, increased in size, cannot exclude cystic pancreatic neoplasm. No biliary or pancreatic duct dilation. GI consultation for consideration of EUS/FNA may be considered as clinically warranted given patient comorbidities. Patient is presumably not a candidate for MRI given pacemaker.  Marked sigmoid diverticulosis. Chronic Thoracolumbar vertebral compression fractures as detailed. Aortic Atherosclerosis (ICD10-170.0)   HISTORY:   Allergies:  Allergies  Allergen Reactions   Other Other (See Comments)    Pt states she is not allergic.   Clarithromycin Other (See Comments)    Pt states she is not allergic.   Sulfa Antibiotics Other (See Comments)    Pt states she is not allergic.    Current Medications: Current Outpatient Medications  Medication Sig Dispense Refill   cefTRIAXone (ROCEPHIN) 2 g injection      Calcium Carbonate-Vitamin D 600-400 MG-UNIT tablet Take 1 tablet by mouth  daily.     cefadroxil (DURICEF) 500 MG capsule Take 1 capsule (500 mg total) by mouth 2 (two) times daily. 60  capsule 0   levothyroxine (SYNTHROID) 88 MCG tablet Take 88 mcg by mouth daily. Rotates it with every other day     menthol-cetylpyridinium (CEPACOL) 3 MG lozenge Take 1 lozenge (3 mg total) by mouth as needed for sore throat. 100 tablet 12   metoprolol succinate (TOPROL-XL) 50 MG 24 hr tablet TAKE 1 TABLET BY MOUTH DAILY. TAKE WITH OR IMMEDIATELY FOLLOWING A MEAL. 90 tablet 3   Multiple Vitamin (MULTIVITAMIN) capsule Take 1 capsule by mouth daily.     omeprazole (PRILOSEC) 20 MG capsule Take 20 mg by mouth as needed (heartburn).     sertraline (ZOLOFT) 100 MG tablet Take 100 mg by mouth daily.     traMADol (ULTRAM) 50 MG tablet Take 50 mg by mouth daily.   0   No current facility-administered medications for this visit.    I,Jasmine M Lassiter,acting as a scribe for Dellia Beckwith, MD.,have documented all relevant documentation on the behalf of Dellia Beckwith, MD,as directed by  Dellia Beckwith, MD while in the presence of Dellia Beckwith, MD.   I have reviewed this report as typed by the medical scribe, and it is complete and accurate.

## 2022-11-11 NOTE — Telephone Encounter (Signed)
11/11/22 Spoke with patient and confirmed next appt

## 2022-11-17 ENCOUNTER — Other Ambulatory Visit: Payer: Self-pay

## 2022-11-17 ENCOUNTER — Ambulatory Visit: Payer: Medicare Other | Admitting: Infectious Diseases

## 2022-11-17 ENCOUNTER — Telehealth: Payer: Self-pay

## 2022-11-17 ENCOUNTER — Encounter: Payer: Self-pay | Admitting: Infectious Diseases

## 2022-11-17 VITALS — BP 133/70 | HR 67 | Temp 98.2°F | Resp 16

## 2022-11-17 DIAGNOSIS — Z452 Encounter for adjustment and management of vascular access device: Secondary | ICD-10-CM

## 2022-11-17 DIAGNOSIS — M7989 Other specified soft tissue disorders: Secondary | ICD-10-CM | POA: Diagnosis not present

## 2022-11-17 DIAGNOSIS — M868X6 Other osteomyelitis, lower leg: Secondary | ICD-10-CM

## 2022-11-17 DIAGNOSIS — S81801A Unspecified open wound, right lower leg, initial encounter: Secondary | ICD-10-CM

## 2022-11-17 DIAGNOSIS — Z792 Long term (current) use of antibiotics: Secondary | ICD-10-CM | POA: Diagnosis not present

## 2022-11-17 DIAGNOSIS — M86171 Other acute osteomyelitis, right ankle and foot: Secondary | ICD-10-CM | POA: Diagnosis not present

## 2022-11-17 DIAGNOSIS — M869 Osteomyelitis, unspecified: Secondary | ICD-10-CM | POA: Diagnosis not present

## 2022-11-17 MED ORDER — CEFADROXIL 500 MG PO CAPS
500.0000 mg | ORAL_CAPSULE | Freq: Two times a day (BID) | ORAL | 0 refills | Status: DC
Start: 2022-11-20 — End: 2022-12-14
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
## 2022-11-17 NOTE — Assessment & Plan Note (Signed)
Site unremarkable, well maintained and functioning as expected. Continue care and maintenance until planned end of IV antibiotics. Home Health team to remove at completion (last dose 8/23).

## 2022-11-17 NOTE — Assessment & Plan Note (Signed)
I think she needs some stronger compression stockings to manage fluid so tissue can decongest in hopes of healing over. We discussed different products and provided recommendation to try 10-20 mmHg, may need stronger but start here. Careful walking w/o shoes to prevent falls.  Discussed this will likely be the biggest consideration as to whether the wound heals over or not.  Following with [place] wound clinic.

## 2022-11-17 NOTE — Patient Instructions (Addendum)
For the compression stockings I think you will need at least 10-20 mmHg grade compression to help get the swelling out.  There are special stocking donner's available to help get them on if needed.  Prop the leg up several times a day to also help the swelling.   If we can get the swelling out the wound will have a much easier time to heal.   Will plan to start cefadroxil 1000 mg (2 tablets) twice a day for 4 more weeks trough September 23rd.   12/14/2022 - next appointment with Dr. Drue Second to check in.

## 2022-11-17 NOTE — Assessment & Plan Note (Signed)
Chronic wound with possible early findings of osteomyelitis described on MRI regarding contiguous fibula bone. Wound cultures revealed MSSA (R-clindamycin) however this may not be the complete picture as her erythematous reaction was c/m with strep infection. Mainly collected to verify we are good to defer MRSA coverage, which we are. She is responding nicely to ceftriaxone once daily.  She will continue to administer injections through 8/23. Transition to oral cefadroxil Saturday 8/24 500 mg BID (renal dose) for another 30d.  12/14/2022 with Dr. Drue Second arranged.   Agree with deferring oncology treatment until her infection is under better control.

## 2022-11-17 NOTE — Assessment & Plan Note (Signed)
All OPAT lab work reviewed and within normal limits.  Slight reduction in CRP (2.2 >> 1.9 mg/L).

## 2022-11-17 NOTE — Telephone Encounter (Signed)
Per Judeth Cornfield Dixon,NP - Pull PICC line after last dose of IV ceftriaxone on 11/19/2022. Message sent to Jeri Modena, RN/[employer].    Che Rachal Lesli Albee, CMA

## 2022-11-20 DIAGNOSIS — M869 Osteomyelitis, unspecified: Secondary | ICD-10-CM | POA: Diagnosis not present

## 2022-11-23 ENCOUNTER — Ambulatory Visit (INDEPENDENT_AMBULATORY_CARE_PROVIDER_SITE_OTHER): Payer: Medicare Other

## 2022-11-23 DIAGNOSIS — R55 Syncope and collapse: Secondary | ICD-10-CM

## 2022-11-23 LAB — CUP PACEART REMOTE DEVICE CHECK
Battery Remaining Longevity: 50 mo
Battery Remaining Percentage: 46 %
Battery Voltage: 2.98 V
Brady Statistic AP VP Percent: 55 %
Brady Statistic AP VS Percent: 7.5 %
Brady Statistic AS VP Percent: 5.9 %
Brady Statistic AS VS Percent: 31 %
Brady Statistic RA Percent Paced: 62 %
Brady Statistic RV Percent Paced: 61 %
Date Time Interrogation Session: 20240827020014
Implantable Lead Connection Status: 753985
Implantable Lead Connection Status: 753985
Implantable Lead Implant Date: 20190205
Implantable Lead Implant Date: 20190205
Implantable Lead Location: 753859
Implantable Lead Location: 753860
Implantable Pulse Generator Implant Date: 20190205
Lead Channel Impedance Value: 350 Ohm
Lead Channel Impedance Value: 530 Ohm
Lead Channel Pacing Threshold Amplitude: 0.5 V
Lead Channel Pacing Threshold Amplitude: 0.625 V
Lead Channel Pacing Threshold Pulse Width: 0.5 ms
Lead Channel Pacing Threshold Pulse Width: 0.5 ms
Lead Channel Sensing Intrinsic Amplitude: 2.1 mV
Lead Channel Sensing Intrinsic Amplitude: 6.8 mV
Lead Channel Setting Pacing Amplitude: 0.875
Lead Channel Setting Pacing Amplitude: 2 V
Lead Channel Setting Pacing Pulse Width: 0.5 ms
Lead Channel Setting Sensing Sensitivity: 2 mV
Pulse Gen Model: 2272
Pulse Gen Serial Number: 8993229

## 2022-12-02 DIAGNOSIS — E039 Hypothyroidism, unspecified: Secondary | ICD-10-CM | POA: Diagnosis not present

## 2022-12-02 DIAGNOSIS — M869 Osteomyelitis, unspecified: Secondary | ICD-10-CM | POA: Diagnosis not present

## 2022-12-06 DIAGNOSIS — M8008XD Age-related osteoporosis with current pathological fracture, vertebra(e), subsequent encounter for fracture with routine healing: Secondary | ICD-10-CM | POA: Diagnosis not present

## 2022-12-06 DIAGNOSIS — B9561 Methicillin susceptible Staphylococcus aureus infection as the cause of diseases classified elsewhere: Secondary | ICD-10-CM | POA: Diagnosis not present

## 2022-12-06 DIAGNOSIS — M47812 Spondylosis without myelopathy or radiculopathy, cervical region: Secondary | ICD-10-CM | POA: Diagnosis not present

## 2022-12-06 DIAGNOSIS — H5462 Unqualified visual loss, left eye, normal vision right eye: Secondary | ICD-10-CM | POA: Diagnosis not present

## 2022-12-06 DIAGNOSIS — M21371 Foot drop, right foot: Secondary | ICD-10-CM | POA: Diagnosis not present

## 2022-12-06 DIAGNOSIS — M62561 Muscle wasting and atrophy, not elsewhere classified, right lower leg: Secondary | ICD-10-CM | POA: Diagnosis not present

## 2022-12-06 DIAGNOSIS — C8213 Follicular lymphoma grade II, intra-abdominal lymph nodes: Secondary | ICD-10-CM | POA: Diagnosis not present

## 2022-12-06 DIAGNOSIS — I119 Hypertensive heart disease without heart failure: Secondary | ICD-10-CM | POA: Diagnosis not present

## 2022-12-06 DIAGNOSIS — M1811 Unilateral primary osteoarthritis of first carpometacarpal joint, right hand: Secondary | ICD-10-CM | POA: Diagnosis not present

## 2022-12-06 DIAGNOSIS — L03115 Cellulitis of right lower limb: Secondary | ICD-10-CM | POA: Diagnosis not present

## 2022-12-06 DIAGNOSIS — L97511 Non-pressure chronic ulcer of other part of right foot limited to breakdown of skin: Secondary | ICD-10-CM | POA: Diagnosis not present

## 2022-12-06 DIAGNOSIS — M65342 Trigger finger, left ring finger: Secondary | ICD-10-CM | POA: Diagnosis not present

## 2022-12-06 DIAGNOSIS — I452 Bifascicular block: Secondary | ICD-10-CM | POA: Diagnosis not present

## 2022-12-06 DIAGNOSIS — I700 Atherosclerosis of aorta: Secondary | ICD-10-CM | POA: Diagnosis not present

## 2022-12-06 DIAGNOSIS — M25361 Other instability, right knee: Secondary | ICD-10-CM | POA: Diagnosis not present

## 2022-12-06 DIAGNOSIS — M65331 Trigger finger, right middle finger: Secondary | ICD-10-CM | POA: Diagnosis not present

## 2022-12-06 DIAGNOSIS — M869 Osteomyelitis, unspecified: Secondary | ICD-10-CM | POA: Diagnosis not present

## 2022-12-06 DIAGNOSIS — I69398 Other sequelae of cerebral infarction: Secondary | ICD-10-CM | POA: Diagnosis not present

## 2022-12-06 DIAGNOSIS — M47817 Spondylosis without myelopathy or radiculopathy, lumbosacral region: Secondary | ICD-10-CM | POA: Diagnosis not present

## 2022-12-06 DIAGNOSIS — M47814 Spondylosis without myelopathy or radiculopathy, thoracic region: Secondary | ICD-10-CM | POA: Diagnosis not present

## 2022-12-06 DIAGNOSIS — G5603 Carpal tunnel syndrome, bilateral upper limbs: Secondary | ICD-10-CM | POA: Diagnosis not present

## 2022-12-06 DIAGNOSIS — M5030 Other cervical disc degeneration, unspecified cervical region: Secondary | ICD-10-CM | POA: Diagnosis not present

## 2022-12-06 DIAGNOSIS — M5137 Other intervertebral disc degeneration, lumbosacral region: Secondary | ICD-10-CM | POA: Diagnosis not present

## 2022-12-06 DIAGNOSIS — M62562 Muscle wasting and atrophy, not elsewhere classified, left lower leg: Secondary | ICD-10-CM | POA: Diagnosis not present

## 2022-12-06 NOTE — Progress Notes (Signed)
Remote pacemaker transmission.   

## 2022-12-07 ENCOUNTER — Ambulatory Visit: Payer: Medicare Other | Admitting: Plastic Surgery

## 2022-12-08 DIAGNOSIS — H04123 Dry eye syndrome of bilateral lacrimal glands: Secondary | ICD-10-CM | POA: Diagnosis not present

## 2022-12-08 DIAGNOSIS — H47011 Ischemic optic neuropathy, right eye: Secondary | ICD-10-CM | POA: Diagnosis not present

## 2022-12-10 DIAGNOSIS — M47817 Spondylosis without myelopathy or radiculopathy, lumbosacral region: Secondary | ICD-10-CM | POA: Diagnosis not present

## 2022-12-10 DIAGNOSIS — B9561 Methicillin susceptible Staphylococcus aureus infection as the cause of diseases classified elsewhere: Secondary | ICD-10-CM | POA: Diagnosis not present

## 2022-12-10 DIAGNOSIS — M62562 Muscle wasting and atrophy, not elsewhere classified, left lower leg: Secondary | ICD-10-CM | POA: Diagnosis not present

## 2022-12-10 DIAGNOSIS — M5030 Other cervical disc degeneration, unspecified cervical region: Secondary | ICD-10-CM | POA: Diagnosis not present

## 2022-12-10 DIAGNOSIS — M62561 Muscle wasting and atrophy, not elsewhere classified, right lower leg: Secondary | ICD-10-CM | POA: Diagnosis not present

## 2022-12-10 DIAGNOSIS — L03115 Cellulitis of right lower limb: Secondary | ICD-10-CM | POA: Diagnosis not present

## 2022-12-10 DIAGNOSIS — M21371 Foot drop, right foot: Secondary | ICD-10-CM | POA: Diagnosis not present

## 2022-12-10 DIAGNOSIS — M47812 Spondylosis without myelopathy or radiculopathy, cervical region: Secondary | ICD-10-CM | POA: Diagnosis not present

## 2022-12-10 DIAGNOSIS — M65331 Trigger finger, right middle finger: Secondary | ICD-10-CM | POA: Diagnosis not present

## 2022-12-10 DIAGNOSIS — M8008XD Age-related osteoporosis with current pathological fracture, vertebra(e), subsequent encounter for fracture with routine healing: Secondary | ICD-10-CM | POA: Diagnosis not present

## 2022-12-10 DIAGNOSIS — C8213 Follicular lymphoma grade II, intra-abdominal lymph nodes: Secondary | ICD-10-CM | POA: Diagnosis not present

## 2022-12-10 DIAGNOSIS — I119 Hypertensive heart disease without heart failure: Secondary | ICD-10-CM | POA: Diagnosis not present

## 2022-12-10 DIAGNOSIS — I452 Bifascicular block: Secondary | ICD-10-CM | POA: Diagnosis not present

## 2022-12-10 DIAGNOSIS — M5137 Other intervertebral disc degeneration, lumbosacral region: Secondary | ICD-10-CM | POA: Diagnosis not present

## 2022-12-10 DIAGNOSIS — H5462 Unqualified visual loss, left eye, normal vision right eye: Secondary | ICD-10-CM | POA: Diagnosis not present

## 2022-12-10 DIAGNOSIS — I700 Atherosclerosis of aorta: Secondary | ICD-10-CM | POA: Diagnosis not present

## 2022-12-10 DIAGNOSIS — M1811 Unilateral primary osteoarthritis of first carpometacarpal joint, right hand: Secondary | ICD-10-CM | POA: Diagnosis not present

## 2022-12-10 DIAGNOSIS — L97511 Non-pressure chronic ulcer of other part of right foot limited to breakdown of skin: Secondary | ICD-10-CM | POA: Diagnosis not present

## 2022-12-10 DIAGNOSIS — G5603 Carpal tunnel syndrome, bilateral upper limbs: Secondary | ICD-10-CM | POA: Diagnosis not present

## 2022-12-10 DIAGNOSIS — M25361 Other instability, right knee: Secondary | ICD-10-CM | POA: Diagnosis not present

## 2022-12-10 DIAGNOSIS — M47814 Spondylosis without myelopathy or radiculopathy, thoracic region: Secondary | ICD-10-CM | POA: Diagnosis not present

## 2022-12-10 DIAGNOSIS — M869 Osteomyelitis, unspecified: Secondary | ICD-10-CM | POA: Diagnosis not present

## 2022-12-10 DIAGNOSIS — I69398 Other sequelae of cerebral infarction: Secondary | ICD-10-CM | POA: Diagnosis not present

## 2022-12-13 DIAGNOSIS — I452 Bifascicular block: Secondary | ICD-10-CM | POA: Diagnosis not present

## 2022-12-13 DIAGNOSIS — L03115 Cellulitis of right lower limb: Secondary | ICD-10-CM | POA: Diagnosis not present

## 2022-12-13 DIAGNOSIS — M21371 Foot drop, right foot: Secondary | ICD-10-CM | POA: Diagnosis not present

## 2022-12-13 DIAGNOSIS — M47817 Spondylosis without myelopathy or radiculopathy, lumbosacral region: Secondary | ICD-10-CM | POA: Diagnosis not present

## 2022-12-13 DIAGNOSIS — M62561 Muscle wasting and atrophy, not elsewhere classified, right lower leg: Secondary | ICD-10-CM | POA: Diagnosis not present

## 2022-12-13 DIAGNOSIS — L97511 Non-pressure chronic ulcer of other part of right foot limited to breakdown of skin: Secondary | ICD-10-CM | POA: Diagnosis not present

## 2022-12-13 DIAGNOSIS — H5462 Unqualified visual loss, left eye, normal vision right eye: Secondary | ICD-10-CM | POA: Diagnosis not present

## 2022-12-13 DIAGNOSIS — M25361 Other instability, right knee: Secondary | ICD-10-CM | POA: Diagnosis not present

## 2022-12-13 DIAGNOSIS — M8008XD Age-related osteoporosis with current pathological fracture, vertebra(e), subsequent encounter for fracture with routine healing: Secondary | ICD-10-CM | POA: Diagnosis not present

## 2022-12-13 DIAGNOSIS — C8213 Follicular lymphoma grade II, intra-abdominal lymph nodes: Secondary | ICD-10-CM | POA: Diagnosis not present

## 2022-12-13 DIAGNOSIS — I700 Atherosclerosis of aorta: Secondary | ICD-10-CM | POA: Diagnosis not present

## 2022-12-13 DIAGNOSIS — M869 Osteomyelitis, unspecified: Secondary | ICD-10-CM | POA: Diagnosis not present

## 2022-12-13 DIAGNOSIS — M47812 Spondylosis without myelopathy or radiculopathy, cervical region: Secondary | ICD-10-CM | POA: Diagnosis not present

## 2022-12-13 DIAGNOSIS — M47814 Spondylosis without myelopathy or radiculopathy, thoracic region: Secondary | ICD-10-CM | POA: Diagnosis not present

## 2022-12-13 DIAGNOSIS — I69398 Other sequelae of cerebral infarction: Secondary | ICD-10-CM | POA: Diagnosis not present

## 2022-12-13 DIAGNOSIS — M62562 Muscle wasting and atrophy, not elsewhere classified, left lower leg: Secondary | ICD-10-CM | POA: Diagnosis not present

## 2022-12-13 DIAGNOSIS — M65331 Trigger finger, right middle finger: Secondary | ICD-10-CM | POA: Diagnosis not present

## 2022-12-13 DIAGNOSIS — I119 Hypertensive heart disease without heart failure: Secondary | ICD-10-CM | POA: Diagnosis not present

## 2022-12-13 DIAGNOSIS — G5603 Carpal tunnel syndrome, bilateral upper limbs: Secondary | ICD-10-CM | POA: Diagnosis not present

## 2022-12-13 DIAGNOSIS — M5137 Other intervertebral disc degeneration, lumbosacral region: Secondary | ICD-10-CM | POA: Diagnosis not present

## 2022-12-13 DIAGNOSIS — M5030 Other cervical disc degeneration, unspecified cervical region: Secondary | ICD-10-CM | POA: Diagnosis not present

## 2022-12-13 DIAGNOSIS — M1811 Unilateral primary osteoarthritis of first carpometacarpal joint, right hand: Secondary | ICD-10-CM | POA: Diagnosis not present

## 2022-12-13 DIAGNOSIS — B9561 Methicillin susceptible Staphylococcus aureus infection as the cause of diseases classified elsewhere: Secondary | ICD-10-CM | POA: Diagnosis not present

## 2022-12-14 ENCOUNTER — Other Ambulatory Visit: Payer: Self-pay

## 2022-12-14 ENCOUNTER — Encounter: Payer: Self-pay | Admitting: Internal Medicine

## 2022-12-14 ENCOUNTER — Ambulatory Visit (INDEPENDENT_AMBULATORY_CARE_PROVIDER_SITE_OTHER): Payer: Medicare Other | Admitting: Internal Medicine

## 2022-12-14 VITALS — BP 139/66 | HR 62 | Temp 97.8°F | Ht 60.0 in | Wt 107.0 lb

## 2022-12-14 DIAGNOSIS — M868X6 Other osteomyelitis, lower leg: Secondary | ICD-10-CM

## 2022-12-14 DIAGNOSIS — L97319 Non-pressure chronic ulcer of right ankle with unspecified severity: Secondary | ICD-10-CM | POA: Diagnosis not present

## 2022-12-14 DIAGNOSIS — Z23 Encounter for immunization: Secondary | ICD-10-CM

## 2022-12-14 MED ORDER — CEFADROXIL 500 MG PO CAPS: 500.0000 mg | ORAL_CAPSULE | Freq: Two times a day (BID) | ORAL | 0 refills | Status: AC
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
## 2022-12-14 NOTE — Patient Instructions (Signed)
Antibiotics : continue to take cefadroxil 500mg  po twice a day for the next 4 weeks  Right leg wound: recommend to do daily dressing change if the wound looks wet. Can do every other day dressing change once is starts to appear dry.  Pain control: can use tylenol 500mg  po twice a day as needed for pain.    We will see you back in 4 wk. To assess if need to refer back to dr Ulice Bold

## 2022-12-16 DIAGNOSIS — M65331 Trigger finger, right middle finger: Secondary | ICD-10-CM | POA: Diagnosis not present

## 2022-12-16 DIAGNOSIS — I452 Bifascicular block: Secondary | ICD-10-CM | POA: Diagnosis not present

## 2022-12-16 DIAGNOSIS — M1811 Unilateral primary osteoarthritis of first carpometacarpal joint, right hand: Secondary | ICD-10-CM | POA: Diagnosis not present

## 2022-12-16 DIAGNOSIS — M5030 Other cervical disc degeneration, unspecified cervical region: Secondary | ICD-10-CM | POA: Diagnosis not present

## 2022-12-16 DIAGNOSIS — M62562 Muscle wasting and atrophy, not elsewhere classified, left lower leg: Secondary | ICD-10-CM | POA: Diagnosis not present

## 2022-12-16 DIAGNOSIS — B9561 Methicillin susceptible Staphylococcus aureus infection as the cause of diseases classified elsewhere: Secondary | ICD-10-CM | POA: Diagnosis not present

## 2022-12-16 DIAGNOSIS — L03115 Cellulitis of right lower limb: Secondary | ICD-10-CM | POA: Diagnosis not present

## 2022-12-16 DIAGNOSIS — M5137 Other intervertebral disc degeneration, lumbosacral region: Secondary | ICD-10-CM | POA: Diagnosis not present

## 2022-12-16 DIAGNOSIS — I700 Atherosclerosis of aorta: Secondary | ICD-10-CM | POA: Diagnosis not present

## 2022-12-16 DIAGNOSIS — M47812 Spondylosis without myelopathy or radiculopathy, cervical region: Secondary | ICD-10-CM | POA: Diagnosis not present

## 2022-12-16 DIAGNOSIS — M47814 Spondylosis without myelopathy or radiculopathy, thoracic region: Secondary | ICD-10-CM | POA: Diagnosis not present

## 2022-12-16 DIAGNOSIS — M21371 Foot drop, right foot: Secondary | ICD-10-CM | POA: Diagnosis not present

## 2022-12-16 DIAGNOSIS — H5462 Unqualified visual loss, left eye, normal vision right eye: Secondary | ICD-10-CM | POA: Diagnosis not present

## 2022-12-16 DIAGNOSIS — M25361 Other instability, right knee: Secondary | ICD-10-CM | POA: Diagnosis not present

## 2022-12-16 DIAGNOSIS — M869 Osteomyelitis, unspecified: Secondary | ICD-10-CM | POA: Diagnosis not present

## 2022-12-16 DIAGNOSIS — L97511 Non-pressure chronic ulcer of other part of right foot limited to breakdown of skin: Secondary | ICD-10-CM | POA: Diagnosis not present

## 2022-12-16 DIAGNOSIS — I119 Hypertensive heart disease without heart failure: Secondary | ICD-10-CM | POA: Diagnosis not present

## 2022-12-16 DIAGNOSIS — I69398 Other sequelae of cerebral infarction: Secondary | ICD-10-CM | POA: Diagnosis not present

## 2022-12-16 DIAGNOSIS — M8008XD Age-related osteoporosis with current pathological fracture, vertebra(e), subsequent encounter for fracture with routine healing: Secondary | ICD-10-CM | POA: Diagnosis not present

## 2022-12-16 DIAGNOSIS — M47817 Spondylosis without myelopathy or radiculopathy, lumbosacral region: Secondary | ICD-10-CM | POA: Diagnosis not present

## 2022-12-16 DIAGNOSIS — C8213 Follicular lymphoma grade II, intra-abdominal lymph nodes: Secondary | ICD-10-CM | POA: Diagnosis not present

## 2022-12-16 DIAGNOSIS — M62561 Muscle wasting and atrophy, not elsewhere classified, right lower leg: Secondary | ICD-10-CM | POA: Diagnosis not present

## 2022-12-16 DIAGNOSIS — G5603 Carpal tunnel syndrome, bilateral upper limbs: Secondary | ICD-10-CM | POA: Diagnosis not present

## 2022-12-20 DIAGNOSIS — L03115 Cellulitis of right lower limb: Secondary | ICD-10-CM | POA: Diagnosis not present

## 2022-12-20 DIAGNOSIS — L97511 Non-pressure chronic ulcer of other part of right foot limited to breakdown of skin: Secondary | ICD-10-CM | POA: Diagnosis not present

## 2022-12-20 DIAGNOSIS — M62561 Muscle wasting and atrophy, not elsewhere classified, right lower leg: Secondary | ICD-10-CM | POA: Diagnosis not present

## 2022-12-20 DIAGNOSIS — I119 Hypertensive heart disease without heart failure: Secondary | ICD-10-CM | POA: Diagnosis not present

## 2022-12-20 DIAGNOSIS — I700 Atherosclerosis of aorta: Secondary | ICD-10-CM | POA: Diagnosis not present

## 2022-12-20 DIAGNOSIS — G5603 Carpal tunnel syndrome, bilateral upper limbs: Secondary | ICD-10-CM | POA: Diagnosis not present

## 2022-12-20 DIAGNOSIS — M47812 Spondylosis without myelopathy or radiculopathy, cervical region: Secondary | ICD-10-CM | POA: Diagnosis not present

## 2022-12-20 DIAGNOSIS — C8213 Follicular lymphoma grade II, intra-abdominal lymph nodes: Secondary | ICD-10-CM | POA: Diagnosis not present

## 2022-12-20 DIAGNOSIS — M65331 Trigger finger, right middle finger: Secondary | ICD-10-CM | POA: Diagnosis not present

## 2022-12-20 DIAGNOSIS — M62562 Muscle wasting and atrophy, not elsewhere classified, left lower leg: Secondary | ICD-10-CM | POA: Diagnosis not present

## 2022-12-20 DIAGNOSIS — I69398 Other sequelae of cerebral infarction: Secondary | ICD-10-CM | POA: Diagnosis not present

## 2022-12-20 DIAGNOSIS — M21371 Foot drop, right foot: Secondary | ICD-10-CM | POA: Diagnosis not present

## 2022-12-20 DIAGNOSIS — M1811 Unilateral primary osteoarthritis of first carpometacarpal joint, right hand: Secondary | ICD-10-CM | POA: Diagnosis not present

## 2022-12-20 DIAGNOSIS — M25361 Other instability, right knee: Secondary | ICD-10-CM | POA: Diagnosis not present

## 2022-12-20 DIAGNOSIS — I452 Bifascicular block: Secondary | ICD-10-CM | POA: Diagnosis not present

## 2022-12-20 DIAGNOSIS — M5137 Other intervertebral disc degeneration, lumbosacral region: Secondary | ICD-10-CM | POA: Diagnosis not present

## 2022-12-20 DIAGNOSIS — M869 Osteomyelitis, unspecified: Secondary | ICD-10-CM | POA: Diagnosis not present

## 2022-12-20 DIAGNOSIS — M47814 Spondylosis without myelopathy or radiculopathy, thoracic region: Secondary | ICD-10-CM | POA: Diagnosis not present

## 2022-12-20 DIAGNOSIS — M8008XD Age-related osteoporosis with current pathological fracture, vertebra(e), subsequent encounter for fracture with routine healing: Secondary | ICD-10-CM | POA: Diagnosis not present

## 2022-12-20 DIAGNOSIS — B9561 Methicillin susceptible Staphylococcus aureus infection as the cause of diseases classified elsewhere: Secondary | ICD-10-CM | POA: Diagnosis not present

## 2022-12-20 DIAGNOSIS — M5030 Other cervical disc degeneration, unspecified cervical region: Secondary | ICD-10-CM | POA: Diagnosis not present

## 2022-12-20 DIAGNOSIS — H5462 Unqualified visual loss, left eye, normal vision right eye: Secondary | ICD-10-CM | POA: Diagnosis not present

## 2022-12-20 DIAGNOSIS — M47817 Spondylosis without myelopathy or radiculopathy, lumbosacral region: Secondary | ICD-10-CM | POA: Diagnosis not present

## 2022-12-23 DIAGNOSIS — C8213 Follicular lymphoma grade II, intra-abdominal lymph nodes: Secondary | ICD-10-CM | POA: Diagnosis not present

## 2022-12-23 DIAGNOSIS — I119 Hypertensive heart disease without heart failure: Secondary | ICD-10-CM | POA: Diagnosis not present

## 2022-12-23 DIAGNOSIS — L97511 Non-pressure chronic ulcer of other part of right foot limited to breakdown of skin: Secondary | ICD-10-CM | POA: Diagnosis not present

## 2022-12-23 DIAGNOSIS — G5603 Carpal tunnel syndrome, bilateral upper limbs: Secondary | ICD-10-CM | POA: Diagnosis not present

## 2022-12-23 DIAGNOSIS — M869 Osteomyelitis, unspecified: Secondary | ICD-10-CM | POA: Diagnosis not present

## 2022-12-23 DIAGNOSIS — H5462 Unqualified visual loss, left eye, normal vision right eye: Secondary | ICD-10-CM | POA: Diagnosis not present

## 2022-12-23 DIAGNOSIS — M47812 Spondylosis without myelopathy or radiculopathy, cervical region: Secondary | ICD-10-CM | POA: Diagnosis not present

## 2022-12-23 DIAGNOSIS — M62561 Muscle wasting and atrophy, not elsewhere classified, right lower leg: Secondary | ICD-10-CM | POA: Diagnosis not present

## 2022-12-23 DIAGNOSIS — L03115 Cellulitis of right lower limb: Secondary | ICD-10-CM | POA: Diagnosis not present

## 2022-12-23 DIAGNOSIS — I69398 Other sequelae of cerebral infarction: Secondary | ICD-10-CM | POA: Diagnosis not present

## 2022-12-23 DIAGNOSIS — M5030 Other cervical disc degeneration, unspecified cervical region: Secondary | ICD-10-CM | POA: Diagnosis not present

## 2022-12-23 DIAGNOSIS — M47814 Spondylosis without myelopathy or radiculopathy, thoracic region: Secondary | ICD-10-CM | POA: Diagnosis not present

## 2022-12-23 DIAGNOSIS — M25361 Other instability, right knee: Secondary | ICD-10-CM | POA: Diagnosis not present

## 2022-12-23 DIAGNOSIS — I452 Bifascicular block: Secondary | ICD-10-CM | POA: Diagnosis not present

## 2022-12-23 DIAGNOSIS — M62562 Muscle wasting and atrophy, not elsewhere classified, left lower leg: Secondary | ICD-10-CM | POA: Diagnosis not present

## 2022-12-23 DIAGNOSIS — B9561 Methicillin susceptible Staphylococcus aureus infection as the cause of diseases classified elsewhere: Secondary | ICD-10-CM | POA: Diagnosis not present

## 2022-12-23 DIAGNOSIS — M5137 Other intervertebral disc degeneration, lumbosacral region: Secondary | ICD-10-CM | POA: Diagnosis not present

## 2022-12-23 DIAGNOSIS — M65331 Trigger finger, right middle finger: Secondary | ICD-10-CM | POA: Diagnosis not present

## 2022-12-23 DIAGNOSIS — M21371 Foot drop, right foot: Secondary | ICD-10-CM | POA: Diagnosis not present

## 2022-12-23 DIAGNOSIS — M1811 Unilateral primary osteoarthritis of first carpometacarpal joint, right hand: Secondary | ICD-10-CM | POA: Diagnosis not present

## 2022-12-23 DIAGNOSIS — I700 Atherosclerosis of aorta: Secondary | ICD-10-CM | POA: Diagnosis not present

## 2022-12-23 DIAGNOSIS — M8008XD Age-related osteoporosis with current pathological fracture, vertebra(e), subsequent encounter for fracture with routine healing: Secondary | ICD-10-CM | POA: Diagnosis not present

## 2022-12-23 DIAGNOSIS — M47817 Spondylosis without myelopathy or radiculopathy, lumbosacral region: Secondary | ICD-10-CM | POA: Diagnosis not present

## 2022-12-27 DIAGNOSIS — M869 Osteomyelitis, unspecified: Secondary | ICD-10-CM | POA: Diagnosis not present

## 2022-12-27 DIAGNOSIS — L97511 Non-pressure chronic ulcer of other part of right foot limited to breakdown of skin: Secondary | ICD-10-CM | POA: Diagnosis not present

## 2022-12-27 DIAGNOSIS — M62562 Muscle wasting and atrophy, not elsewhere classified, left lower leg: Secondary | ICD-10-CM | POA: Diagnosis not present

## 2022-12-27 DIAGNOSIS — M8008XD Age-related osteoporosis with current pathological fracture, vertebra(e), subsequent encounter for fracture with routine healing: Secondary | ICD-10-CM | POA: Diagnosis not present

## 2022-12-27 DIAGNOSIS — M21371 Foot drop, right foot: Secondary | ICD-10-CM | POA: Diagnosis not present

## 2022-12-27 DIAGNOSIS — M5030 Other cervical disc degeneration, unspecified cervical region: Secondary | ICD-10-CM | POA: Diagnosis not present

## 2022-12-27 DIAGNOSIS — H5462 Unqualified visual loss, left eye, normal vision right eye: Secondary | ICD-10-CM | POA: Diagnosis not present

## 2022-12-27 DIAGNOSIS — L03115 Cellulitis of right lower limb: Secondary | ICD-10-CM | POA: Diagnosis not present

## 2022-12-27 DIAGNOSIS — C8213 Follicular lymphoma grade II, intra-abdominal lymph nodes: Secondary | ICD-10-CM | POA: Diagnosis not present

## 2022-12-27 DIAGNOSIS — G5603 Carpal tunnel syndrome, bilateral upper limbs: Secondary | ICD-10-CM | POA: Diagnosis not present

## 2022-12-27 DIAGNOSIS — M47812 Spondylosis without myelopathy or radiculopathy, cervical region: Secondary | ICD-10-CM | POA: Diagnosis not present

## 2022-12-27 DIAGNOSIS — M65331 Trigger finger, right middle finger: Secondary | ICD-10-CM | POA: Diagnosis not present

## 2022-12-27 DIAGNOSIS — I700 Atherosclerosis of aorta: Secondary | ICD-10-CM | POA: Diagnosis not present

## 2022-12-27 DIAGNOSIS — M62561 Muscle wasting and atrophy, not elsewhere classified, right lower leg: Secondary | ICD-10-CM | POA: Diagnosis not present

## 2022-12-27 DIAGNOSIS — B9561 Methicillin susceptible Staphylococcus aureus infection as the cause of diseases classified elsewhere: Secondary | ICD-10-CM | POA: Diagnosis not present

## 2022-12-27 DIAGNOSIS — M47814 Spondylosis without myelopathy or radiculopathy, thoracic region: Secondary | ICD-10-CM | POA: Diagnosis not present

## 2022-12-27 DIAGNOSIS — M47817 Spondylosis without myelopathy or radiculopathy, lumbosacral region: Secondary | ICD-10-CM | POA: Diagnosis not present

## 2022-12-27 DIAGNOSIS — M25361 Other instability, right knee: Secondary | ICD-10-CM | POA: Diagnosis not present

## 2022-12-27 DIAGNOSIS — I452 Bifascicular block: Secondary | ICD-10-CM | POA: Diagnosis not present

## 2022-12-27 DIAGNOSIS — I119 Hypertensive heart disease without heart failure: Secondary | ICD-10-CM | POA: Diagnosis not present

## 2022-12-27 DIAGNOSIS — M1811 Unilateral primary osteoarthritis of first carpometacarpal joint, right hand: Secondary | ICD-10-CM | POA: Diagnosis not present

## 2022-12-27 DIAGNOSIS — M5137 Other intervertebral disc degeneration, lumbosacral region: Secondary | ICD-10-CM | POA: Diagnosis not present

## 2022-12-27 DIAGNOSIS — I69398 Other sequelae of cerebral infarction: Secondary | ICD-10-CM | POA: Diagnosis not present

## 2022-12-29 DIAGNOSIS — Z0000 Encounter for general adult medical examination without abnormal findings: Secondary | ICD-10-CM | POA: Diagnosis not present

## 2022-12-29 DIAGNOSIS — Z9181 History of falling: Secondary | ICD-10-CM | POA: Diagnosis not present

## 2022-12-30 DIAGNOSIS — M1811 Unilateral primary osteoarthritis of first carpometacarpal joint, right hand: Secondary | ICD-10-CM | POA: Diagnosis not present

## 2022-12-30 DIAGNOSIS — C8213 Follicular lymphoma grade II, intra-abdominal lymph nodes: Secondary | ICD-10-CM | POA: Diagnosis not present

## 2022-12-30 DIAGNOSIS — L97511 Non-pressure chronic ulcer of other part of right foot limited to breakdown of skin: Secondary | ICD-10-CM | POA: Diagnosis not present

## 2022-12-30 DIAGNOSIS — M51379 Other intervertebral disc degeneration, lumbosacral region without mention of lumbar back pain or lower extremity pain: Secondary | ICD-10-CM | POA: Diagnosis not present

## 2022-12-30 DIAGNOSIS — M21371 Foot drop, right foot: Secondary | ICD-10-CM | POA: Diagnosis not present

## 2022-12-30 DIAGNOSIS — M5030 Other cervical disc degeneration, unspecified cervical region: Secondary | ICD-10-CM | POA: Diagnosis not present

## 2022-12-30 DIAGNOSIS — M25361 Other instability, right knee: Secondary | ICD-10-CM | POA: Diagnosis not present

## 2022-12-30 DIAGNOSIS — M62562 Muscle wasting and atrophy, not elsewhere classified, left lower leg: Secondary | ICD-10-CM | POA: Diagnosis not present

## 2022-12-30 DIAGNOSIS — B9561 Methicillin susceptible Staphylococcus aureus infection as the cause of diseases classified elsewhere: Secondary | ICD-10-CM | POA: Diagnosis not present

## 2022-12-30 DIAGNOSIS — H5462 Unqualified visual loss, left eye, normal vision right eye: Secondary | ICD-10-CM | POA: Diagnosis not present

## 2022-12-30 DIAGNOSIS — I69398 Other sequelae of cerebral infarction: Secondary | ICD-10-CM | POA: Diagnosis not present

## 2022-12-30 DIAGNOSIS — M47814 Spondylosis without myelopathy or radiculopathy, thoracic region: Secondary | ICD-10-CM | POA: Diagnosis not present

## 2022-12-30 DIAGNOSIS — I452 Bifascicular block: Secondary | ICD-10-CM | POA: Diagnosis not present

## 2022-12-30 DIAGNOSIS — M47812 Spondylosis without myelopathy or radiculopathy, cervical region: Secondary | ICD-10-CM | POA: Diagnosis not present

## 2022-12-30 DIAGNOSIS — M65331 Trigger finger, right middle finger: Secondary | ICD-10-CM | POA: Diagnosis not present

## 2022-12-30 DIAGNOSIS — M47817 Spondylosis without myelopathy or radiculopathy, lumbosacral region: Secondary | ICD-10-CM | POA: Diagnosis not present

## 2022-12-30 DIAGNOSIS — L03115 Cellulitis of right lower limb: Secondary | ICD-10-CM | POA: Diagnosis not present

## 2022-12-30 DIAGNOSIS — M8008XD Age-related osteoporosis with current pathological fracture, vertebra(e), subsequent encounter for fracture with routine healing: Secondary | ICD-10-CM | POA: Diagnosis not present

## 2022-12-30 DIAGNOSIS — I119 Hypertensive heart disease without heart failure: Secondary | ICD-10-CM | POA: Diagnosis not present

## 2022-12-30 DIAGNOSIS — M62561 Muscle wasting and atrophy, not elsewhere classified, right lower leg: Secondary | ICD-10-CM | POA: Diagnosis not present

## 2022-12-30 DIAGNOSIS — I700 Atherosclerosis of aorta: Secondary | ICD-10-CM | POA: Diagnosis not present

## 2022-12-30 DIAGNOSIS — M869 Osteomyelitis, unspecified: Secondary | ICD-10-CM | POA: Diagnosis not present

## 2022-12-30 DIAGNOSIS — G5603 Carpal tunnel syndrome, bilateral upper limbs: Secondary | ICD-10-CM | POA: Diagnosis not present

## 2023-01-13 ENCOUNTER — Other Ambulatory Visit: Payer: Self-pay

## 2023-01-13 ENCOUNTER — Encounter: Payer: Self-pay | Admitting: Internal Medicine

## 2023-01-13 ENCOUNTER — Ambulatory Visit (INDEPENDENT_AMBULATORY_CARE_PROVIDER_SITE_OTHER): Payer: Medicare Other | Admitting: Internal Medicine

## 2023-01-13 VITALS — BP 131/64 | HR 76 | Temp 97.3°F

## 2023-01-13 DIAGNOSIS — M2351 Chronic instability of knee, right knee: Secondary | ICD-10-CM | POA: Diagnosis not present

## 2023-01-13 DIAGNOSIS — B351 Tinea unguium: Secondary | ICD-10-CM
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
## 2023-01-24 ENCOUNTER — Ambulatory Visit (INDEPENDENT_AMBULATORY_CARE_PROVIDER_SITE_OTHER): Payer: Medicare Other | Admitting: Podiatry

## 2023-01-24 DIAGNOSIS — G14 Postpolio syndrome: Secondary | ICD-10-CM | POA: Diagnosis not present

## 2023-01-24 DIAGNOSIS — B351 Tinea unguium: Secondary | ICD-10-CM | POA: Diagnosis not present

## 2023-01-24 DIAGNOSIS — M79674 Pain in right toe(s): Secondary | ICD-10-CM

## 2023-01-24 DIAGNOSIS — M79675 Pain in left toe(s): Secondary | ICD-10-CM

## 2023-01-24 NOTE — Progress Notes (Addendum)
Patient late for appointment    Subjective:  Patient ID: Julia Hunt, female    DOB: [birth year],  MRN: [id number]  Chief Complaint  Patient presents with   Nail Problem    Nail trim-  right foot had polio when she was 15-  wears a long leg brace/ afo on the right side,  unable to trim her own nails bilateral.   Has a small non healing area on her right anterior shin that drains a little every day- she currently sees the infectious disease doctor at  -      89 y.o. female presents with the above complaint. History confirmed with patient. Patient presenting with pain related to dystrophic thickened elongated nails. Patient is unable to trim own nails related to nail dystrophy and/or mobility issues. Patient does not have a history of T2DM.   Objective:  Physical Exam: warm, good capillary refill nail exam onychomycosis of the toenails, onycholysis, and dystrophic nails protective sensation intact DP and PT pulses weakly palpable Left Foot:  Pain with palpation of nails due to elongation and dystrophic growth.  Right Foot: Pain with palpation of nails due to elongation and dystrophic growth.   Assessment:   1. Pain due to onychomycosis of toenails of both feet   2. Postpolio syndrome      Plan:  Patient was evaluated and treated and all questions answered.   #Onychomycosis with pain  -Nails palliatively debrided as below. -Educated on self-care  Procedure: Nail Debridement Rationale: Pain Type of Debridement: manual, sharp debridement. Instrumentation: Nail nipper, rotary burr. Number of Nails: 10  Return in about 3 months (around 04/26/2023) for RFC.         Corinna Gab, DPM [facility] / [facility]

## 2023-01-24 NOTE — Addendum Note (Signed)
Addended by: Carlena Hurl F on: 01/24/2023 04:13 PM   Modules accepted: Level of Service

## 2023-02-08 DIAGNOSIS — Z0389 Encounter for observation for other suspected diseases and conditions ruled out: Secondary | ICD-10-CM | POA: Diagnosis not present

## 2023-02-08 DIAGNOSIS — R051 Acute cough: Secondary | ICD-10-CM | POA: Diagnosis not present

## 2023-02-08 DIAGNOSIS — R300 Dysuria: Secondary | ICD-10-CM | POA: Diagnosis not present

## 2023-02-08 DIAGNOSIS — N390 Urinary tract infection, site not specified: Secondary | ICD-10-CM | POA: Diagnosis not present

## 2023-02-08 DIAGNOSIS — R509 Fever, unspecified: Secondary | ICD-10-CM | POA: Diagnosis not present

## 2023-02-08 DIAGNOSIS — I517 Cardiomegaly: Secondary | ICD-10-CM | POA: Diagnosis not present

## 2023-02-11 ENCOUNTER — Ambulatory Visit: Payer: Medicare Other | Admitting: Hematology and Oncology

## 2023-02-11 ENCOUNTER — Inpatient Hospital Stay: Payer: Medicare Other | Attending: Oncology

## 2023-02-11 ENCOUNTER — Other Ambulatory Visit: Payer: Medicare Other

## 2023-02-11 ENCOUNTER — Inpatient Hospital Stay: Payer: Medicare Other | Admitting: Hematology and Oncology

## 2023-02-14 DIAGNOSIS — I454 Nonspecific intraventricular block: Secondary | ICD-10-CM | POA: Diagnosis not present

## 2023-02-14 DIAGNOSIS — Z743 Need for continuous supervision: Secondary | ICD-10-CM | POA: Diagnosis not present

## 2023-02-14 DIAGNOSIS — J40 Bronchitis, not specified as acute or chronic: Secondary | ICD-10-CM | POA: Diagnosis not present

## 2023-02-14 DIAGNOSIS — R9431 Abnormal electrocardiogram [ECG] [EKG]: Secondary | ICD-10-CM | POA: Diagnosis not present

## 2023-02-14 DIAGNOSIS — I447 Left bundle-branch block, unspecified: Secondary | ICD-10-CM | POA: Diagnosis not present

## 2023-02-14 DIAGNOSIS — R0602 Shortness of breath: Secondary | ICD-10-CM | POA: Diagnosis not present

## 2023-02-14 DIAGNOSIS — R059 Cough, unspecified: Secondary | ICD-10-CM | POA: Diagnosis not present

## 2023-02-14 DIAGNOSIS — R531 Weakness: Secondary | ICD-10-CM | POA: Diagnosis not present

## 2023-02-14 DIAGNOSIS — J209 Acute bronchitis, unspecified: Secondary | ICD-10-CM | POA: Diagnosis not present

## 2023-02-15 ENCOUNTER — Ambulatory Visit: Payer: Medicare Other | Admitting: Internal Medicine

## 2023-02-16 ENCOUNTER — Telehealth: Payer: Self-pay

## 2023-02-16 NOTE — Telephone Encounter (Signed)
Patient called to apologize for missing appointment on 02/15/2023. Patient has pneumonia. Patient will call back to reschedule, wanted to inform Dr.Snider that her foot looks the same.    Fontaine Hehl Lesli Albee, CMA

## 2023-02-22 ENCOUNTER — Ambulatory Visit (INDEPENDENT_AMBULATORY_CARE_PROVIDER_SITE_OTHER): Payer: Medicare Other

## 2023-02-22 DIAGNOSIS — R55 Syncope and collapse: Secondary | ICD-10-CM

## 2023-02-22 LAB — CUP PACEART REMOTE DEVICE CHECK
Battery Remaining Longevity: 49 mo
Battery Remaining Percentage: 44 %
Battery Voltage: 2.98 V
Brady Statistic AP VP Percent: 52 %
Brady Statistic AP VS Percent: 6.8 %
Brady Statistic AS VP Percent: 5.7 %
Brady Statistic AS VS Percent: 35 %
Brady Statistic RA Percent Paced: 58 %
Brady Statistic RV Percent Paced: 58 %
Date Time Interrogation Session: 20241126020011
Implantable Lead Connection Status: 753985
Implantable Lead Connection Status: 753985
Implantable Lead Implant Date: 20190205
Implantable Lead Implant Date: 20190205
Implantable Lead Location: 753859
Implantable Lead Location: 753860
Implantable Pulse Generator Implant Date: 20190205
Lead Channel Impedance Value: 380 Ohm
Lead Channel Impedance Value: 560 Ohm
Lead Channel Pacing Threshold Amplitude: 0.5 V
Lead Channel Pacing Threshold Amplitude: 0.5 V
Lead Channel Pacing Threshold Pulse Width: 0.5 ms
Lead Channel Pacing Threshold Pulse Width: 0.5 ms
Lead Channel Sensing Intrinsic Amplitude: 2.3 mV
Lead Channel Sensing Intrinsic Amplitude: 6.9 mV
Lead Channel Setting Pacing Amplitude: 0.75 V
Lead Channel Setting Pacing Amplitude: 2 V
Lead Channel Setting Pacing Pulse Width: 0.5 ms
Lead Channel Setting Sensing Sensitivity: 2 mV
Pulse Gen Model: 2272
Pulse Gen Serial Number: 8993229

## 2023-03-17 DIAGNOSIS — M86661 Other chronic osteomyelitis, right tibia and fibula: Secondary | ICD-10-CM | POA: Diagnosis not present

## 2023-03-17 DIAGNOSIS — M21371 Foot drop, right foot: Secondary | ICD-10-CM | POA: Diagnosis not present

## 2023-03-24 NOTE — Progress Notes (Signed)
Remote pacemaker transmission.   

## 2023-03-28 ENCOUNTER — Telehealth: Payer: Self-pay

## 2023-03-28 NOTE — Telephone Encounter (Signed)
Patient left message with Angelique Blonder stating that her foot is still draining and having some back pain. Appointment is 03/31/23 at 1430 (lab) and 1500 Dr. Gilman Buttner.

## 2023-03-31 ENCOUNTER — Other Ambulatory Visit: Payer: Self-pay | Admitting: Oncology

## 2023-03-31 ENCOUNTER — Inpatient Hospital Stay: Payer: Medicare Other | Attending: Oncology

## 2023-03-31 ENCOUNTER — Telehealth: Payer: Self-pay | Admitting: Oncology

## 2023-03-31 ENCOUNTER — Inpatient Hospital Stay: Payer: Medicare Other | Admitting: Oncology

## 2023-03-31 ENCOUNTER — Encounter: Payer: Self-pay | Admitting: Oncology

## 2023-03-31 VITALS — BP 123/85 | HR 87 | Temp 98.1°F | Resp 18 | Ht 60.0 in | Wt 108.7 lb

## 2023-03-31 DIAGNOSIS — H9190 Unspecified hearing loss, unspecified ear: Secondary | ICD-10-CM | POA: Diagnosis not present

## 2023-03-31 DIAGNOSIS — R918 Other nonspecific abnormal finding of lung field: Secondary | ICD-10-CM | POA: Insufficient documentation

## 2023-03-31 DIAGNOSIS — S81801A Unspecified open wound, right lower leg, initial encounter: Secondary | ICD-10-CM

## 2023-03-31 DIAGNOSIS — K5730 Diverticulosis of large intestine without perforation or abscess without bleeding: Secondary | ICD-10-CM | POA: Insufficient documentation

## 2023-03-31 DIAGNOSIS — M86471 Chronic osteomyelitis with draining sinus, right ankle and foot: Secondary | ICD-10-CM

## 2023-03-31 DIAGNOSIS — R0602 Shortness of breath: Secondary | ICD-10-CM | POA: Diagnosis not present

## 2023-03-31 DIAGNOSIS — M549 Dorsalgia, unspecified: Secondary | ICD-10-CM | POA: Insufficient documentation

## 2023-03-31 DIAGNOSIS — C8213 Follicular lymphoma grade II, intra-abdominal lymph nodes: Secondary | ICD-10-CM | POA: Insufficient documentation

## 2023-03-31 DIAGNOSIS — M869 Osteomyelitis, unspecified: Secondary | ICD-10-CM | POA: Insufficient documentation

## 2023-03-31 DIAGNOSIS — R350 Frequency of micturition: Secondary | ICD-10-CM

## 2023-03-31 DIAGNOSIS — R5383 Other fatigue: Secondary | ICD-10-CM | POA: Diagnosis not present

## 2023-03-31 DIAGNOSIS — K862 Cyst of pancreas: Secondary | ICD-10-CM | POA: Diagnosis not present

## 2023-03-31 DIAGNOSIS — A4901 Methicillin susceptible Staphylococcus aureus infection, unspecified site: Secondary | ICD-10-CM | POA: Diagnosis not present

## 2023-03-31 DIAGNOSIS — D803 Selective deficiency of immunoglobulin G [IgG] subclasses: Secondary | ICD-10-CM

## 2023-03-31 LAB — CBC WITH DIFFERENTIAL (CANCER CENTER ONLY)
Abs Immature Granulocytes: 0.03 10*3/uL (ref 0.00–0.07)
Basophils Absolute: 0.1 10*3/uL (ref 0.0–0.1)
Basophils Relative: 1 %
Eosinophils Absolute: 0.1 10*3/uL (ref 0.0–0.5)
Eosinophils Relative: 1 %
HCT: 36.8 % (ref 36.0–46.0)
Hemoglobin: 12.3 g/dL (ref 12.0–15.0)
Immature Granulocytes: 0 %
Lymphocytes Relative: 16 %
Lymphs Abs: 1.4 10*3/uL (ref 0.7–4.0)
MCH: 29 pg (ref 26.0–34.0)
MCHC: 33.4 g/dL (ref 30.0–36.0)
MCV: 86.8 fL (ref 80.0–100.0)
Monocytes Absolute: 0.6 10*3/uL (ref 0.1–1.0)
Monocytes Relative: 7 %
Neutro Abs: 6.3 10*3/uL (ref 1.7–7.7)
Neutrophils Relative %: 75 %
Platelet Count: 185 10*3/uL (ref 150–400)
RBC: 4.24 MIL/uL (ref 3.87–5.11)
RDW: 15.8 % — ABNORMAL HIGH (ref 11.5–15.5)
WBC Count: 8.4 10*3/uL (ref 4.0–10.5)
nRBC: 0 % (ref 0.0–0.2)
nRBC: 0 /100{WBCs}

## 2023-03-31 LAB — CMP (CANCER CENTER ONLY)
ALT: 6 U/L (ref 0–44)
AST: 13 U/L — ABNORMAL LOW (ref 15–41)
Albumin: 4.3 g/dL (ref 3.5–5.0)
Alkaline Phosphatase: 82 U/L (ref 38–126)
Anion gap: 10 (ref 5–15)
BUN: 19 mg/dL (ref 8–23)
CO2: 25 mmol/L (ref 22–32)
Calcium: 9.7 mg/dL (ref 8.9–10.3)
Chloride: 107 mmol/L (ref 98–111)
Creatinine: 0.87 mg/dL (ref 0.44–1.00)
GFR, Estimated: >60 mL/min (ref >60)
Glucose, Bld: 109 mg/dL — ABNORMAL HIGH (ref 70–99)
Potassium: 4 mmol/L (ref 3.5–5.1)
Sodium: 142 mmol/L (ref 135–145)
Total Bilirubin: 0.4 mg/dL (ref 0.0–1.2)
Total Protein: 6.2 g/dL — ABNORMAL LOW (ref 6.5–8.1)

## 2023-03-31 LAB — URINALYSIS, COMPLETE (UACMP) WITH MICROSCOPIC
Bilirubin Urine: NEGATIVE
Glucose, UA: NEGATIVE mg/dL
Ketones, ur: NEGATIVE mg/dL
Nitrite: NEGATIVE
Protein, ur: NEGATIVE mg/dL
Specific Gravity, Urine: 1.025 (ref 1.005–1.030)
pH: 6 (ref 5.0–8.0)

## 2023-03-31 NOTE — Telephone Encounter (Signed)
 03/31/23 Spoke with patient and confirmed next appt.

## 2023-03-31 NOTE — Progress Notes (Signed)
 [facility]  [address] [place],  [place]  [address] [phone]  Clinic Day: 03/31/23  Referring physician: Keren Vicenta BRAVO, MD  ASSESSMENT & PLAN:  Assessment: Follicular lymphoma grade II of intra-abdominal lymph nodes [facility]) CT chest, abdomen and pelvis in July 2022 revealed with stable lymphadenopathy, but a slight increase in the pulmonary nodules. We decided against further evaluation with PET. CT imaging from January 2023 is stable to slightly improved. Repeat CT from July 2023 is stable with decrease in the left peri-aortic node, but with mild increase in the lung nodules. Her last scan was in August. 2024 and I think we need to reassess the status of her lymphoma with full CT scans.   Multiple Lung Nodules I suspect these are also lymphoma but we have not biopsied them to prove it. At least 3 are slowly but steadily enlarging consistent with progression of her lymphoma. We have held off on treatment due to potential toxicities and she still remains largely asymptomatic from these lesions. We will let her heal from her osteomyelitis and think about options. We could biopsy one of these lesions but there is always the risk of complications. We can consider treatment with something fairly mild, but there still would be a risk of toxicities. For now we have decided on closer surveillance.   Non-healing wound of anterior left ankle She has a history of multiple surgeries and skin grafting years ago but now has a persistent draining wound. She eventually went to the wound center and required admission to the hospital with findings of osteomyelitis, she was on IV antibiotics and was treated for a prolonged period of time. I think we need to evaluate to see if she still has osteomyelitis since she continues to have a chronic non healing wound. We will also evaluate her immunoglobulin levels to see if she has hypogammaglobulinemia. If so we could administer  IVIG infusions to help her healing. Certainly she is immunosuppressed with her lymphoma.   Plan: She had a MRI of the lower right extremity in August, 2024 which revealed cortical erosion along the anterior aspect of the distal tibia and adjacent talus with low level marrow edema and enhancement that is suspicious for osteomyelitis and MRI of the right forefoot revealed new/increased subcortical marrow edema and enhancement anteriorly in the distal tibia and talus compared with the previous MRI. These findings were also suspicious for osteomyelitis. She was treated with prolonged antibiotics but continues to have a non healing draining wound of the anterior right ankle. Her last CT chest, abdomen, and pelvis was on 11/09/2022. I will schedule a MRI of the forefoot and ankle to evaluate for osteomyelitis, and CT of chest, abdomen, and pelvis to restage her lymphoma. She has a WBC of 8.4, hemoglobin of 12.3, and platelet count of 185,000. Her CMP is normal besides a low total protein of 6.2 and AST of 13. I will add quantitative immunoglobulins to her labs today. I will also order a UA culture and wound culture. I will see her back in 3 weeks with CBC and CMP. The patient and her daughter understand the plans discussed today and are in agreement with them.  They know to contact our office if she develops concerns prior to her next appointment.  I provided 32 minutes of face-to-face time during this this encounter and > 50% was spent counseling as documented under my assessment and plan.   Wanda VEAR Cornish, MD  [facility]  [facility] [address] [place] [address] Dept: [phone] Dept Fax: [phone]    Orders Placed This Encounter  Procedures   Aerobic Culture w Gram Stain (superficial specimen)    Top of right foot    Standing Status:   Standing    Number of Occurrences:   1   QIG  (Quant. immunoglobulins  - IgG, IgA, IgM)     Standing Status:   Future    Number of Occurrences:   1    Expiration Date:   03/30/2024    CHIEF COMPLAINT:  CC: Follicular lymphoma Grade II follicular of intra-abdominal lymph nodes (HCC)  Current Treatment:  Observation  HISTORY OF PRESENT ILLNESS:   Oncology History  Follicular lymphoma grade II of intra-abdominal lymph nodes (HCC)  11/13/2019 Initial Diagnosis   Follicular lymphoma grade II of intra-abdominal lymph nodes (HCC)   12/14/2019 Cancer Staging   Staging form: Hodgkin and Non-Hodgkin Lymphoma, AJCC 8th Edition - Clinical stage from 12/14/2019: Stage I (Follicular lymphoma) - Signed by Cornelius Wanda DEL, MD on 01/31/2020     INTERVAL HISTORY:  Julia Hunt is here for routine follow up for grade 2 follicular lymphoma of intra-abdominal lymph nodes (HCC). Patient states that she feels ok but she complains of shortness of breath with exertion. She informed me that she does not have her hearing aid today and has felt confused. She complains of drainage of her right anterior ankle. She has visited the wound center prior but has no doctor attending to this. She had a MRI of the lower right extremity in August, 2024 which revealed cortical erosion along the anterior aspect of the distal tibia and adjacent talus with low level marrow edema and enhancement that is suspicious for osteomyelitis and MRI of the right forefoot revealed new/increased subcortical marrow edema and enhancement anteriorly in the distal tibia and talus compared with the previous MRI. These findings were also suspicious for osteomyelitis. She was treated with prolonged antibiotics but continues to have a non healing draining wound of the anterior right ankle. Her last CT chest, abdomen, and pelvis was on 11/09/2022. I will schedule a MRI of the forefoot and ankle to evaluate for osteomyelitis, and CT of chest, abdomen, and pelvis to restage her lymphoma so we can decide whether to treat. She has a WBC of 8.4, hemoglobin of  12.3, and platelet count of 185,000. Her CMP is normal besides a low total protein of 6.2 and AST of 13. I will add quantitative immunoglobulins to her labs today. I will also order a UA, urine culture and wound culture. I will see her back in 3 weeks with CBC and CMP.  She denies signs of infection such as sore throat, sinus drainage, cough, or urinary symptoms.  She denies fevers or recurrent chills. She denies pain. She denies nausea, vomiting, chest pain, dyspnea or cough. Her appetite is good and her weight has increased 1 pounds over last 4 months .  REVIEW OF SYSTEMS:  Review of Systems  Constitutional:  Positive for fatigue. Negative for appetite change, chills, diaphoresis, fever and unexpected weight change.  HENT:   Positive for hearing loss. Negative for lump/mass, mouth sores, nosebleeds, sore throat, tinnitus, trouble swallowing and voice change.   Eyes:  Positive for eye problems. Negative for icterus.  Respiratory:  Positive for shortness of breath (with exertion). Negative for chest tightness, cough, hemoptysis and wheezing.   Cardiovascular: Negative.  Negative for chest pain, leg swelling and palpitations.  Gastrointestinal: Negative.  Negative for abdominal distention, abdominal pain, blood in stool, constipation, diarrhea, nausea, rectal pain and vomiting.  Endocrine: Negative.   Genitourinary: Negative.  Negative for bladder incontinence, difficulty urinating, dyspareunia, dysuria, frequency, hematuria, menstrual problem, nocturia, pelvic pain, vaginal bleeding and vaginal discharge.   Musculoskeletal:  Positive for back pain (Chronic) and neck stiffness. Negative for arthralgias, flank pain, gait problem, myalgias and neck pain.       Pain in the right foot with diagnosis of osteomyelitis in the past treated with prolonged antibiotics. She still has a persistent draining wound of the anterior right ankle but it does not appear purulent, warm, or erythematous.   Skin:  Positive  for wound (She still has a persistent draining wound of the anterior right ankle but it does not appear purulent, warm, or erythematous.). Negative for itching and rash.  Neurological:  Positive for numbness (first two fingers of the right hand). Negative for dizziness, extremity weakness, gait problem, headaches, light-headedness, seizures and speech difficulty.  Hematological: Negative.  Negative for adenopathy. Does not bruise/bleed easily.  Psychiatric/Behavioral:  Positive for confusion (Decreased memory). Negative for decreased concentration, depression, sleep disturbance and suicidal ideas. The patient is not nervous/anxious.      VITALS:  Blood pressure 123/85, pulse 87, temperature 98.1 F (36.7 C), temperature source Oral, resp. rate 18, height 5' (1.524 m), weight 108 lb 11.2 oz (49.3 kg), SpO2 96%.  Wt Readings from Last 3 Encounters:  03/31/23 108 lb 11.2 oz (49.3 kg)  12/14/22 107 lb (48.5 kg)  11/11/22 114 lb 6.4 oz (51.9 kg)    Body mass index is 21.23 kg/m.  Performance status (ECOG): 1 - Symptomatic but completely ambulatory  PHYSICAL EXAM:  Physical Exam Vitals and nursing note reviewed.  Constitutional:      General: She is not in acute distress.    Appearance: Normal appearance. She is normal weight. She is not ill-appearing, toxic-appearing or diaphoretic.  HENT:     Head: Normocephalic and atraumatic.     Right Ear: Tympanic membrane, ear canal and external ear normal. There is no impacted cerumen.     Left Ear: Tympanic membrane, ear canal and external ear normal. There is no impacted cerumen.     Nose: Nose normal. No congestion or rhinorrhea.     Mouth/Throat:     Mouth: Mucous membranes are moist.     Pharynx: Oropharynx is clear. No oropharyngeal exudate or posterior oropharyngeal erythema.  Eyes:     General: No scleral icterus.       Right eye: No discharge.        Left eye: No discharge.     Extraocular Movements: Extraocular movements intact.      Conjunctiva/sclera: Conjunctivae normal.     Pupils: Pupils are equal, round, and reactive to light.  Neck:     Vascular: No carotid bruit.  Cardiovascular:     Rate and Rhythm: Normal rate and regular rhythm.     Pulses: Normal pulses.     Heart sounds: Normal heart sounds. No murmur heard.    No friction rub. No gallop.  Pulmonary:     Effort: Pulmonary effort is normal. No respiratory distress.     Breath sounds: Normal breath sounds. No stridor. No wheezing, rhonchi or rales.  Chest:     Chest wall: No tenderness.     Comments: Pacemaker in her left upper chest. No masses in either breast Abdominal:     General: Bowel sounds are normal. There  is no distension.     Palpations: Abdomen is soft. There is no hepatomegaly, splenomegaly or mass.     Tenderness: There is no abdominal tenderness. There is no right CVA tenderness, left CVA tenderness, guarding or rebound.     Hernia: No hernia is present.  Musculoskeletal:        General: No swelling, tenderness, deformity or signs of injury. Normal range of motion.     Cervical back: Normal range of motion and neck supple. No rigidity or tenderness.     Right lower leg: No edema.     Left lower leg: No edema.     Comments: Brace of the right leg with atrophy of both legs, left greater than right Right foot wound is bandaged and she has a slight amount of exudate which appears serous with a diffuse non-healing wound of the anterior right ankle.   Feet:     Comments: Right foot wound is bandaged.  Lymphadenopathy:     Cervical: No cervical adenopathy.     Right cervical: No superficial, deep or posterior cervical adenopathy.    Left cervical: No superficial, deep or posterior cervical adenopathy.     Upper Body:     Right upper body: No supraclavicular, axillary or pectoral adenopathy.     Left upper body: No supraclavicular, axillary or pectoral adenopathy.     Comments: Small node in the medial left supraclavicular fossa  Skin:     General: Skin is warm and dry.     Coloration: Skin is not jaundiced or pale.     Findings: No bruising, erythema, lesion or rash.  Neurological:     General: No focal deficit present.     Mental Status: She is alert and oriented to person, place, and time. Mental status is at baseline.     Cranial Nerves: No cranial nerve deficit.     Sensory: No sensory deficit.     Motor: No weakness.     Coordination: Coordination normal.     Gait: Gait normal.     Deep Tendon Reflexes: Reflexes normal.  Psychiatric:        Mood and Affect: Mood normal.        Behavior: Behavior normal.        Thought Content: Thought content normal.        Judgment: Judgment normal.     LABS:      Latest Ref Rng & Units 03/31/2023    2:25 PM 11/09/2022   12:00 AM 11/05/2022    3:37 AM  CBC  WBC 4.0 - 10.5 K/uL 8.4  7.7     6.2   Hemoglobin 12.0 - 15.0 g/dL 87.6  88.6     89.9   Hematocrit 36.0 - 46.0 % 36.8  33     31.3   Platelets 150 - 400 K/uL 185  291     187      This result is from an external source.      Latest Ref Rng & Units 03/31/2023    2:25 PM 11/09/2022   12:00 AM 11/04/2022   12:52 AM  CMP  Glucose 70 - 99 mg/dL 890   95   BUN 8 - 23 mg/dL 19  17     17    Creatinine 0.44 - 1.00 mg/dL 9.12  0.9     9.24   Sodium 135 - 145 mmol/L 142  141     143   Potassium 3.5 - 5.1 mmol/L 4.0  4.6     3.9   Chloride 98 - 111 mmol/L 107  104     107   CO2 22 - 32 mmol/L 25  31     24    Calcium  8.9 - 10.3 mg/dL 9.7  9.4     8.6   Total Protein 6.5 - 8.1 g/dL 6.2     Total Bilirubin 0.0 - 1.2 mg/dL 0.4     Alkaline Phos 38 - 126 U/L 82  73       AST 15 - 41 U/L 13  33       ALT 0 - 44 U/L 6  14          This result is from an external source.   Lab Results  Component Value Date   CEA1 5.3 (H) 10/01/2020   /  CEA  Date Value Ref Range Status  10/01/2020 5.3 (H) 0.0 - 4.7 ng/mL Final    Comment:    (NOTE)                             Nonsmokers          <3.9                             Smokers              <5.6 [employer] Electrochemiluminescence Immunoassay (ECLIA) Values obtained with different assay methods or kits cannot be used interchangeably.  Results cannot be interpreted as absolute evidence of the presence or absence of malignant disease. Performed At: [facility] [address] [place], [place] [id number] Jennette Shorter MD Ey:[id number]     Lab Results  Component Value Date   LDH 181 07/16/2021   LDH 175 01/14/2021   LDH 549 10/01/2020    STUDIES:  Exam: 11/09/2022 CT Chest, Abdomen, and Pelvis with Contrast Impression: Interval growth of bilateral pulmonary masses and nodules as detailed, favor progression of pulmonary lymphoma. Mild high left para-aortic adenopathy is mildly decreased. No new sites of lymphoma in the chest, abdomen, or pelvis. Lobulated 2.5cm posterior pancreatic tail cystic lesion with suggestion of thin internal septation, increased in size, cannot exclude cystic pancreatic neoplasm. No biliary or pancreatic duct dilation. GI consultation for consideration of EUS/FNA may be considered as clinically warranted given patient comorbidities. Patient is presumably not a candidate for MRI given pacemaker.  Marked sigmoid diverticulosis. Chronic Thoracolumbar vertebral compression fractures as detailed. Aortic Atherosclerosis (ICD10-170.0)  EXAM: 11/04/2022 MRI OF THE RIGHT FOREFOOT WITHOUT AND WITH CONTRAST IMPRESSION: 1. New/increased subcortical marrow edema and enhancement anteriorly in the distal tibia and talus compared with the previous MRI. These findings are suspicious for osteomyelitis given adjacent soft tissue edema and enhancement, further described on separate examination of the lower leg. 2. No evidence of osteomyelitis or septic arthritis within the foot. 3. Remote ankle fusion with solid ankylosis. Stable tibiotalar and subtalar degenerative changes. 4. Right lower leg findings dictated separately.  EXAM:  11/04/2022 MRI OF LOWER RIGHT EXTREMITY WITHOUT AND WITH CONTRAST IMPRESSION: 1. Apparent skin ulceration anteriorly in the distal lower leg with underlying heterogeneous enhancement consistent with soft tissue infection. No focal fluid collection identified. 2. Cortical erosion along the anterior aspect of the distal tibia and adjacent talus with low level marrow edema and enhancement, suspicious for osteomyelitis. 3. No acute osseous findings in the proximal lower leg. 4. Severe fatty  atrophy of the right lower leg musculature attributed to polio. 5. Remote tibiotalar arthrodesis with solid ankylosis between the distal tibia, distal fibula and the talus. 6. Right foot findings dictated separately.  HISTORY:   Allergies:  Allergies  Allergen Reactions   Other Other (See Comments)    Pt states she is not allergic.   Clarithromycin Other (See Comments) and Rash    Pt states she is not allergic.   Penicillins Other (See Comments) and Rash    Pt states she is not allergic.   Sulfa Antibiotics Other (See Comments) and Rash    Pt states she is not allergic.    Current Medications: Current Outpatient Medications  Medication Sig Dispense Refill   Calcium  Carbonate-Vitamin D  600-400 MG-UNIT tablet Take 1 tablet by mouth daily.     cefTRIAXone  (ROCEPHIN ) 2 g injection  (Patient not taking: Reported on 12/14/2022)     levothyroxine  (SYNTHROID ) 88 MCG tablet Take 88 mcg by mouth daily. Rotates it with 75mcg every other day     metoprolol  succinate (TOPROL -XL) 50 MG 24 hr tablet TAKE 1 TABLET BY MOUTH DAILY. TAKE WITH OR IMMEDIATELY FOLLOWING A MEAL. 90 tablet 3   Multiple Vitamin (MULTIVITAMIN) capsule Take 1 capsule by mouth daily.     omeprazole (PRILOSEC) 20 MG capsule Take 20 mg by mouth as needed (heartburn).     sertraline  (ZOLOFT ) 100 MG tablet Take 100 mg by mouth daily.     traMADol  (ULTRAM ) 50 MG tablet Take 50 mg by mouth daily.   0   No current facility-administered  medications for this visit.    I,Jasmine M Lassiter,acting as a scribe for Wanda VEAR Cornish, MD.,have documented all relevant documentation on the behalf of Wanda VEAR Cornish, MD,as directed by  Wanda VEAR Cornish, MD while in the presence of Wanda VEAR Cornish, MD.   I have reviewed this report as typed by the medical scribe, and it is complete and accurate.

## 2023-04-01 LAB — URINE CULTURE: Culture: NO GROWTH

## 2023-04-01 LAB — IGG, IGA, IGM
IgA: 149 mg/dL (ref 64–422)
IgG (Immunoglobin G), Serum: 529 mg/dL — ABNORMAL LOW (ref 586–1602)
IgM (Immunoglobulin M), Srm: 81 mg/dL (ref 26–217)

## 2023-04-03 LAB — AEROBIC CULTURE W GRAM STAIN (SUPERFICIAL SPECIMEN): Gram Stain: NONE SEEN

## 2023-04-04 DIAGNOSIS — D803 Selective deficiency of immunoglobulin G [IgG] subclasses: Secondary | ICD-10-CM | POA: Insufficient documentation

## 2023-04-04 DIAGNOSIS — A4901 Methicillin susceptible Staphylococcus aureus infection, unspecified site: Secondary | ICD-10-CM | POA: Insufficient documentation

## 2023-04-11 ENCOUNTER — Telehealth: Payer: Self-pay

## 2023-04-11 NOTE — Telephone Encounter (Signed)
 Patient does not have an orthopedic surgeon. Would like for [place] to send her to an orthopedic surgeon. Is it ok for me to refer her to [facility] orthopedics?

## 2023-04-11 NOTE — Telephone Encounter (Signed)
-----   Message from Julia Hunt sent at 04/04/2023  9:37 AM EST ----- Regarding: wound cult Her wound cult shows staph aureus but no sensitivities done.  Is it MRSA? Call the lab for more info  Tell her urine is clear but wound still shows staph.  I need to order ab's but need to see if it's MRSA.  We need the help of her Orthopedic surgeon, pls schedule appt for her. Her IgG is low (immunoglobulin) - she may need IVIG infusions but want to see her scan results 1st.

## 2023-04-12 ENCOUNTER — Telehealth: Payer: Self-pay

## 2023-04-12 ENCOUNTER — Other Ambulatory Visit: Payer: Self-pay | Admitting: Oncology

## 2023-04-12 DIAGNOSIS — L089 Local infection of the skin and subcutaneous tissue, unspecified: Secondary | ICD-10-CM

## 2023-04-12 MED ORDER — CIPROFLOXACIN HCL 500 MG PO TABS: 500.0000 mg | ORAL_TABLET | Freq: Two times a day (BID) | ORAL | 0 refills | Status: AC

## 2023-04-12 NOTE — Telephone Encounter (Signed)
 Attempted to contact patient. No answer.

## 2023-04-12 NOTE — Telephone Encounter (Signed)
-----   Message from Wanda VEAR Cornish sent at 04/12/2023  1:35 PM EST ----- Regarding: Staph wound Her prior Ortho was Dr. 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 But if bone scan or Ortho thinks it is osteomyielitis, she may need something more. She is sched for bone scan and f/u soon

## 2023-04-18 ENCOUNTER — Ambulatory Visit (HOSPITAL_COMMUNITY)
Admission: RE | Admit: 2023-04-18 | Discharge: 2023-04-18 | Disposition: A | Discharge status: Home / Self Care | Payer: Medicare Other | Source: Ambulatory Visit | Attending: Oncology | Admitting: Oncology

## 2023-04-18 DIAGNOSIS — K8689 Other specified diseases of pancreas: Secondary | ICD-10-CM | POA: Diagnosis not present

## 2023-04-18 DIAGNOSIS — C8213 Follicular lymphoma grade II, intra-abdominal lymph nodes: Secondary | ICD-10-CM | POA: Diagnosis not present

## 2023-04-18 DIAGNOSIS — C8290 Follicular lymphoma, unspecified, unspecified site: Secondary | ICD-10-CM | POA: Diagnosis not present

## 2023-04-18 DIAGNOSIS — R918 Other nonspecific abnormal finding of lung field: Secondary | ICD-10-CM | POA: Diagnosis not present

## 2023-04-18 DIAGNOSIS — K7689 Other specified diseases of liver: Secondary | ICD-10-CM | POA: Diagnosis not present

## 2023-04-18 MED ORDER — IOHEXOL 9 MG/ML PO SOLN
500.0000 mL | ORAL | Status: AC
  Administered 2023-04-18 (×2): 500 mL via ORAL

## 2023-04-18 MED ORDER — IOHEXOL 350 MG/ML SOLN
60.0000 mL | Freq: Once | INTRAVENOUS | Status: AC | PRN
  Administered 2023-04-18: 60 mL via INTRAVENOUS

## 2023-04-20 NOTE — Progress Notes (Signed)
[name]  [address] [place],  [place]  [address] [phone]  Clinic Day: 04/21/2023  Referring physician: [name], MD  ASSESSMENT & PLAN:  Assessment: Follicular lymphoma grade II of intra-abdominal lymph nodes [name]) CT chest, abdomen and pelvis in July 2022 revealed with stable lymphadenopathy, but a slight increase in the pulmonary nodules. We decided against further evaluation with PET. CT imaging from January 2023 is stable to slightly improved. Repeat CT from July 2023 is stable with decrease in the left peri-aortic node, but with mild increase in the lung nodules. Her last scan was in August. 2024 and there is not significant change in her new scan currently. CT chest, abdomen and pelvis done on 04/18/2023 revealed no significant change in her lymphoma. She has bilateral pulmonary nodules with the larger nodules measuring 2.9 x 2.2 cm in the right upper lobe, 4.2 x 2.0 cm in the right medial lobe, and 1.6 x 1.1 cm in the left lower lobe that are consistent with pulmonary lymphomatous involvement. There are unchanged matted appearing left retroperitoneal lymph nodes, and no new lymphadenopathy or soft tissue disease in the chest, abdomen, or pelvis.  Multiple Lung Nodules I suspect these are also lymphoma but we have not biopsied them to prove it. At least 3 are slowly but steadily enlarging consistent with progression of her lymphoma. We have held off on treatment due to her age and potential toxicities and she still remains largely asymptomatic from these lesions. We will let her heal from her osteomyelitis and think about options. We could biopsy one of these lesions but there is always the risk of complications. We can consider treatment with something fairly mild, but there still would be a risk of toxicities. For now we have decided on closer surveillance.   Pancreatic Cyst CT scan done on 04/18/2023 reveals an unchanged lobulated  cystic lesion of the pancreatic tail, measuring 2.3 x 1.2 cm, with distal ductal dilatation and atrophy were also found. This most likely reflects an IPMN. In view of her age and stability, we will not pursue further evaluation or treatment of this. We will monitor it.   Non-healing wound of anterior left ankle She has a history of multiple surgeries and skin grafting years ago but now has a persistent draining wound. She eventually went to the wound center and required admission to the hospital with findings of osteomyelitis, she was on IV antibiotics and was treated for a prolonged period of time. I think we need to evaluate to see if she still has osteomyelitis since she continues to have a chronic non healing wound.   IgG Immunodeficiency Her IgG level is low at 529 with normal IgA and IgM. This is most likely due to her lymphoma but contributes to her immunodeficiency. If this wound won't heal, we can consider immunoglobulin infusions.   Plan: She was treated last summer with prolonged antibiotics for persistent wound of the right anterior ankle/foot and cultures at that time revealed methicillin sensitive Staphylococcus Aureus. This organism was sensitive to Ciprofloxacin. She had a recent culture of the wound done on her foot/ankle that was positive for Staphylococcus Aureus but not MRSA. She informed me that all the things on her allergy list she is not allergic too, she has previously tried to have this taken off her list and she denies allergy to penicillin. I ordered Cipro 500mg  BID for 3 weeks and possibly longer but she has not picked it up yet. She  was followed by Infectious disease specialist for her prior wound and presumed osteomyelitis and I informed her that I would like their opinions on her wound and her immunodeficiency. She has a MRI of the foot and ankle area scheduled on 05/10/2023. I informed her that she has a IgG deficiency due to her lymphoma and tried to explain the  significance of this. I informed her that there is a way to treat this through immunoglobulin infusions once monthly. I will refer her to infectious disease after the MRI is done. She had a CT chest, abdomen and pelvis done on 04/18/2023 that revealed no significant change in her lymphoma. She has bilateral pulmonary nodules with the larger nodules measuring 2.9 x 2.2 cm in the right upper lobe, 4.2 x 2.0 cm in the right medial lobe, and 1.6 x 1.1 cm in the left lower lobe that are consistent with pulmonary lymphomatous involvement. There are unchanged matted appearing left retroperitoneal lymph nodes, and no new lymphadenopathy or soft tissue disease in the chest, abdomen, or pelvis.  She has an unchanged lobulated cystic lesion of the pancreatic tail, measuring 2.3 x 1.2 cm, with distal ductal dilatation and atrophy.  This most likely reflects an IPMN. I don't recommend treatment for her lymphoma at this time as her results has been stable since August, 2024 and she is essentially asymptomatic. She informed me that one night she felt like she got bite in the middle of the night and now has mild swelling and erythema of the upper calf of the left leg at the anterior aspect. I believe this to be a superficial phlebitis. I will see her back in 3 weeks with CBC, CMP, SPEP, and quantitative immunoglobulins after her MRI is done. We can then refer her to infectious disease for follow-up and recommendations regarding further antibiotics and immunoglobulin therapy. I encouraged her to have a family member come with her to this appointment. The patient and her daughter understand the plans discussed today and are in agreement with them.  They know to contact our office if she develops concerns prior to her next appointment.  I provided 23 minutes of face-to-face time during this this encounter and > 50% was spent counseling as documented under my assessment and plan.   Dellia Beckwith, MD  [facility] [address] [place] [address] Dept: [phone] Dept Fax: [phone]    No orders of the defined types were placed in this encounter.   CHIEF COMPLAINT:  CC: Follicular lymphoma Grade II follicular of intra-abdominal lymph nodes (HCC)  Current Treatment:  Observation  HISTORY OF PRESENT ILLNESS:   Oncology History  Follicular lymphoma grade II of intra-abdominal lymph nodes (HCC)  11/13/2019 Initial Diagnosis   Follicular lymphoma grade II of intra-abdominal lymph nodes (HCC)   12/14/2019 Cancer Staging   Staging form: Hodgkin and Non-Hodgkin Lymphoma, AJCC 8th Edition - Clinical stage from 12/14/2019: Stage I (Follicular lymphoma) - Signed by Dellia Beckwith, MD on 01/31/2020     INTERVAL HISTORY:  Julia Hunt is here for routine follow up for grade 2 follicular lymphoma of intra-abdominal lymph nodes (HCC). She was treated last summer with prolonged antibiotics for persistent wound of the right anterior ankle/foot and cultures at that time revealed methicillin sensitive Staphylococcus Aureus. This organism was sensitive to Ciprofloxacin. Patient states that she feels ok and has no complaints of pain. She had a recent culture of the wound done on her foot/ankle  that was positive for Staphylococcus Aureus but not MRSA. She informed me that all the things on her allergy list she is not allergic too, she has previously tried to have this taken off her list and she denies allergy to penicillin. I ordered Cipro 500mg  BID for 3 weeks and possibly longer but she has not picked it up yet. She was followed by infectious disease specialist for her prior wound and osteomyelitis and I informed her that I would like their opinions on her wound and her immunodeficiency. She has a MRI of the foot and ankle area scheduled on 05/10/2023. I informed her that she has a IgG deficiency due to her lymphoma and tried to explain the significance of  this. I informed her that there is a way to treat this through immunoglobulin infusions once monthly. I will refer her to infectious disease after the MRI is done. She had a CT chest, abdomen and pelvis done on 04/18/2023 that revealed no significant change in her lymphoma. She has bilateral pulmonary nodules with the larger nodules measuring 2.9 x 2.2 cm in the right upper lobe, 4.2 x 2.0 cm in the right medial lobe, and 1.6 x 1.1cm in the left lower lobe that are consistent with pulmonary lymphomatous involvement. There are unchanged matted appearing left retroperitoneal lymph nodes, and no new lymphadenopathy or soft tissue disease in the chest, abdomen, or pelvis. Unchanged lobulated cystic lesion of the pancreatic tail, measuring 2.3 x 1.2 cm, with distal ductal dilatation and atrophy were also found. This most likely reflects an IPMN. I don't recommend treatment for her lymphoma at this time as her results has been stable since August, 2024 and she is essentially asymptomatic. She informed me that one night she felt like she got bite in the middle of the night and now has mild swelling and erythema of the upper calf of the left leg at the anterior aspect. I believe this to be a superficial phlebitis. I will see her back in 3 weeks with CBC, CMP, SPEP, and quantitative immunoglobulins after her MRI is done. We can then refer her to infectious disease for follow-up and recommendations regarding further antibiotics and immunoglobulin therapy. I encouraged her to have a family member come with her to this appointment.   She denies signs of infection such as sore throat, sinus drainage, cough, or urinary symptoms.  She denies fevers or recurrent chills. She denies nausea, vomiting, chest pain, dyspnea or cough. Her appetite is good and her weight has decreased 1 pounds over last 3 weeks .  REVIEW OF SYSTEMS:  Review of Systems  Constitutional:  Positive for fatigue. Negative for appetite change, chills,  diaphoresis, fever and unexpected weight change.  HENT:   Positive for hearing loss. Negative for lump/mass, mouth sores, nosebleeds, sore throat, tinnitus, trouble swallowing and voice change.   Eyes:  Positive for eye problems. Negative for icterus.  Respiratory:  Positive for shortness of breath (with exertion). Negative for chest tightness, cough, hemoptysis and wheezing.   Cardiovascular: Negative.  Negative for chest pain, leg swelling and palpitations.  Gastrointestinal: Negative.  Negative for abdominal distention, abdominal pain, blood in stool, constipation, diarrhea, nausea, rectal pain and vomiting.  Endocrine: Negative.   Genitourinary: Negative.  Negative for bladder incontinence, difficulty urinating, dyspareunia, dysuria, frequency, hematuria, menstrual problem, nocturia, pelvic pain, vaginal bleeding and vaginal discharge.   Musculoskeletal:  Positive for back pain (Chronic). Negative for arthralgias, flank pain, gait problem, myalgias, neck pain and neck stiffness.  Pain in the right foot with diagnosis of osteomyelitis in the past treated with prolonged antibiotics. She still has a persistent draining wound of the anterior right ankle but it does not appear purulent, warm, or erythematous.   Skin:  Positive for wound (She still has a persistent draining wound of the anterior right ankle but it does not appear purulent, warm, or erythematous.). Negative for itching and rash.  Neurological:  Positive for numbness (first two fingers of the right hand). Negative for dizziness, extremity weakness, gait problem, headaches, light-headedness, seizures and speech difficulty.  Hematological: Negative.  Negative for adenopathy. Does not bruise/bleed easily.  Psychiatric/Behavioral:  Positive for confusion (Decreased memory). Negative for decreased concentration, depression, sleep disturbance and suicidal ideas. The patient is not nervous/anxious.        Memory loss     VITALS:  Blood  pressure 120/73, pulse 67, resp. rate 18, height 5' (1.524 m), weight 107 lb 9.6 oz (48.8 kg), SpO2 98%.  Wt Readings from Last 3 Encounters:  04/21/23 107 lb 9.6 oz (48.8 kg)  03/31/23 108 lb 11.2 oz (49.3 kg)  12/14/22 107 lb (48.5 kg)    Body mass index is 21.01 kg/m.  Performance status (ECOG): 1 - Symptomatic but completely ambulatory  PHYSICAL EXAM:  Physical Exam Vitals and nursing note reviewed.  Constitutional:      General: She is not in acute distress.    Appearance: Normal appearance. She is normal weight. She is not ill-appearing, toxic-appearing or diaphoretic.  HENT:     Head: Normocephalic and atraumatic.     Right Ear: Tympanic membrane, ear canal and external ear normal. There is no impacted cerumen.     Left Ear: Tympanic membrane, ear canal and external ear normal. There is no impacted cerumen.     Nose: Nose normal. No congestion or rhinorrhea.     Mouth/Throat:     Mouth: Mucous membranes are moist.     Pharynx: Oropharynx is clear. No oropharyngeal exudate or posterior oropharyngeal erythema.  Eyes:     General: No scleral icterus.       Right eye: No discharge.        Left eye: No discharge.     Extraocular Movements: Extraocular movements intact.     Conjunctiva/sclera: Conjunctivae normal.     Pupils: Pupils are equal, round, and reactive to light.  Neck:     Vascular: No carotid bruit.  Cardiovascular:     Rate and Rhythm: Normal rate and regular rhythm.     Pulses: Normal pulses.     Heart sounds: Normal heart sounds. No murmur heard.    No friction rub. No gallop.  Pulmonary:     Effort: Pulmonary effort is normal. No respiratory distress.     Breath sounds: Normal breath sounds. No stridor. No wheezing, rhonchi or rales.  Chest:     Chest wall: No tenderness.     Comments: Pacemaker in her left upper chest. No masses in either breast Abdominal:     General: Bowel sounds are normal. There is no distension.     Palpations: Abdomen is soft.  There is no hepatomegaly, splenomegaly or mass.     Tenderness: There is no abdominal tenderness. There is no right CVA tenderness, left CVA tenderness, guarding or rebound.     Hernia: No hernia is present.  Musculoskeletal:        General: No swelling, tenderness, deformity or signs of injury. Normal range of motion.     Cervical  back: Normal range of motion and neck supple. No rigidity or tenderness.     Right lower leg: No edema.     Left lower leg: No edema.     Comments: Brace of the right leg with atrophy of both legs, left greater than right Right foot wound is bandaged and she has a slight amount of exudate which appears serous with a diffuse non-healing wound of the anterior right ankle.   Feet:     Comments: Right foot wound is bandaged.  Lymphadenopathy:     Cervical: No cervical adenopathy.     Right cervical: No superficial, deep or posterior cervical adenopathy.    Left cervical: No superficial, deep or posterior cervical adenopathy.     Upper Body:     Right upper body: No supraclavicular, axillary or pectoral adenopathy.     Left upper body: No supraclavicular, axillary or pectoral adenopathy.     Comments: Small node in the medial left supraclavicular fossa  Skin:    General: Skin is warm and dry.     Coloration: Skin is not jaundiced or pale.     Findings: No bruising, erythema, lesion or rash.     Comments: Swelling of the upper calf of the left lower extremity and redness of the anterior aspect with a blue vein in the center of this area. I suspect a superficial phlebitis.  Bruising of the skin coming down the anterior tibia.   Neurological:     General: No focal deficit present.     Mental Status: She is alert and oriented to person, place, and time. Mental status is at baseline.     Cranial Nerves: No cranial nerve deficit.     Sensory: No sensory deficit.     Motor: No weakness.     Coordination: Coordination normal.     Gait: Gait normal.     Deep Tendon  Reflexes: Reflexes normal.  Psychiatric:        Mood and Affect: Mood normal.        Behavior: Behavior normal.        Thought Content: Thought content normal.        Judgment: Judgment normal.     LABS:      Latest Ref Rng & Units 03/31/2023    2:25 PM 11/09/2022   12:00 AM 11/05/2022    3:37 AM  CBC  WBC 4.0 - 10.5 K/uL 8.4  7.7     6.2   Hemoglobin 12.0 - 15.0 g/dL 16.1  09.6     04.5   Hematocrit 36.0 - 46.0 % 36.8  33     31.3   Platelets 150 - 400 K/uL 185  291     187      This result is from an external source.      Latest Ref Rng & Units 03/31/2023    2:25 PM 11/09/2022   12:00 AM 11/04/2022   12:52 AM  CMP  Glucose 70 - 99 mg/dL 409   95   BUN 8 - 23 mg/dL 19  17     17    Creatinine 0.44 - 1.00 mg/dL 8.11  0.9     9.14   Sodium 135 - 145 mmol/L 142  141     143   Potassium 3.5 - 5.1 mmol/L 4.0  4.6     3.9   Chloride 98 - 111 mmol/L 107  104     107   CO2 22 - 32 mmol/L  25  31     24    Calcium 8.9 - 10.3 mg/dL 9.7  9.4     8.6   Total Protein 6.5 - 8.1 g/dL 6.2     Total Bilirubin 0.0 - 1.2 mg/dL 0.4     Alkaline Phos 38 - 126 U/L 82  73       AST 15 - 41 U/L 13  33       ALT 0 - 44 U/L 6  14          This result is from an external source.   Lab Results  Component Value Date   CEA1 5.3 (H) 10/01/2020   /  CEA  Date Value Ref Range Status  10/01/2020 5.3 (H) 0.0 - 4.7 ng/mL Final    Comment:    (NOTE)                             Nonsmokers          <3.9                             Smokers             <5.6 [employer] Electrochemiluminescence Immunoassay (ECLIA) Values obtained with different assay methods or kits cannot be used interchangeably.  Results cannot be interpreted as absolute evidence of the presence or absence of malignant disease. Performed At: [facility] [address] [place], [place] [id number] Jolene Schimke MD WU:[id number]    Lab Results  Component Value Date   LDH 181 07/16/2021   LDH 175 01/14/2021   LDH 549  10/01/2020    STUDIES:   EXAM: 04/18/2023 CT CHEST, ABDOMEN, AND PELVIS WITH CONTRAST IMPRESSION: 1. No significant change in bilateral pulmonary nodules, consistent with pulmonary lymphomatous involvement. 2. Unchanged matted appearing left retroperitoneal lymph nodes. 3. No new lymphadenopathy or soft tissue disease in the chest, abdomen, or pelvis. 4. Unchanged lobulated cystic lesion of the pancreatic tail, measuring 2.3 x 1.2 cm, with distal ductal dilatation and atrophy. This most likely reflects an IPMN, and despite suggestion of main duct involvement is of very doubtful clinical significance given advanced patient age greater than 60. Attention on follow-up. 5. Cardiomegaly and coronary artery disease. 6. Aortic Atherosclerosis (ICD10-I70.0).   HISTORY:   Allergies:  Allergies  Allergen Reactions   Other Other (See Comments)    Pt states she is not allergic.   Clarithromycin Other (See Comments) and Rash    Pt states she is not allergic.   Penicillins Other (See Comments) and Rash    Pt states she is not allergic.   Sulfa Antibiotics Other (See Comments) and Rash    Pt states she is not allergic.    Current Medications: Current Outpatient Medications  Medication Sig Dispense Refill   Calcium Carbonate-Vitamin D 600-400 MG-UNIT tablet Take 1 tablet by mouth daily.     ciprofloxacin (CIPRO) 500 MG tablet Take 1 tablet (500 mg total) by mouth 2 (two) times daily for 21 days. 42 tablet 0   levothyroxine (SYNTHROID) 88 MCG tablet Take 88 mcg by mouth daily. Rotates it with every other day     metoprolol succinate (TOPROL-XL) 50 MG 24 hr tablet TAKE 1 TABLET BY MOUTH DAILY. TAKE WITH OR IMMEDIATELY FOLLOWING A MEAL. 90 tablet 3   Multiple Vitamin (MULTIVITAMIN) capsule Take 1 capsule by mouth daily.  omeprazole (PRILOSEC) 20 MG capsule Take 20 mg by mouth as needed (heartburn).     sertraline (ZOLOFT) 100 MG tablet Take 100 mg by mouth daily.     traMADol (ULTRAM)  50 MG tablet Take 50 mg by mouth daily.   0   No current facility-administered medications for this visit.    I,Jasmine M Lassiter,acting as a scribe for Dellia Beckwith, MD.,have documented all relevant documentation on the behalf of Dellia Beckwith, MD,as directed by  Dellia Beckwith, MD while in the presence of Dellia Beckwith, MD.   I have reviewed this report as typed by the medical scribe, and it is complete and accurate.

## 2023-04-21 ENCOUNTER — Telehealth: Payer: Self-pay | Admitting: Oncology

## 2023-04-21 ENCOUNTER — Inpatient Hospital Stay: Payer: Medicare Other | Admitting: Oncology

## 2023-04-21 ENCOUNTER — Encounter: Payer: Self-pay | Admitting: Oncology

## 2023-04-21 ENCOUNTER — Other Ambulatory Visit: Payer: Self-pay | Admitting: Oncology

## 2023-04-21 VITALS — BP 120/73 | HR 67 | Resp 18 | Ht 60.0 in | Wt 107.6 lb

## 2023-04-21 DIAGNOSIS — D803 Selective deficiency of immunoglobulin G [IgG] subclasses: Secondary | ICD-10-CM | POA: Diagnosis not present

## 2023-04-21 DIAGNOSIS — M86471 Chronic osteomyelitis with draining sinus, right ankle and foot: Secondary | ICD-10-CM

## 2023-04-21 DIAGNOSIS — C8213 Follicular lymphoma grade II, intra-abdominal lymph nodes: Secondary | ICD-10-CM | POA: Diagnosis not present

## 2023-04-21 DIAGNOSIS — R5383 Other fatigue: Secondary | ICD-10-CM | POA: Diagnosis not present

## 2023-04-21 DIAGNOSIS — K5730 Diverticulosis of large intestine without perforation or abscess without bleeding: Secondary | ICD-10-CM | POA: Diagnosis not present

## 2023-04-21 DIAGNOSIS — R0602 Shortness of breath: Secondary | ICD-10-CM | POA: Diagnosis not present

## 2023-04-21 DIAGNOSIS — H9190 Unspecified hearing loss, unspecified ear: Secondary | ICD-10-CM | POA: Diagnosis not present

## 2023-04-21 DIAGNOSIS — M549 Dorsalgia, unspecified: Secondary | ICD-10-CM | POA: Diagnosis not present

## 2023-04-21 DIAGNOSIS — R918 Other nonspecific abnormal finding of lung field: Secondary | ICD-10-CM | POA: Diagnosis not present

## 2023-04-21 DIAGNOSIS — K862 Cyst of pancreas: Secondary | ICD-10-CM | POA: Diagnosis not present

## 2023-04-21 NOTE — Telephone Encounter (Signed)
04/21/23 Spoke with patient and confirmed next appt.

## 2023-05-03 ENCOUNTER — Ambulatory Visit (HOSPITAL_COMMUNITY)
Admission: RE | Admit: 2023-05-03 | Discharge: 2023-05-03 | Disposition: A | Discharge status: Home / Self Care | Payer: Medicare Other | Source: Ambulatory Visit | Attending: Oncology | Admitting: Oncology

## 2023-05-03 DIAGNOSIS — M7989 Other specified soft tissue disorders: Secondary | ICD-10-CM | POA: Diagnosis not present

## 2023-05-03 DIAGNOSIS — M86471 Chronic osteomyelitis with draining sinus, right ankle and foot: Secondary | ICD-10-CM | POA: Insufficient documentation

## 2023-05-03 DIAGNOSIS — R600 Localized edema: Secondary | ICD-10-CM | POA: Diagnosis not present

## 2023-05-03 DIAGNOSIS — M19071 Primary osteoarthritis, right ankle and foot: Secondary | ICD-10-CM | POA: Diagnosis not present

## 2023-05-03 DIAGNOSIS — S91301A Unspecified open wound, right foot, initial encounter: Secondary | ICD-10-CM | POA: Diagnosis not present

## 2023-05-03 MED ORDER — GADOBUTROL 1 MMOL/ML IV SOLN
5.0000 mL | Freq: Once | INTRAVENOUS | Status: AC | PRN
  Administered 2023-05-03: 5 mL via INTRAVENOUS

## 2023-05-10 ENCOUNTER — Ambulatory Visit (HOSPITAL_COMMUNITY): Payer: Medicare Other

## 2023-05-11 DIAGNOSIS — G5601 Carpal tunnel syndrome, right upper limb: Secondary | ICD-10-CM | POA: Diagnosis not present

## 2023-05-12 ENCOUNTER — Inpatient Hospital Stay: Payer: Medicare Other

## 2023-05-12 ENCOUNTER — Other Ambulatory Visit: Payer: Self-pay | Admitting: Oncology

## 2023-05-12 ENCOUNTER — Encounter: Payer: Self-pay | Admitting: Oncology

## 2023-05-12 ENCOUNTER — Inpatient Hospital Stay: Payer: Medicare Other | Attending: Oncology | Admitting: Oncology

## 2023-05-12 ENCOUNTER — Other Ambulatory Visit: Payer: Self-pay | Admitting: Pharmacist

## 2023-05-12 ENCOUNTER — Telehealth: Payer: Self-pay | Admitting: Oncology

## 2023-05-12 VITALS — BP 115/74 | HR 68 | Temp 98.3°F | Resp 18 | Ht 60.0 in | Wt 103.6 lb

## 2023-05-12 DIAGNOSIS — C8213 Follicular lymphoma grade II, intra-abdominal lymph nodes: Secondary | ICD-10-CM

## 2023-05-12 DIAGNOSIS — A4901 Methicillin susceptible Staphylococcus aureus infection, unspecified site: Secondary | ICD-10-CM

## 2023-05-12 DIAGNOSIS — G629 Polyneuropathy, unspecified: Secondary | ICD-10-CM | POA: Diagnosis not present

## 2023-05-12 DIAGNOSIS — R918 Other nonspecific abnormal finding of lung field: Secondary | ICD-10-CM | POA: Insufficient documentation

## 2023-05-12 DIAGNOSIS — R0602 Shortness of breath: Secondary | ICD-10-CM | POA: Insufficient documentation

## 2023-05-12 DIAGNOSIS — R5383 Other fatigue: Secondary | ICD-10-CM | POA: Insufficient documentation

## 2023-05-12 DIAGNOSIS — M549 Dorsalgia, unspecified: Secondary | ICD-10-CM | POA: Diagnosis not present

## 2023-05-12 DIAGNOSIS — D803 Selective deficiency of immunoglobulin G [IgG] subclasses: Secondary | ICD-10-CM

## 2023-05-12 DIAGNOSIS — Z79899 Other long term (current) drug therapy: Secondary | ICD-10-CM | POA: Insufficient documentation

## 2023-05-12 DIAGNOSIS — D801 Nonfamilial hypogammaglobulinemia: Secondary | ICD-10-CM | POA: Insufficient documentation

## 2023-05-12 DIAGNOSIS — M86471 Chronic osteomyelitis with draining sinus, right ankle and foot: Secondary | ICD-10-CM | POA: Diagnosis not present

## 2023-05-12 DIAGNOSIS — D849 Immunodeficiency, unspecified: Secondary | ICD-10-CM | POA: Insufficient documentation

## 2023-05-12 DIAGNOSIS — K862 Cyst of pancreas: Secondary | ICD-10-CM | POA: Diagnosis not present

## 2023-05-12 LAB — CBC WITH DIFFERENTIAL (CANCER CENTER ONLY)
Abs Immature Granulocytes: 0.06 10*3/uL (ref 0.00–0.07)
Basophils Absolute: 0 10*3/uL (ref 0.0–0.1)
Basophils Relative: 0 %
Eosinophils Absolute: 0 10*3/uL (ref 0.0–0.5)
Eosinophils Relative: 0 %
HCT: 36.1 % (ref 36.0–46.0)
Hemoglobin: 12.2 g/dL (ref 12.0–15.0)
Immature Granulocytes: 1 %
Lymphocytes Relative: 10 %
Lymphs Abs: 1.1 10*3/uL (ref 0.7–4.0)
MCH: 29.5 pg (ref 26.0–34.0)
MCHC: 33.8 g/dL (ref 30.0–36.0)
MCV: 87.2 fL (ref 80.0–100.0)
Monocytes Absolute: 0.8 10*3/uL (ref 0.1–1.0)
Monocytes Relative: 7 %
Neutro Abs: 9.2 10*3/uL — ABNORMAL HIGH (ref 1.7–7.7)
Neutrophils Relative %: 82 %
Platelet Count: 196 10*3/uL (ref 150–400)
RBC: 4.14 MIL/uL (ref 3.87–5.11)
RDW: 16 % — ABNORMAL HIGH (ref 11.5–15.5)
WBC Count: 11.2 10*3/uL — ABNORMAL HIGH (ref 4.0–10.5)
nRBC: 0 % (ref 0.0–0.2)
nRBC: 0 /100{WBCs}

## 2023-05-12 LAB — CMP (CANCER CENTER ONLY)
ALT: 11 U/L (ref 0–44)
AST: 17 U/L (ref 15–41)
Albumin: 4.3 g/dL (ref 3.5–5.0)
Alkaline Phosphatase: 68 U/L (ref 38–126)
Anion gap: 10 (ref 5–15)
BUN: 24 mg/dL — ABNORMAL HIGH (ref 8–23)
CO2: 27 mmol/L (ref 22–32)
Calcium: 9.5 mg/dL (ref 8.9–10.3)
Chloride: 105 mmol/L (ref 98–111)
Creatinine: 1.16 mg/dL — ABNORMAL HIGH (ref 0.44–1.00)
GFR, Estimated: 44 mL/min — ABNORMAL LOW (ref >60)
Glucose, Bld: 113 mg/dL — ABNORMAL HIGH (ref 70–99)
Potassium: 3.9 mmol/L (ref 3.5–5.1)
Sodium: 142 mmol/L (ref 135–145)
Total Bilirubin: 0.4 mg/dL (ref 0.0–1.2)
Total Protein: 6.4 g/dL — ABNORMAL LOW (ref 6.5–8.1)

## 2023-05-12 MED ORDER — AMOXICILLIN-POT CLAVULANATE 875-125 MG PO TABS
1.0000 | ORAL_TABLET | Freq: Two times a day (BID) | ORAL | 0 refills | Status: DC
Start: 2023-05-12 — End: 2023-05-27

## 2023-05-12 NOTE — Telephone Encounter (Signed)
05/12/23 Scheduled next appt and confirmed with patient.

## 2023-05-12 NOTE — Progress Notes (Signed)
[facility]  [address] [place],  [place]  [address] [phone]  Clinic Day: 05/12/23  Referring physician: [name], MD  ASSESSMENT & PLAN:  Assessment: Follicular lymphoma grade II of intra-abdominal lymph nodes (HCC) CT chest, abdomen and pelvis in July 2022 revealed with stable lymphadenopathy, but a slight increase in the pulmonary nodules. We decided against further evaluation with PET. CT imaging from January 2023 is stable to slightly improved. Repeat CT from July 2023 is stable with decrease in the left peri-aortic node, but with mild increase in the lung nodules. Her last scan was in August. 2024 and there is not significant change in her new scan currently. CT chest, abdomen and pelvis done on 04/18/2023 revealed no significant change in her lymphoma. She has bilateral pulmonary nodules with the larger nodules measuring 2.9 x 2.2 cm in the right upper lobe, 4.2 x 2.0 cm in the right medial lobe, and 1.6 x 1.1 cm in the left lower lobe that are consistent with pulmonary lymphomatous involvement. There are unchanged matted appearing left retroperitoneal lymph nodes, and no new lymphadenopathy or soft tissue disease in the chest, abdomen, or pelvis.  Multiple Lung Nodules I suspect these are also lymphoma but we have not biopsied them to prove it. At least 3 are slowly but steadily enlarging consistent with progression of her lymphoma. We have held off on treatment due to her age and potential toxicities and she still remains largely asymptomatic from these lesions. We will let her heal from her osteomyelitis and think about options. We could biopsy one of these lesions but there is always the risk of complications. We can consider treatment with something fairly mild, but there still would be a risk of toxicities. For now we have decided on closer surveillance.   Pancreatic Cyst CT scan done on 04/18/2023 reveals an unchanged lobulated cystic lesion of the  pancreatic tail, measuring 2.3 x 1.2 cm, with distal ductal dilatation and atrophy were also found. This most likely reflects an IPMN. In view of her age and stability, we will not pursue further evaluation or treatment of this. We will monitor it.   Non-healing wound of anterior left ankle She has a history of multiple surgeries and skin grafting years ago but now has a persistent draining wound. She eventually went to the wound center and required admission to the hospital in August of 2024 with findings of osteomyelitis, she was on IV antibiotics and was treated for a prolonged period of time. Cultures revealed Staphylococcus Aureus, MSSA and repeat cultures from January 2025 still show Staph aureus. MRI of her right foot done on 05/03/2023 does not show osteomyelitis. This revealed no acute abnormality and resolved marrow edema in the anterior distal tibia and talus.   IgG Immunodeficiency Her IgG level is low at 529 with normal IgA and IgM. This is most likely due to her lymphoma but contributes to her immunodeficiency. In view of this chronic non-healing wound, I recommend monthly IVIG infusions for at least 2 months.   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 She went to a hand specialist yesterday to follow-up on this and received an injection and was advised to stop the Cipro. She was then placed on Gabapentin 100mg  but has not yet started. We revisited the options of receiving IVIG since her IgG was low at 529 on 03/31/2023. I have recommended that we give her IVIG infusions for at least  the next couple of months to help her healing of this chronic staphylococcal  wound. She reminded me that all the things on her allergy list she is not allergic too, she has previously tried to have this taken off her list and she denies allergy to penicillin. She has also previously taken Amoxicillin for her dentist appointments. I will place her on Augmentin  875mg  BID for 3 weeks. She had a MRI of her right foot done on 05/03/2023 to further evaluate for osteomyelitis. This revealed no acute abnormality, no evidence of osteomyelitis, and resolved marrow edema in the anterior distal tibia and talus.  She has a elevated WBC of 11.2 with an ANC of 9.2, hemoglobin of 12.2, and platelet count of 82,000. Her CMP is normal other than a elevated creatinine of 1.16 with a BUN of 24 and a low total protein of 6.4 improved from 6.2. Her SPEP and quantitative immunoglobulins today are pending. I advised for her to drink more fluids. I will see her back in 3 weeks with CBC and CMP. The patient and her son understand the plans discussed today and are in agreement with them.  They know to contact our office if she develops concerns prior to her next appointment.  I provided 22 minutes of face-to-face time during this this encounter and > 50% was spent counseling as documented under my assessment and plan.   Dellia Beckwith, MD  [facility] - A DEPT OF [facility] [address] [place] [address] Dept: [phone] Dept Fax: [phone]    No orders of the defined types were placed in this encounter.   CHIEF COMPLAINT:  CC: Follicular lymphoma Grade II follicular of intra-abdominal lymph nodes (HCC)  Current Treatment:  Observation  HISTORY OF PRESENT ILLNESS:   Oncology History  Follicular lymphoma grade II of intra-abdominal lymph nodes (HCC)  11/13/2019 Initial Diagnosis   Follicular lymphoma grade II of intra-abdominal lymph nodes (HCC)   12/14/2019 Cancer Staging   Staging form: Hodgkin and Non-Hodgkin Lymphoma, AJCC 8th Edition - Clinical stage from 12/14/2019: Stage I (Follicular lymphoma) - Signed by Dellia Beckwith, MD on 01/31/2020     INTERVAL HISTORY:  Julia Hunt is here for routine follow up for grade 2 follicular lymphoma of intra-abdominal lymph nodes (HCC). She was treated last  summer with prolonged antibiotics for persistent wound of the right anterior ankle/foot and cultures at that time revealed methicillin sensitive Staphylococcus Aureus. This organism was sensitive to Ciprofloxacin. Patient states that she feels ok but complains of back pain, fatigue, and weakness. She states her wound is starting to scab over and still has some drainage but improved. 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 She went to a hand specialist yesterday to follow-up on this and received an injection and was advised to stop the Cipro. She was then placed on Gabapentin 100mg  but has not yet started. We revisited the options of receiving IVIG since her IgG was low at 529 on 03/31/2023. I have recommended that we give her IVIG infusions for at least the next couple of months to help her healing of this chronic staphylococcal  wound. She reminded me that all the things on her allergy list she is not allergic too, she has previously tried to have this taken off her list and she denies allergy to penicillin. She has also previously taken Amoxicillin for her dentist appointments. I  will place her on Augmentin 875mg  BID for 3 weeks. She had a MRI of her right foot done on 05/03/2023 to further evaluate for osteomyelitis. This revealed no acute abnormality, no evidence of osteomyelitis, and resolved marrow edema in the anterior distal tibia and talus.  She has a elevated WBC of 11.2 with an ANC of 9.2, hemoglobin of 12.2, and platelet count of 82,000. Her CMP is normal other than a elevated creatinine of 1.16 with a BUN of 24 and a low total protein of 6.4 improved from 6.2. Her SPEP and quantitative immunoglobulins today are pending. I advised for her to drink more fluids. I will see her back in 3 weeks with CBC and CMP.  She denies signs of infection such as sore throat, sinus drainage, cough, or urinary symptoms.  She denies fevers or recurrent chills. She  denies nausea, vomiting, chest pain, dyspnea or cough. Her appetite is good and her weight has decreased 4 pounds over last 3 weeks . This patient is accompanied in the office by her son.   REVIEW OF SYSTEMS:  Review of Systems  Constitutional:  Positive for fatigue. Negative for appetite change, chills, diaphoresis, fever and unexpected weight change.  HENT:   Positive for hearing loss. Negative for lump/mass, mouth sores, nosebleeds, sore throat, tinnitus, trouble swallowing and voice change.   Eyes:  Positive for eye problems. Negative for icterus.  Respiratory:  Positive for shortness of breath (with exertion). Negative for chest tightness, cough, hemoptysis and wheezing.   Cardiovascular: Negative.  Negative for chest pain, leg swelling and palpitations.  Gastrointestinal: Negative.  Negative for abdominal distention, abdominal pain, blood in stool, constipation, diarrhea, nausea, rectal pain and vomiting.  Endocrine: Negative.   Genitourinary: Negative.  Negative for bladder incontinence, difficulty urinating, dyspareunia, dysuria, frequency, hematuria, menstrual problem, nocturia, pelvic pain, vaginal bleeding and vaginal discharge.   Musculoskeletal:  Positive for back pain (Chronic). Negative for arthralgias, flank pain, gait problem, myalgias, neck pain and neck stiffness.       She still has a persistent draining wound of the anterior right ankle but it does not appear purulent, warm, or erythematous. Cultures revealed staphylococcus, MSSA.   Pain in her right wrist/hand.  Skin:  Positive for wound (She still has a persistent draining wound of the anterior right ankle but it does not appear purulent, warm, or erythematous.). Negative for itching and rash.  Neurological:  Positive for numbness (first two fingers of the right hand). Negative for dizziness, extremity weakness, gait problem, headaches, light-headedness, seizures and speech difficulty.  Hematological: Negative.  Negative for  adenopathy. Does not bruise/bleed easily.  Psychiatric/Behavioral:  Positive for confusion (Decreased memory). Negative for decreased concentration, depression, sleep disturbance and suicidal ideas. The patient is not nervous/anxious.        Memory loss   VITALS:  Blood pressure 115/74, pulse 68, temperature 98.3 F (36.8 C), temperature source Oral, resp. rate 18, height 5' (1.524 m), weight 103 lb 9.6 oz (47 kg), SpO2 96%.  Wt Readings from Last 3 Encounters:  05/12/23 103 lb 9.6 oz (47 kg)  04/21/23 107 lb 9.6 oz (48.8 kg)  03/31/23 108 lb 11.2 oz (49.3 kg)    Body mass index is 20.23 kg/m.  Performance status (ECOG): 1 - Symptomatic but completely ambulatory  PHYSICAL EXAM:  Physical Exam Vitals and nursing note reviewed.  Constitutional:      General: She is not in acute distress.    Appearance: Normal appearance. She is normal weight. She is  not ill-appearing, toxic-appearing or diaphoretic.  HENT:     Head: Normocephalic and atraumatic.     Right Ear: Tympanic membrane, ear canal and external ear normal. There is no impacted cerumen.     Left Ear: Tympanic membrane, ear canal and external ear normal. There is no impacted cerumen.     Nose: Nose normal. No congestion or rhinorrhea.     Mouth/Throat:     Mouth: Mucous membranes are moist.     Pharynx: Oropharynx is clear. No oropharyngeal exudate or posterior oropharyngeal erythema.  Eyes:     General: No scleral icterus.       Right eye: No discharge.        Left eye: No discharge.     Extraocular Movements: Extraocular movements intact.     Conjunctiva/sclera: Conjunctivae normal.     Pupils: Pupils are equal, round, and reactive to light.  Neck:     Vascular: No carotid bruit.  Cardiovascular:     Rate and Rhythm: Normal rate and regular rhythm.     Pulses: Normal pulses.     Heart sounds: Normal heart sounds. No murmur heard.    No friction rub. No gallop.  Pulmonary:     Effort: Pulmonary effort is normal. No  respiratory distress.     Breath sounds: Normal breath sounds. No stridor. No wheezing, rhonchi or rales.  Chest:     Chest wall: No tenderness.     Comments: Pacemaker in her left upper chest. No masses in either breast Abdominal:     General: Bowel sounds are normal. There is no distension.     Palpations: Abdomen is soft. There is no hepatomegaly, splenomegaly or mass.     Tenderness: There is no abdominal tenderness. There is no right CVA tenderness, left CVA tenderness, guarding or rebound.     Hernia: No hernia is present.  Musculoskeletal:        General: No swelling, tenderness, deformity or signs of injury. Normal range of motion.     Cervical back: Normal range of motion and neck supple. No rigidity or tenderness.     Right lower leg: No edema.     Left lower leg: No edema.     Comments: Brace of the right leg with atrophy of both legs from prior Polio, left greater than right. Right foot wound is bandaged and she has a slight amount of exudate which appears serous with a diffuse non-healing wound of the anterior right ankle.   Feet:     Comments: Large wound about 3-4cm in length which has some eschar and no active drainage but surrounding induration and swelling.  Lymphadenopathy:     Cervical: No cervical adenopathy.     Right cervical: No superficial, deep or posterior cervical adenopathy.    Left cervical: No superficial, deep or posterior cervical adenopathy.     Upper Body:     Right upper body: No supraclavicular, axillary or pectoral adenopathy.     Left upper body: No supraclavicular, axillary or pectoral adenopathy.     Comments: Small node in the medial left supraclavicular fossa  Skin:    General: Skin is warm and dry.     Coloration: Skin is not jaundiced or pale.     Findings: No bruising, erythema, lesion or rash.     Comments: Swelling of the upper calf of the left lower extremity and redness of the anterior aspect with a blue vein in the center of this  area. I suspect  a superficial phlebitis.  Bruising of the skin coming down the anterior tibia.   Neurological:     General: No focal deficit present.     Mental Status: She is alert and oriented to person, place, and time. Mental status is at baseline.     Cranial Nerves: No cranial nerve deficit.     Sensory: No sensory deficit.     Motor: No weakness.     Coordination: Coordination normal.     Gait: Gait normal.     Deep Tendon Reflexes: Reflexes normal.  Psychiatric:        Mood and Affect: Mood normal.        Behavior: Behavior normal.        Thought Content: Thought content normal.        Judgment: Judgment normal.    LABS:      Latest Ref Rng & Units 05/12/2023    2:23 PM 03/31/2023    2:25 PM 11/09/2022   12:00 AM  CBC  WBC 4.0 - 10.5 K/uL 11.2  8.4  7.7      Hemoglobin 12.0 - 15.0 g/dL 16.1  09.6  04.5      Hematocrit 36.0 - 46.0 % 36.1  36.8  33      Platelets 150 - 400 K/uL 196  185  291         This result is from an external source.      Latest Ref Rng & Units 05/12/2023    2:23 PM 03/31/2023    2:25 PM 11/09/2022   12:00 AM  CMP  Glucose 70 - 99 mg/dL 409  811    BUN 8 - 23 mg/dL 24  19  17       Creatinine 0.44 - 1.00 mg/dL 9.14  7.82  0.9      Sodium 135 - 145 mmol/L 142  142  141      Potassium 3.5 - 5.1 mmol/L 3.9  4.0  4.6      Chloride 98 - 111 mmol/L 105  107  104      CO2 22 - 32 mmol/L 27  25  31       Calcium 8.9 - 10.3 mg/dL 9.5  9.7  9.4      Total Protein 6.5 - 8.1 g/dL 6.4  6.2    Total Bilirubin 0.0 - 1.2 mg/dL 0.4  0.4    Alkaline Phos 38 - 126 U/L 68  82  73      AST 15 - 41 U/L 17  13  33      ALT 0 - 44 U/L 11  6  14          This result is from an external source.   Lab Results  Component Value Date   CEA1 5.3 (H) 10/01/2020   /  CEA  Date Value Ref Range Status  10/01/2020 5.3 (H) 0.0 - 4.7 ng/mL Final    Comment:    (NOTE)                             Nonsmokers          <3.9                             Smokers              <5.6 [employer] Electrochemiluminescence Immunoassay (ECLIA) Values  obtained with different assay methods or kits cannot be used interchangeably.  Results cannot be interpreted as absolute evidence of the presence or absence of malignant disease. Performed At: [facility] [address] [place], [place] [id number] Jolene Schimke MD WU:[id number]    Lab Results  Component Value Date   LDH 181 07/16/2021   LDH 175 01/14/2021   LDH 549 10/01/2020    STUDIES:   EXAM: 04/18/2023 CT CHEST, ABDOMEN, AND PELVIS WITH CONTRAST IMPRESSION: 1. No significant change in bilateral pulmonary nodules, consistent with pulmonary lymphomatous involvement. 2. Unchanged matted appearing left retroperitoneal lymph nodes. 3. No new lymphadenopathy or soft tissue disease in the chest, abdomen, or pelvis. 4. Unchanged lobulated cystic lesion of the pancreatic tail, measuring 2.3 x 1.2 cm, with distal ductal dilatation and atrophy. This most likely reflects an IPMN, and despite suggestion of main duct involvement is of very doubtful clinical significance given advanced patient age greater than 29. Attention on follow-up. 5. Cardiomegaly and coronary artery disease. 6. Aortic Atherosclerosis (ICD10-I70.0).   HISTORY:   Allergies:  Allergies  Allergen Reactions   Clarithromycin Other (See Comments) and Rash    Pt states she is not allergic.   Sulfa Antibiotics Other (See Comments) and Rash    Pt states she is not allergic.    Current Medications: Current Outpatient Medications  Medication Sig Dispense Refill   amoxicillin-clavulanate (AUGMENTIN) 875-125 MG tablet Take 1 tablet by mouth 2 (two) times daily for 21 days. 42 tablet 0   Calcium Carbonate-Vitamin D 600-400 MG-UNIT tablet Take 1 tablet by mouth daily.     gabapentin (NEURONTIN) 100 MG capsule Take 1 capsule (100 mg total) by mouth at bedtime. Has not started this medication yet as of 05/12/23 30 capsule 5   levothyroxine  (SYNTHROID) 88 MCG tablet Take 88 mcg by mouth daily. Rotates it with every other day     loperamide (IMODIUM A-D) 2 MG tablet Take 2 mg by mouth as directed.     metoprolol succinate (TOPROL-XL) 50 MG 24 hr tablet TAKE 1 TABLET BY MOUTH DAILY. TAKE WITH OR IMMEDIATELY FOLLOWING A MEAL. 90 tablet 3   Multiple Vitamin (MULTIVITAMIN) capsule Take 1 capsule by mouth daily.     omeprazole (PRILOSEC) 20 MG capsule Take 20 mg by mouth as needed (heartburn).     sertraline (ZOLOFT) 100 MG tablet Take 100 mg by mouth daily.     traMADol (ULTRAM) 50 MG tablet Take 50 mg by mouth daily.   0   No current facility-administered medications for this visit.    I,Jasmine M Lassiter,acting as a scribe for Dellia Beckwith, MD.,have documented all relevant documentation on the behalf of Dellia Beckwith, MD,as directed by  Dellia Beckwith, MD while in the presence of Dellia Beckwith, MD.   I have reviewed this report as typed by the medical scribe, and it is complete and accurate.

## 2023-05-13 ENCOUNTER — Telehealth: Payer: Self-pay | Admitting: Oncology

## 2023-05-13 NOTE — Telephone Encounter (Signed)
Patient has been scheduled. Aware of appt date and time.   Scheduling Message Entered by Domenic Schwab on 05/13/2023 at 11:15 AM Priority: High <No visit type provided>  Department: [facility]  Provider:  Appointment Notes:  Please schedule pt for IVIG one day next week, then every 28 days.  No labs are required.

## 2023-05-14 ENCOUNTER — Other Ambulatory Visit: Payer: Self-pay | Admitting: Oncology

## 2023-05-14 DIAGNOSIS — G14 Postpolio syndrome: Secondary | ICD-10-CM

## 2023-05-14 LAB — IGG, IGA, IGM
IgA: 154 mg/dL (ref 64–422)
IgG (Immunoglobin G), Serum: 522 mg/dL — ABNORMAL LOW (ref 586–1602)
IgM (Immunoglobulin M), Srm: 90 mg/dL (ref 26–217)

## 2023-05-14 MED ORDER — GABAPENTIN 100 MG PO CAPS: 100.0000 mg | ORAL_CAPSULE | Freq: Every day | ORAL | 5 refills | Status: DC

## 2023-05-16 ENCOUNTER — Telehealth: Payer: Self-pay

## 2023-05-16 LAB — PROTEIN ELECTROPHORESIS, SERUM
A/G Ratio: 2 — ABNORMAL HIGH (ref 0.7–1.7)
Albumin ELP: 4.2 g/dL (ref 2.9–4.4)
Alpha-1-Globulin: 0.2 g/dL (ref 0.0–0.4)
Alpha-2-Globulin: 0.5 g/dL (ref 0.4–1.0)
Beta Globulin: 0.8 g/dL (ref 0.7–1.3)
Gamma Globulin: 0.5 g/dL (ref 0.4–1.8)
Globulin, Total: 2.1 g/dL — ABNORMAL LOW (ref 2.2–3.9)
Total Protein ELP: 6.3 g/dL (ref 6.0–8.5)

## 2023-05-16 NOTE — Telephone Encounter (Signed)
I attempted call to speak with pt's son and explain the reason she needs the IVIG infusion. The infusion will help her body heal her wounds and decrease infection episodes.

## 2023-05-19 ENCOUNTER — Encounter: Payer: Self-pay | Admitting: Oncology

## 2023-05-19 ENCOUNTER — Ambulatory Visit: Payer: Medicare Other

## 2023-05-24 ENCOUNTER — Ambulatory Visit (INDEPENDENT_AMBULATORY_CARE_PROVIDER_SITE_OTHER): Payer: Medicare Other

## 2023-05-24 DIAGNOSIS — R55 Syncope and collapse: Secondary | ICD-10-CM

## 2023-05-25 LAB — CUP PACEART REMOTE DEVICE CHECK
Battery Remaining Longevity: 44 mo
Battery Remaining Percentage: 41 %
Battery Voltage: 2.96 V
Brady Statistic AP VP Percent: 52 %
Brady Statistic AP VS Percent: 7.1 %
Brady Statistic AS VP Percent: 5.6 %
Brady Statistic AS VS Percent: 35 %
Brady Statistic RA Percent Paced: 58 %
Brady Statistic RV Percent Paced: 57 %
Date Time Interrogation Session: 20250225020028
Implantable Lead Connection Status: 753985
Implantable Lead Connection Status: 753985
Implantable Lead Implant Date: 20190205
Implantable Lead Implant Date: 20190205
Implantable Lead Location: 753859
Implantable Lead Location: 753860
Implantable Pulse Generator Implant Date: 20190205
Lead Channel Impedance Value: 340 Ohm
Lead Channel Impedance Value: 490 Ohm
Lead Channel Pacing Threshold Amplitude: 0.5 V
Lead Channel Pacing Threshold Amplitude: 0.625 V
Lead Channel Pacing Threshold Pulse Width: 0.5 ms
Lead Channel Pacing Threshold Pulse Width: 0.5 ms
Lead Channel Sensing Intrinsic Amplitude: 2.2 mV
Lead Channel Sensing Intrinsic Amplitude: 5.1 mV
Lead Channel Setting Pacing Amplitude: 0.875
Lead Channel Setting Pacing Amplitude: 2 V
Lead Channel Setting Pacing Pulse Width: 0.5 ms
Lead Channel Setting Sensing Sensitivity: 2 mV
Pulse Gen Model: 2272
Pulse Gen Serial Number: 8993229

## 2023-05-27 ENCOUNTER — Inpatient Hospital Stay (HOSPITAL_BASED_OUTPATIENT_CLINIC_OR_DEPARTMENT_OTHER): Payer: Medicare Other | Admitting: Oncology

## 2023-05-27 ENCOUNTER — Inpatient Hospital Stay: Payer: Medicare Other

## 2023-05-27 ENCOUNTER — Other Ambulatory Visit: Payer: Medicare Other

## 2023-05-27 ENCOUNTER — Encounter: Payer: Self-pay | Admitting: Oncology

## 2023-05-27 VITALS — BP 131/59 | HR 67 | Temp 98.3°F | Resp 16 | Ht 60.0 in | Wt 110.3 lb

## 2023-05-27 DIAGNOSIS — K862 Cyst of pancreas: Secondary | ICD-10-CM | POA: Diagnosis not present

## 2023-05-27 DIAGNOSIS — M549 Dorsalgia, unspecified: Secondary | ICD-10-CM | POA: Diagnosis not present

## 2023-05-27 DIAGNOSIS — Z79899 Other long term (current) drug therapy: Secondary | ICD-10-CM | POA: Diagnosis not present

## 2023-05-27 DIAGNOSIS — M86471 Chronic osteomyelitis with draining sinus, right ankle and foot: Secondary | ICD-10-CM | POA: Diagnosis not present

## 2023-05-27 DIAGNOSIS — R0602 Shortness of breath: Secondary | ICD-10-CM | POA: Diagnosis not present

## 2023-05-27 DIAGNOSIS — C8213 Follicular lymphoma grade II, intra-abdominal lymph nodes: Secondary | ICD-10-CM | POA: Diagnosis not present

## 2023-05-27 DIAGNOSIS — R918 Other nonspecific abnormal finding of lung field: Secondary | ICD-10-CM | POA: Diagnosis not present

## 2023-05-27 DIAGNOSIS — R5383 Other fatigue: Secondary | ICD-10-CM | POA: Diagnosis not present

## 2023-05-27 DIAGNOSIS — D849 Immunodeficiency, unspecified: Secondary | ICD-10-CM | POA: Diagnosis not present

## 2023-05-27 DIAGNOSIS — G629 Polyneuropathy, unspecified: Secondary | ICD-10-CM | POA: Diagnosis not present

## 2023-05-27 LAB — CMP (CANCER CENTER ONLY)
ALT: 12 U/L (ref 0–44)
AST: 18 U/L (ref 15–41)
Albumin: 3.9 g/dL (ref 3.5–5.0)
Alkaline Phosphatase: 71 U/L (ref 38–126)
Anion gap: 11 (ref 5–15)
BUN: 24 mg/dL — ABNORMAL HIGH (ref 8–23)
CO2: 24 mmol/L (ref 22–32)
Calcium: 9.1 mg/dL (ref 8.9–10.3)
Chloride: 107 mmol/L (ref 98–111)
Creatinine: 0.91 mg/dL (ref 0.44–1.00)
GFR, Estimated: 59 mL/min — ABNORMAL LOW (ref >60)
Glucose, Bld: 118 mg/dL — ABNORMAL HIGH (ref 70–99)
Potassium: 3.9 mmol/L (ref 3.5–5.1)
Sodium: 141 mmol/L (ref 135–145)
Total Bilirubin: 0.3 mg/dL (ref 0.0–1.2)
Total Protein: 5.9 g/dL — ABNORMAL LOW (ref 6.5–8.1)

## 2023-05-27 LAB — CBC WITH DIFFERENTIAL (CANCER CENTER ONLY)
Abs Immature Granulocytes: 0.04 10*3/uL (ref 0.00–0.07)
Basophils Absolute: 0 10*3/uL (ref 0.0–0.1)
Basophils Relative: 1 %
Eosinophils Absolute: 0.1 10*3/uL (ref 0.0–0.5)
Eosinophils Relative: 1 %
HCT: 35.2 % — ABNORMAL LOW (ref 36.0–46.0)
Hemoglobin: 11.7 g/dL — ABNORMAL LOW (ref 12.0–15.0)
Immature Granulocytes: 1 %
Lymphocytes Relative: 15 %
Lymphs Abs: 1 10*3/uL (ref 0.7–4.0)
MCH: 29.1 pg (ref 26.0–34.0)
MCHC: 33.2 g/dL (ref 30.0–36.0)
MCV: 87.6 fL (ref 80.0–100.0)
Monocytes Absolute: 0.4 10*3/uL (ref 0.1–1.0)
Monocytes Relative: 6 %
Neutro Abs: 5 10*3/uL (ref 1.7–7.7)
Neutrophils Relative %: 76 %
Platelet Count: 168 10*3/uL (ref 150–400)
RBC: 4.02 MIL/uL (ref 3.87–5.11)
RDW: 16.1 % — ABNORMAL HIGH (ref 11.5–15.5)
WBC Count: 6.5 10*3/uL (ref 4.0–10.5)
nRBC: 0 % (ref 0.0–0.2)
nRBC: 0 /100{WBCs}

## 2023-05-27 NOTE — Progress Notes (Signed)
 [facility]  [address] [place],  [place]  [address] [phone]  Clinic Day: 05/27/2023  Referring physician: [name], MD  ASSESSMENT & PLAN:  Assessment: Follicular lymphoma grade II of intra-abdominal lymph nodes (HCC) CT chest, abdomen and pelvis in July 2022 revealed with stable lymphadenopathy, but a slight increase in the pulmonary nodules. We decided against further evaluation with PET. CT imaging from January 2023 is stable to slightly improved. Repeat CT from July 2023 is stable with decrease in the left peri-aortic node, but with mild increase in the lung nodules. Her last scan was in August. 2024 and there is not significant change in her new scan currently. CT chest, abdomen and pelvis done on 04/18/2023 revealed no significant change in her lymphoma. She has bilateral pulmonary nodules with the larger nodules measuring 2.9 x 2.2 cm in the right upper lobe, 4.2 x 2.0 cm in the right medial lobe, and 1.6 x 1.1 cm in the left lower lobe that are consistent with pulmonary lymphomatous involvement. There are unchanged matted appearing left retroperitoneal lymph nodes, and no new lymphadenopathy or soft tissue disease in the chest, abdomen, or pelvis. She has decided she is not interested in any active therapy, understandable at her age.   Multiple Lung Nodules I suspect these are also lymphoma but we have not biopsied them to prove it. At least 3 are slowly but steadily enlarging consistent with progression of her lymphoma. We have held off on treatment due to her age and potential toxicities and she still remains largely asymptomatic from these lesions.    Pancreatic Cyst CT scan done on 04/18/2023 reveals an unchanged lobulated cystic lesion of the pancreatic tail, measuring 2.3 x 1.2 cm, with distal ductal dilatation and atrophy were also found. This most likely reflects an IPMN. In view of her age and stability, we will not pursue further evaluation or  treatment of this.   Non-healing wound of anterior left ankle She has a history of multiple surgeries and skin grafting years ago but now has a persistent draining wound. She eventually went to the wound center and required admission to the hospital in August of 2024 with findings of osteomyelitis, she was on IV antibiotics and was treated for a prolonged period of time. Cultures revealed Staphylococcus Aureus, MSSA and repeat cultures from January 2025 still show Staph aureus. MRI of her right foot done on 05/03/2023 does not show osteomyelitis. This revealed no acute abnormality and resolved marrow edema in the anterior distal tibia and talus. We have tried 2 different antibiotics and she does not tolerate them, so we will hold off on anything else.    IgG Immunodeficiency Her IgG level is low at 529 with normal IgA and IgM. This is most likely due to her lymphoma but contributes to her immunodeficiency.  Plan: She was treated last summer with prolonged antibiotics for persistent wound of the right anterior ankle/foot and cultures at that time revealed methicillin sensitive Staphylococcus Aureus (MSSA). This organism was sensitive to Ciprofloxacin but the patient had stomach upset with that so we tried Augmentin but it was hard to swallow and gave her severe diarrhea.  In August 2024 an MRI was questionable for osteomyelitis.  However, she states that Augmentin has helped heal her wound the best its been. She does not want to take further antibiotics and in fact does not want to take any medications. She canceled her IVIG infusion which we had recommended for her combined immunodeficiency. I  encouraged her to stay on her Zoloft and Synthyroid and continue to use tramadol as needed. I prescribed Gabapentin 100mg  at bedtime for her neuropathy and this has helped some so she will stay on it and we might consider increasing the dose. She inquired about Hospice and what services they have so I reviewed this  with her and her son and answered their questions. She explains that she is "ready to leave this world" and would prefer someone who can help assist her at home. She remains quite independent and functional and her symptoms are minimal at this time. I do feel she is appropriate for Hospice and will refer her to [facility] and they will contact her. She has a WBC of 6.5, low hemoglobin of 11.7 down from 12.2, and platelet count of 168,000. Her CMP is normal other than a stable BUN of 24 and low total protein of 5.9. She does have a signed DNR. I will see her back in 1 month for reevaluation.  The patient and her son understand the plans discussed today and are in agreement with them.  They know to contact our office if she develops concerns prior to her next appointment.  She reminded me that all the things on her allergy list she is not allergic too, she has previously tried to have this taken off her list and she denies allergy to penicillin. She has also previously taken Amoxicillin for her dentist appointments.  I provided 25 minutes of face-to-face time during this this encounter and > 50% was spent counseling as documented under my assessment and plan.   Dellia Beckwith, MD  [facility] - A DEPT OF [facility] [address] [place] [address] Dept: [phone] Dept Fax: [phone]    No orders of the defined types were placed in this encounter.   CHIEF COMPLAINT:  CC: Follicular lymphoma Grade II follicular of intra-abdominal lymph nodes (HCC)  Current Treatment:  Observation  HISTORY OF PRESENT ILLNESS:   Oncology History  Follicular lymphoma grade II of intra-abdominal lymph nodes (HCC)  11/13/2019 Initial Diagnosis   Follicular lymphoma grade II of intra-abdominal lymph nodes (HCC)   12/14/2019 Cancer Staging   Staging form: Hodgkin and Non-Hodgkin Lymphoma, AJCC 8th Edition - Clinical stage from 12/14/2019:  Stage I (Follicular lymphoma) - Signed by Dellia Beckwith, MD on 01/31/2020     INTERVAL HISTORY:  Julia Hunt is here for routine follow up for grade 2 follicular lymphoma of intra-abdominal lymph nodes (HCC) and lung. She was treated last summer with prolonged antibiotics for persistent wound of the right anterior ankle/foot and cultures at that time revealed methicillin sensitive Staphylococcus Aureus (MSSA).  An MRI scan at that time was equivocal for osteomyelitis.  The organism was sensitive to Ciprofloxacin but the patient had stomach upset with that recently, so we tried Augmentin but it was hard to swallow and gave her severe diarrhea. Patient states that she feels ok but complains of neuropathy of her fingers and diarrhea. However, she states that Augmentin has helped heal her wound the best it has been. She does not want to take further antibiotics and in fact does not want to take any medications. She canceled her IVIG infusion which we had recommended for her combined immunodeficiency. I encouraged her to stay on her Zoloft and Synthyroid and continue to use tramadol as needed. I prescribed Gabapentin 100mg  at bedtime for her neuropathy and this has helped  some so she will stay on it and we might consider increasing the dose. She inquired about Hospice and what services they do so I explained this to her and her son and answered their questions. She explains that she is "ready to leave this world" and would prefer someone who can help assist her at home. She remains quite independent and functional and her symptoms are minimal at this time. I do feel she is appropriate for Hospice and will refer her to [facility] and they will contact her. She has a WBC of 6.5, low hemoglobin of 11.7 down from 12.2, and platelet count of 168,000. Her CMP is normal other than a stable BUN of 24 and low total protein of 5.9. She does have a signed DNR. I will see her back in 1 month for reevaluation. She  denies signs of infection such as sore throat, sinus drainage, or urinary symptoms.  She denies fevers or recurrent chills. She denies pain. She denies nausea, vomiting, chest pain, dyspnea. Her appetite is good and her weight has increased 7 pounds over last 2 weeks . This patient is accompanied in the office by her son.   REVIEW OF SYSTEMS:  Review of Systems  Constitutional:  Positive for fatigue. Negative for appetite change, chills, diaphoresis, fever and unexpected weight change.  HENT:   Positive for hearing loss. Negative for lump/mass, mouth sores, nosebleeds, sore throat, tinnitus, trouble swallowing and voice change.   Eyes:  Positive for eye problems. Negative for icterus.  Respiratory:  Positive for cough and shortness of breath (with exertion). Negative for chest tightness, hemoptysis and wheezing.   Cardiovascular: Negative.  Negative for chest pain, leg swelling and palpitations.  Gastrointestinal:  Positive for diarrhea (on Augmentin). Negative for abdominal distention, abdominal pain, blood in stool, constipation, nausea, rectal pain and vomiting.  Endocrine: Negative.   Genitourinary: Negative.  Negative for bladder incontinence, difficulty urinating, dyspareunia, dysuria, frequency, hematuria, menstrual problem, nocturia, pelvic pain, vaginal bleeding and vaginal discharge.   Musculoskeletal:  Positive for back pain (Chronic) and gait problem. Negative for arthralgias, flank pain, myalgias, neck pain and neck stiffness.       She still has a persistent draining wound of the anterior right ankle but it does not appear purulent, warm, or erythematous. Cultures revealed staphylococcus, MSSA.   Pain in her right wrist/hand.  Skin:  Positive for wound (chronic, draining wound which is improved). Negative for itching and rash.  Neurological:  Positive for gait problem and numbness (first two fingers of the right hand). Negative for dizziness, extremity weakness, headaches,  light-headedness, seizures and speech difficulty.  Hematological: Negative.  Negative for adenopathy. Does not bruise/bleed easily.  Psychiatric/Behavioral:  Positive for confusion (Decreased memory). Negative for decreased concentration, depression, sleep disturbance and suicidal ideas. The patient is not nervous/anxious.        Memory loss   VITALS:  Blood pressure (!) 131/59, pulse 67, temperature 98.3 F (36.8 C), temperature source Oral, resp. rate 16, height 5' (1.524 m), weight 110 lb 4.8 oz (50 kg), SpO2 96%.  Wt Readings from Last 3 Encounters:  06/07/23 108 lb 0.4 oz (49 kg)  05/27/23 110 lb 4.8 oz (50 kg)  05/12/23 103 lb 9.6 oz (47 kg)    Body mass index is 21.54 kg/m.  Performance status (ECOG): 2 - Symptomatic, <50% confined to bed  PHYSICAL EXAM:  Physical Exam Vitals and nursing note reviewed. Exam conducted with a chaperone present.  Constitutional:  General: She is not in acute distress.    Appearance: Normal appearance. She is normal weight. She is not ill-appearing, toxic-appearing or diaphoretic.  HENT:     Head: Normocephalic and atraumatic.     Right Ear: Tympanic membrane, ear canal and external ear normal. There is no impacted cerumen.     Left Ear: Tympanic membrane, ear canal and external ear normal. There is no impacted cerumen.     Nose: Nose normal. No congestion or rhinorrhea.     Mouth/Throat:     Mouth: Mucous membranes are moist.     Pharynx: Oropharynx is clear. No oropharyngeal exudate or posterior oropharyngeal erythema.  Eyes:     General: No scleral icterus.       Right eye: No discharge.        Left eye: No discharge.     Extraocular Movements: Extraocular movements intact.     Conjunctiva/sclera: Conjunctivae normal.     Pupils: Pupils are equal, round, and reactive to light.  Neck:     Vascular: No carotid bruit.  Cardiovascular:     Rate and Rhythm: Normal rate and regular rhythm.     Pulses: Normal pulses.     Heart sounds:  Normal heart sounds. No murmur heard.    No friction rub. No gallop.  Pulmonary:     Effort: Pulmonary effort is normal. No respiratory distress.     Breath sounds: Normal breath sounds. No stridor. No wheezing, rhonchi or rales.  Chest:     Chest wall: No tenderness.     Comments: Pacemaker in her left upper chest. No masses in either breast Abdominal:     General: Bowel sounds are normal. There is no distension.     Palpations: Abdomen is soft. There is no hepatomegaly, splenomegaly or mass.     Tenderness: There is no abdominal tenderness. There is no right CVA tenderness, left CVA tenderness, guarding or rebound.     Hernia: No hernia is present.  Musculoskeletal:        General: No swelling, tenderness, deformity or signs of injury. Normal range of motion.     Cervical back: Normal range of motion and neck supple. No rigidity or tenderness.     Right lower leg: No edema.     Left lower leg: No edema.     Comments: Brace of the right leg with atrophy of both legs from prior Polio, left greater than right.  Feet:     Comments: Her whole leg is cold to the touch especially on the foot.  Mild edema and erythema of the medial aspect of the ankle with some clear serous drainage anteriorly.  Lymphadenopathy:     Cervical: No cervical adenopathy.     Right cervical: No superficial, deep or posterior cervical adenopathy.    Left cervical: No superficial, deep or posterior cervical adenopathy.     Upper Body:     Right upper body: No supraclavicular, axillary or pectoral adenopathy.     Left upper body: No supraclavicular, axillary or pectoral adenopathy.  Skin:    General: Skin is warm and dry.     Coloration: Skin is not jaundiced or pale.     Findings: No bruising, erythema, lesion or rash.     Comments: Swelling of the upper calf of the left lower extremity.  Neurological:     General: No focal deficit present.     Mental Status: She is alert and oriented to person, place, and  time. Mental  status is at baseline.     Cranial Nerves: No cranial nerve deficit.     Sensory: No sensory deficit.     Motor: No weakness.     Coordination: Coordination normal.     Gait: Gait normal.     Deep Tendon Reflexes: Reflexes normal.  Psychiatric:        Mood and Affect: Mood normal.        Behavior: Behavior normal.        Thought Content: Thought content normal.        Judgment: Judgment normal.    LABS:      Latest Ref Rng & Units 05/27/2023    3:10 PM 05/12/2023    2:23 PM 03/31/2023    2:25 PM  CBC  WBC 4.0 - 10.5 K/uL 6.5  11.2  8.4   Hemoglobin 12.0 - 15.0 g/dL 16.1  09.6  04.5   Hematocrit 36.0 - 46.0 % 35.2  36.1  36.8   Platelets 150 - 400 K/uL 168  196  185       Latest Ref Rng & Units 05/27/2023    3:10 PM 05/12/2023    2:23 PM 03/31/2023    2:25 PM  CMP  Glucose 70 - 99 mg/dL 409  811  914   BUN 8 - 23 mg/dL 24  24  19    Creatinine 0.44 - 1.00 mg/dL 7.82  9.56  2.13   Sodium 135 - 145 mmol/L 141  142  142   Potassium 3.5 - 5.1 mmol/L 3.9  3.9  4.0   Chloride 98 - 111 mmol/L 107  105  107   CO2 22 - 32 mmol/L 24  27  25    Calcium 8.9 - 10.3 mg/dL 9.1  9.5  9.7   Total Protein 6.5 - 8.1 g/dL 5.9  6.4  6.2   Total Bilirubin 0.0 - 1.2 mg/dL 0.3  0.4  0.4   Alkaline Phos 38 - 126 U/L 71  68  82   AST 15 - 41 U/L 18  17  13    ALT 0 - 44 U/L 12  11  6     Lab Results  Component Value Date   CEA1 5.3 (H) 10/01/2020   /  CEA  Date Value Ref Range Status  10/01/2020 5.3 (H) 0.0 - 4.7 ng/mL Final    Comment:    (NOTE)                             Nonsmokers          <3.9                             Smokers             <5.6 [employer] Electrochemiluminescence Immunoassay (ECLIA) Values obtained with different assay methods or kits cannot be used interchangeably.  Results cannot be interpreted as absolute evidence of the presence or absence of malignant disease. Performed At: [facility] [address] [place], [place]  [id number] Jolene Schimke MD GE:[id number]    Lab Results  Component Value Date   LDH 181 07/16/2021   LDH 175 01/14/2021   LDH 549 10/01/2020    STUDIES:   EXAM: 04/18/2023 CT CHEST, ABDOMEN, AND PELVIS WITH CONTRAST IMPRESSION: 1. No significant change in bilateral pulmonary nodules, consistent with pulmonary lymphomatous involvement. 2. Unchanged matted appearing left retroperitoneal  lymph nodes. 3. No new lymphadenopathy or soft tissue disease in the chest, abdomen, or pelvis. 4. Unchanged lobulated cystic lesion of the pancreatic tail, measuring 2.3 x 1.2 cm, with distal ductal dilatation and atrophy. This most likely reflects an IPMN, and despite suggestion of main duct involvement is of very doubtful clinical significance given advanced patient age greater than 55. Attention on follow-up. 5. Cardiomegaly and coronary artery disease. 6. Aortic Atherosclerosis (ICD10-I70.0).   HISTORY:   Allergies:  Allergies  Allergen Reactions   Clarithromycin Other (See Comments) and Rash    Pt states she is not allergic.   Penicillins Other (See Comments) and Rash    Pt states she is not allergic.   Sulfa Antibiotics Other (See Comments) and Rash    Pt states she is not allergic.    Current Medications: Current Outpatient Medications  Medication Sig Dispense Refill   Calcium Carbonate-Vitamin D 600-400 MG-UNIT tablet Take 1 tablet by mouth daily.     gabapentin (NEURONTIN) 100 MG capsule Take 1 capsule (100 mg total) by mouth at bedtime. Has not started this medication yet as of 05/12/23 30 capsule 5   levothyroxine (SYNTHROID) 88 MCG tablet Take 88 mcg by mouth daily. Rotates it with every other day     loperamide (IMODIUM A-D) 2 MG tablet Take 2 mg by mouth as directed.     metoprolol succinate (TOPROL-XL) 50 MG 24 hr tablet TAKE 1 TABLET BY MOUTH DAILY. TAKE WITH OR IMMEDIATELY FOLLOWING A MEAL. 90 tablet 3   Multiple Vitamin (MULTIVITAMIN) capsule Take 1 capsule by  mouth daily.     omeprazole (PRILOSEC) 20 MG capsule Take 20 mg by mouth as needed (heartburn).     oxyCODONE-acetaminophen (PERCOCET/ROXICET) 5-325 MG tablet Take 0.5 tablets by mouth every 8 (eight) hours as needed for severe pain (pain score 7-10). 6 tablet 0   sertraline (ZOLOFT) 100 MG tablet Take 100 mg by mouth daily.     traMADol (ULTRAM) 50 MG tablet Take 50 mg by mouth daily.   0   No current facility-administered medications for this visit.   Facility-Administered Medications Ordered in Other Visits  Medication Dose Route Frequency Provider Last Rate Last Admin   bacitracin ointment   Topical BID Eber Hong, MD        T[place] M Lassiter,acting as a scribe for Dellia Beckwith, MD.,have documented all relevant documentation on the behalf of Dellia Beckwith, MD,as directed by  Dellia Beckwith, MD while in the presence of Dellia Beckwith, MD.   I have reviewed this report as typed by the medical scribe, and it is complete and accurate.

## 2023-05-30 ENCOUNTER — Telehealth: Payer: Self-pay

## 2023-05-30 NOTE — Telephone Encounter (Signed)
 Referral sent to [facility].

## 2023-05-30 NOTE — Telephone Encounter (Signed)
-----   Message from Dellia Beckwith sent at 05/27/2023  4:32 PM EST ----- Regarding: hospice Refer [facility] - lymphoma of lung and abdomen, no treatment.  She has post polio syndrome and chronic draining wound of left ankle after many surgeries. Cultures show MSSA, not MRSA but she was unable to tolerate Cipro or Augmentin.  She is fairly functional and independent at this time. I want to continue thyroid, Zoloft, and gabapentin, with tramadol prn pain but uses infrequently

## 2023-06-02 ENCOUNTER — Ambulatory Visit: Payer: Medicare Other | Admitting: Oncology

## 2023-06-02 ENCOUNTER — Other Ambulatory Visit: Payer: Medicare Other

## 2023-06-02 DIAGNOSIS — M86661 Other chronic osteomyelitis, right tibia and fibula: Secondary | ICD-10-CM | POA: Diagnosis not present

## 2023-06-02 DIAGNOSIS — C8213 Follicular lymphoma grade II, intra-abdominal lymph nodes: Secondary | ICD-10-CM | POA: Diagnosis not present

## 2023-06-02 DIAGNOSIS — M21371 Foot drop, right foot: Secondary | ICD-10-CM | POA: Diagnosis not present

## 2023-06-07 ENCOUNTER — Encounter: Payer: Self-pay | Admitting: Oncology

## 2023-06-07 ENCOUNTER — Other Ambulatory Visit: Payer: Self-pay

## 2023-06-07 ENCOUNTER — Encounter (HOSPITAL_COMMUNITY): Payer: Self-pay | Admitting: Emergency Medicine

## 2023-06-07 ENCOUNTER — Emergency Department (HOSPITAL_COMMUNITY)
Admission: EM | Admit: 2023-06-07 | Discharge: 2023-06-07 | Disposition: A | Discharge status: Home / Self Care | Attending: Emergency Medicine | Admitting: Emergency Medicine

## 2023-06-07 ENCOUNTER — Other Ambulatory Visit: Payer: Self-pay | Admitting: Oncology

## 2023-06-07 ENCOUNTER — Emergency Department (HOSPITAL_COMMUNITY)

## 2023-06-07 DIAGNOSIS — S42032D Displaced fracture of lateral end of left clavicle, subsequent encounter for fracture with routine healing: Secondary | ICD-10-CM | POA: Diagnosis not present

## 2023-06-07 DIAGNOSIS — S51812A Laceration without foreign body of left forearm, initial encounter: Secondary | ICD-10-CM | POA: Diagnosis not present

## 2023-06-07 DIAGNOSIS — M4802 Spinal stenosis, cervical region: Secondary | ICD-10-CM | POA: Diagnosis not present

## 2023-06-07 DIAGNOSIS — R531 Weakness: Secondary | ICD-10-CM | POA: Diagnosis not present

## 2023-06-07 DIAGNOSIS — S42032A Displaced fracture of lateral end of left clavicle, initial encounter for closed fracture: Secondary | ICD-10-CM | POA: Diagnosis not present

## 2023-06-07 DIAGNOSIS — W19XXXA Unspecified fall, initial encounter: Secondary | ICD-10-CM | POA: Diagnosis not present

## 2023-06-07 DIAGNOSIS — M25512 Pain in left shoulder: Secondary | ICD-10-CM | POA: Diagnosis present

## 2023-06-07 DIAGNOSIS — S199XXA Unspecified injury of neck, initial encounter: Secondary | ICD-10-CM | POA: Diagnosis not present

## 2023-06-07 DIAGNOSIS — I6523 Occlusion and stenosis of bilateral carotid arteries: Secondary | ICD-10-CM | POA: Diagnosis not present

## 2023-06-07 DIAGNOSIS — M47812 Spondylosis without myelopathy or radiculopathy, cervical region: Secondary | ICD-10-CM | POA: Diagnosis not present

## 2023-06-07 DIAGNOSIS — Z8572 Personal history of non-Hodgkin lymphomas: Secondary | ICD-10-CM | POA: Diagnosis not present

## 2023-06-07 DIAGNOSIS — Y92007 Garden or yard of unspecified non-institutional (private) residence as the place of occurrence of the external cause: Secondary | ICD-10-CM | POA: Insufficient documentation

## 2023-06-07 DIAGNOSIS — W0110XA Fall on same level from slipping, tripping and stumbling with subsequent striking against unspecified object, initial encounter: Secondary | ICD-10-CM | POA: Diagnosis not present

## 2023-06-07 DIAGNOSIS — Y9301 Activity, walking, marching and hiking: Secondary | ICD-10-CM | POA: Diagnosis not present

## 2023-06-07 DIAGNOSIS — M778 Other enthesopathies, not elsewhere classified: Secondary | ICD-10-CM | POA: Diagnosis not present

## 2023-06-07 DIAGNOSIS — C8213 Follicular lymphoma grade II, intra-abdominal lymph nodes: Secondary | ICD-10-CM

## 2023-06-07 DIAGNOSIS — S4292XA Fracture of left shoulder girdle, part unspecified, initial encounter for closed fracture: Secondary | ICD-10-CM | POA: Diagnosis not present

## 2023-06-07 MED ORDER — BACITRACIN ZINC 500 UNIT/GM EX OINT
TOPICAL_OINTMENT | Freq: Two times a day (BID) | CUTANEOUS | Status: DC
Start: 2023-06-07 — End: 2023-06-07

## 2023-06-07 MED ORDER — OXYCODONE-ACETAMINOPHEN 5-325 MG PO TABS
1.0000 | ORAL_TABLET | Freq: Once | ORAL | Status: AC
  Administered 2023-06-07: 1 via ORAL
  Filled 2023-06-07: qty 1

## 2023-06-07 MED ORDER — OXYCODONE-ACETAMINOPHEN 5-325 MG PO TABS: 0.5000 | ORAL_TABLET | Freq: Three times a day (TID) | ORAL | 0 refills | Status: DC | PRN

## 2023-06-07 NOTE — ED Triage Notes (Signed)
 Pt BIBA from home w/ c/o fall. Pt was out front in her yard walking w/ her can where she had a mechanical fall un witnessed. Son was home and called 911. NO thinners or LOC. Only c/o L shoulder pain and L elbow skin tear which EMS has cleaned and bandaged already. Pt is A&O x4 and speaking full sentences

## 2023-06-07 NOTE — ED Provider Notes (Signed)
 [place] EMERGENCY DEPARTMENT AT [facility] Provider Note   CSN: [id number] Arrival date & time: 06/07/23  1554     History  Chief Complaint  Patient presents with   Marletta Lor    Julia Hunt is a 89 y.o. female.  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  Today she was walking out to the beautiful weather and lost her balance that she walked around the edge of a flower bed falling and striking her left shoulder.  She denies hitting her head, denies hitting her hip, she arrives by paramedic transport because of the pain in her left shoulder.  The patient is also being treated for non-Hodgkin's lymphoma but is not currently under any medication treatment and does not take any blood thinners.  Home Medications Prior to Admission medications   Medication Sig Start Date End Date Taking? Authorizing Provider  oxyCODONE-acetaminophen (PERCOCET/ROXICET) 5-325 MG tablet Take 0.5 tablets by mouth every 8 (eight) hours as needed for severe pain (pain score 7-10). 06/07/23  Yes Eber Hong, MD  Calcium Carbonate-Vitamin D 600-400 MG-UNIT tablet Take 1 tablet by mouth daily.    [provider]  gabapentin (NEURONTIN) 100 MG capsule Take 1 capsule (100 mg total) by mouth at bedtime. Has not started this medication yet as of 05/12/23 05/14/23   Dellia Beckwith, MD  levothyroxine (SYNTHROID) 88 MCG tablet Take 88 mcg by mouth daily. Rotates it with every other day 10/07/20   [provider]  loperamide (IMODIUM A-D) 2 MG tablet Take 2 mg by mouth as directed. 05/16/23   Dellia Beckwith, MD  metoprolol succinate (TOPROL-XL) 50 MG 24 hr tablet TAKE 1 TABLET BY MOUTH DAILY. TAKE WITH OR IMMEDIATELY FOLLOWING A MEAL. 11/10/22   Camnitz, Andree Coss, MD  Multiple Vitamin (MULTIVITAMIN) capsule Take 1  capsule by mouth daily.    [provider]  omeprazole (PRILOSEC) 20 MG capsule Take 20 mg by mouth as needed (heartburn). 07/27/20   [provider]  sertraline (ZOLOFT) 100 MG tablet Take 100 mg by mouth daily.    [provider]  traMADol (ULTRAM) 50 MG tablet Take 50 mg by mouth daily.  01/18/17   [provider]      Allergies    Clarithromycin, Penicillins, and Sulfa antibiotics    Review of Systems   Review of Systems  All other systems reviewed and are negative.   Physical Exam Updated Vital Signs BP (!) 157/50 (BP Location: Right Arm)   Pulse 62   Temp 99.1 F (37.3 C) (Oral)   Resp 16   Ht 1.524 m (5')   Wt 49 kg   SpO2 99%   BMI 21.10 kg/m  Physical Exam Vitals and nursing note reviewed.  Constitutional:      General: She is not in acute distress.    Appearance: She is well-developed.  HENT:     Head: Normocephalic and atraumatic.     Comments: No signs of head trauma    Mouth/Throat:     Pharynx: No oropharyngeal exudate.  Eyes:     General: No scleral icterus.       Right eye: No discharge.        Left eye: No discharge.     Conjunctiva/sclera: Conjunctivae normal.  Pupils: Pupils are equal, round, and reactive to light.  Neck:     Thyroid: No thyromegaly.     Vascular: No JVD.  Cardiovascular:     Rate and Rhythm: Normal rate and regular rhythm.     Heart sounds: Normal heart sounds. No murmur heard.    No friction rub. No gallop.  Pulmonary:     Effort: Pulmonary effort is normal. No respiratory distress.     Breath sounds: Normal breath sounds. No wheezing or rales.  Abdominal:     General: Bowel sounds are normal. There is no distension.     Palpations: Abdomen is soft. There is no mass.     Tenderness: There is no abdominal tenderness.  Musculoskeletal:        General: Tenderness present. Normal range of motion.     Cervical back: Normal range of motion and neck supple.     Comments: There is no  deformities of the left upper extremity or the bilateral lower extremities, they are at baseline including the dropfoot on the right.  Her left shoulder has good range of motion to both passive and active range of motion but tenderness laterally and posteriorly over the shoulder capsule.  There is no tenderness over the scapula the thoracic or the lumbar spines.  There is mild tenderness over the cervical spine and the left paraspinal muscles  Lymphadenopathy:     Cervical: No cervical adenopathy.  Skin:    General: Skin is warm and dry.     Findings: No erythema or rash.     Comments: Superficial skin tear to the left forearm, no lacerations in need of repair  Neurological:     Mental Status: She is alert.     Coordination: Coordination normal.  Psychiatric:        Behavior: Behavior normal.     ED Results / Procedures / Treatments   Labs (all labs ordered are listed, but only abnormal results are displayed) Labs Reviewed - No data to display  EKG None  Radiology CT Cervical Spine Wo Contrast Result Date: 06/07/2023 CLINICAL DATA:  Poly trauma, blunt. Unwitnessed mechanical fall in her front yard. EXAM: CT CERVICAL SPINE WITHOUT CONTRAST TECHNIQUE: Multidetector CT imaging of the cervical spine was performed without intravenous contrast. Multiplanar CT image reconstructions were also generated. RADIATION DOSE REDUCTION: This exam was performed according to the departmental dose-optimization program which includes automated exposure control, adjustment of the mA and/or kV according to patient size and/or use of iterative reconstruction technique. COMPARISON:  Cervical spine radiographs 10/11/2018 FINDINGS: Alignment: No significant listhesis is present. Straightening of the normal cervical lordosis is similar the prior study. Skull base and vertebrae: The craniocervical junction is normal. The vertebral body heights are normal. No acute fractures are present. Soft tissues and spinal canal:  No prevertebral fluid or swelling. No visible canal hematoma. Atherosclerotic calcifications are present in the carotid arteries bilaterally without definite stenosis. Medial deviation of the right internal carotid artery is noted. Disc levels: Uncovertebral spurring contributes to left greater than right foraminal narrowing at C4-5 and C6-7 and more symmetric bilateral moderate foraminal stenosis at C5-6. Upper chest: The lung apices are clear. The thoracic inlet is within normal limits. IMPRESSION: 1. No acute fracture or traumatic subluxation. 2. Degenerative changes of the cervical spine as described. 3. Atherosclerotic calcifications in the carotid arteries bilaterally without definite stenosis. Electronically Signed   By: Marin Roberts M.D.   On: 06/07/2023 18:16   DG Shoulder Left Result Date:  06/07/2023 CLINICAL DATA:  Pain after fall. EXAM: LEFT SHOULDER - 2+ VIEW; LEFT HUMERUS - 2+ VIEW COMPARISON:  None Available. FINDINGS: Shoulder: Displaced and comminuted fracture of the distal clavicle. Fracture is lateral to the coracoclavicular ligament insert shin. There is mild widening of the coracoclavicular joint space. No acromioclavicular widening. No glenohumeral dislocation. Mild glenohumeral degenerative spurring. Humerus: No acute fracture of the humerus. Elbow alignment is maintained. No focal soft tissue abnormalities. No fracture of included left ribs. IMPRESSION: 1. Displaced and comminuted fracture of the distal clavicle. 2. Mild widening of the coracoclavicular joint space. The acromioclavicular joint remains congruent. 3. No fracture of the humerus. Electronically Signed   By: Narda Rutherford M.D.   On: 06/07/2023 17:05   DG Humerus Left Result Date: 06/07/2023 CLINICAL DATA:  Pain after fall. EXAM: LEFT SHOULDER - 2+ VIEW; LEFT HUMERUS - 2+ VIEW COMPARISON:  None Available. FINDINGS: Shoulder: Displaced and comminuted fracture of the distal clavicle. Fracture is lateral to the  coracoclavicular ligament insert shin. There is mild widening of the coracoclavicular joint space. No acromioclavicular widening. No glenohumeral dislocation. Mild glenohumeral degenerative spurring. Humerus: No acute fracture of the humerus. Elbow alignment is maintained. No focal soft tissue abnormalities. No fracture of included left ribs. IMPRESSION: 1. Displaced and comminuted fracture of the distal clavicle. 2. Mild widening of the coracoclavicular joint space. The acromioclavicular joint remains congruent. 3. No fracture of the humerus. Electronically Signed   By: Narda Rutherford M.D.   On: 06/07/2023 17:05    Procedures Procedures    Medications Ordered in ED Medications  bacitracin ointment (has no administration in time range)  oxyCODONE-acetaminophen (PERCOCET/ROXICET) 5-325 MG per tablet 1 tablet (1 tablet Oral Given 06/07/23 1710)    ED Course/ Medical Decision Making/ A&P                                 Medical Decision Making Amount and/or Complexity of Data Reviewed Radiology: ordered.  Risk OTC drugs. Prescription drug management.    This patient presents to the ED for concern of trauma to the left arm differential diagnosis includes fracture location contusion, could be spinal injury as well    Additional history obtained:  Additional history obtained from medical record External records from outside source obtained and reviewed including on-call notes for non-Hodgkin's lymphoma   Lab Tests:  I Ordered, and personally interpreted labs.  The pertinent results include: Not indicated   Imaging Studies ordered:  I ordered imaging studies including x-rays of the humerus and shoulder on the left as well as cervical spine I independently visualized and interpreted imaging which showed x-ray of the humerus shoulder and cervical spine all show only 1 thing and that is a distal clavicular fracture on the left I agree with the radiologist  interpretation   Medicines ordered and prescription drug management:  I ordered medication including bacitracin and a sterile dressing for small skin tear to the left forearm Reevaluation of the patient after these medicines showed that the patient stable I have reviewed the patients home medicines and have made adjustments as needed   Problem List / ED Course:  Patient is well-appearing, isolated clavicle fracture, son is at the bedside, they are both aware of the results and they are comfortable going home, they can follow-up outpatient, sling placed by nursing.  Neurovascularly intact   Social Determinants of Health:   Elderly  Final Clinical Impression(s) / ED Diagnoses Final diagnoses:  Traumatic closed displaced fracture of acromial end of clavicle with routine healing, left    Rx / DC Orders ED Discharge Orders          Ordered    oxyCODONE-acetaminophen (PERCOCET/ROXICET) 5-325 MG tablet  Every 8 hours PRN        06/07/23 1828              Eber Hong, MD 06/07/23 1830

## 2023-06-07 NOTE — ED Notes (Signed)
 Pt and son understood d/c instructions and when to return to the ED. Medications were taught and understood. Pts L arm was slinged by ortho tech and pts son left with her via wheelchair

## 2023-06-07 NOTE — Discharge Instructions (Signed)
 Wear the sling for comfort, you can follow-up with your family doctor, this will likely have some pain and discomfort over the next month, make sure that you are doing gentle range of motion every day, have your son help you with this.  Tylenol or ibuprofen for pain, for severe pain take a half a tablet of the Percocet every 8 hours but be aware that this can cause you to have constipation nausea and severe dizziness.  Do not walk and that you are using a walker or a cane or having help with your family member.  ER for severe or worsening symptoms  I would like for you to follow-up with your family doctor within 1 week for recheck

## 2023-06-09 ENCOUNTER — Ambulatory Visit: Payer: Medicare Other

## 2023-06-10 DIAGNOSIS — S42032A Displaced fracture of lateral end of left clavicle, initial encounter for closed fracture: Secondary | ICD-10-CM | POA: Diagnosis not present

## 2023-06-17 DIAGNOSIS — S42002A Fracture of unspecified part of left clavicle, initial encounter for closed fracture: Secondary | ICD-10-CM | POA: Diagnosis not present

## 2023-06-29 NOTE — Addendum Note (Signed)
 Addended by: Geralyn Flash D on: 06/29/2023 03:30 PM   Modules accepted: Orders

## 2023-06-29 NOTE — Progress Notes (Signed)
 Remote pacemaker transmission.

## 2023-07-08 DIAGNOSIS — H8113 Benign paroxysmal vertigo, bilateral: Secondary | ICD-10-CM | POA: Diagnosis not present

## 2023-07-11 DIAGNOSIS — S42002A Fracture of unspecified part of left clavicle, initial encounter for closed fracture: Secondary | ICD-10-CM | POA: Diagnosis not present

## 2023-07-13 DIAGNOSIS — H47011 Ischemic optic neuropathy, right eye: Secondary | ICD-10-CM | POA: Diagnosis not present

## 2023-08-02 DIAGNOSIS — S42002A Fracture of unspecified part of left clavicle, initial encounter for closed fracture: Secondary | ICD-10-CM | POA: Diagnosis not present

## 2023-08-23 ENCOUNTER — Ambulatory Visit (INDEPENDENT_AMBULATORY_CARE_PROVIDER_SITE_OTHER): Payer: Medicare Other

## 2023-08-23 DIAGNOSIS — R55 Syncope and collapse: Secondary | ICD-10-CM | POA: Diagnosis not present

## 2023-08-24 LAB — CUP PACEART REMOTE DEVICE CHECK
Battery Remaining Longevity: 42 mo
Battery Remaining Percentage: 38 %
Battery Voltage: 2.96 V
Brady Statistic AP VP Percent: 47 %
Brady Statistic AP VS Percent: 6.7 %
Brady Statistic AS VP Percent: 5.9 %
Brady Statistic AS VS Percent: 40 %
Brady Statistic RA Percent Paced: 53 %
Brady Statistic RV Percent Paced: 53 %
Date Time Interrogation Session: 20250527020014
Implantable Lead Connection Status: 753985
Implantable Lead Connection Status: 753985
Implantable Lead Implant Date: 20190205
Implantable Lead Implant Date: 20190205
Implantable Lead Location: 753859
Implantable Lead Location: 753860
Implantable Pulse Generator Implant Date: 20190205
Lead Channel Impedance Value: 340 Ohm
Lead Channel Impedance Value: 410 Ohm
Lead Channel Pacing Threshold Amplitude: 0.5 V
Lead Channel Pacing Threshold Amplitude: 0.625 V
Lead Channel Pacing Threshold Pulse Width: 0.5 ms
Lead Channel Pacing Threshold Pulse Width: 0.5 ms
Lead Channel Sensing Intrinsic Amplitude: 2.1 mV
Lead Channel Sensing Intrinsic Amplitude: 6.8 mV
Lead Channel Setting Pacing Amplitude: 0.875
Lead Channel Setting Pacing Amplitude: 2 V
Lead Channel Setting Pacing Pulse Width: 0.5 ms
Lead Channel Setting Sensing Sensitivity: 2 mV
Pulse Gen Model: 2272
Pulse Gen Serial Number: 8993229

## 2023-08-28 ENCOUNTER — Ambulatory Visit: Payer: Self-pay | Admitting: Cardiology

## 2023-08-30 NOTE — Progress Notes (Incomplete)
 [facility]  [address] [place],  [place]  [address] [phone]  Clinic Day: 08/30/23   Referring physician: Adrian Hopper, MD  ASSESSMENT & PLAN:  Assessment: Follicular lymphoma grade II of intra-abdominal lymph nodes (HCC) CT chest, abdomen and pelvis in July 2022 revealed with stable lymphadenopathy, but a slight increase in the pulmonary nodules. We decided against further evaluation with PET. CT imaging from January 2023 is stable to slightly improved. Repeat CT from July 2023 is stable with decrease in the left peri-aortic node, but with mild increase in the lung nodules. Her last scan was in August. 2024 and there is not significant change in her new scan currently. CT chest, abdomen and pelvis done on 04/18/2023 revealed no significant change in her lymphoma. She has bilateral pulmonary nodules with the larger nodules measuring 2.9 x 2.2 cm in the right upper lobe, 4.2 x 2.0 cm in the right medial lobe, and 1.6 x 1.1 cm in the left lower lobe that are consistent with pulmonary lymphomatous involvement. There are unchanged matted appearing left retroperitoneal lymph nodes, and no new lymphadenopathy or soft tissue disease in the chest, abdomen, or pelvis. She has decided she is not interested in any active therapy, understandable at her age.   Multiple Lung Nodules I suspect these are also lymphoma but we have not biopsied them to prove it. At least 3 are slowly but steadily enlarging consistent with progression of her lymphoma. We have held off on treatment due to her age and potential toxicities and she still remains largely asymptomatic from these lesions.    Pancreatic Cyst CT scan done on 04/18/2023 reveals an unchanged lobulated cystic lesion of the pancreatic tail, measuring 2.3 x 1.2 cm, with distal ductal dilatation and atrophy were also found. This most likely reflects an IPMN. In view of her age and stability, we will not pursue further evaluation or  treatment of this.   Non-healing wound of anterior left ankle She has a history of multiple surgeries and skin grafting years ago but now has a persistent draining wound. She eventually went to the wound center and required admission to the hospital in August of 2024 with findings of osteomyelitis, she was on IV antibiotics and was treated for a prolonged period of time. Cultures revealed Staphylococcus Aureus, MSSA and repeat cultures from January 2025 still show Staph aureus. MRI of her right foot done on 05/03/2023 does not show osteomyelitis. This revealed no acute abnormality and resolved marrow edema in the anterior distal tibia and talus. We have tried 2 different antibiotics and she does not tolerate them, so we will hold off on anything else.    IgG Immunodeficiency Her IgG level is low at 529 with normal IgA and IgM. This is most likely due to her lymphoma but contributes to her immunodeficiency.  Plan: She was treated last summer with prolonged antibiotics for persistent wound of the right anterior ankle/foot and cultures at that time revealed methicillin sensitive Staphylococcus Aureus (MSSA). This organism was sensitive to Ciprofloxacin  but the patient had stomach upset with that so we tried Augmentin  but it was hard to swallow and gave her severe diarrhea.  In August 2024 an MRI was questionable for osteomyelitis.  However, she states that Augmentin  has helped heal her wound the best its been. She does not want to take further antibiotics and in fact does not want to take any medications. She canceled her IVIG infusion which we had recommended for her combined immunodeficiency.  I encouraged her to stay on her Zoloft  and Synthyroid and continue to use tramadol  as needed. I prescribed Gabapentin  100mg  at bedtime for her neuropathy and this has helped some so she will stay on it and we might consider increasing the dose. She inquired about Hospice and what services they have so I reviewed this  with her and her son and answered their questions. She explains that she is "ready to leave this world" and would prefer someone who can help assist her at home. She remains quite independent and functional and her symptoms are minimal at this time. I do feel she is appropriate for Hospice and will refer her to [facility] and they will contact her. She has a WBC of 6.5, low hemoglobin of 11.7 down from 12.2, and platelet count of 168,000. Her CMP is normal other than a stable BUN of 24 and low total protein of 5.9. She does have a signed DNR. I will see her back in 1 month for reevaluation.  The patient and her son understand the plans discussed today and are in agreement with them.  They know to contact our office if she develops concerns prior to her next appointment.  She reminded me that all the things on her allergy list she is not allergic too, she has previously tried to have this taken off her list and she denies allergy to penicillin. She has also previously taken Amoxicillin  for her dentist appointments.  I provided 25 minutes of face-to-face time during this this encounter and > 50% was spent counseling as documented under my assessment and plan.   Nolia Baumgartner, MD  [facility] OF [facility] [address] [place] [address] Dept: [phone] Dept Fax: [phone]    No orders of the defined types were placed in this encounter.   CHIEF COMPLAINT:  CC: Follicular lymphoma Grade II follicular of intra-abdominal lymph nodes (HCC)  Current Treatment:  Observation  HISTORY OF PRESENT ILLNESS:   Oncology History  Follicular lymphoma grade II of intra-abdominal lymph nodes (HCC)  11/13/2019 Initial Diagnosis   Follicular lymphoma grade II of intra-abdominal lymph nodes (HCC)   12/14/2019 Cancer Staging   Staging form: Hodgkin and Non-Hodgkin Lymphoma, AJCC 8th Edition - Clinical stage from 12/14/2019:  Stage I (Follicular lymphoma) - Signed by Nolia Baumgartner, MD on 01/31/2020     INTERVAL HISTORY:  Julia Hunt is here for routine follow up for grade 2 follicular lymphoma of intra-abdominal lymph nodes (HCC) and lung. She was treated last summer with prolonged antibiotics for persistent wound of the right anterior ankle/foot and cultures at that time revealed methicillin sensitive Staphylococcus Aureus (MSSA).  An MRI scan at that time was equivocal for osteomyelitis.  The organism was sensitive to Ciprofloxacin  but the patient had stomach upset with that recently, so we tried Augmentin  but it was hard to swallow and gave her severe diarrhea. Patient states that she feels *** and ***.       She denies fever, chills, night sweats, or other signs of infection. She denies cardiorespiratory and gastrointestinal issues. She  denies pain. Her appetite is *** and Her weight {Weight change:10426}.  Patient states that she feels ok but complains of neuropathy of her fingers and diarrhea. However, she states that Augmentin  has helped heal her wound the best it has been. She does not want to take further antibiotics and in fact does not want to  take any medications. She canceled her IVIG infusion which we had recommended for her combined immunodeficiency. I encouraged her to stay on her Zoloft  and Synthyroid and continue to use tramadol  as needed. I prescribed Gabapentin  100mg  at bedtime for her neuropathy and this has helped some so she will stay on it and we might consider increasing the dose. She inquired about Hospice and what services they do so I explained this to her and her son and answered their questions. She explains that she is "ready to leave this world" and would prefer someone who can help assist her at home. She remains quite independent and functional and her symptoms are minimal at this time. I do feel she is appropriate for Hospice and will refer her to [facility] and they will contact  her. She has a WBC of 6.5, low hemoglobin of 11.7 down from 12.2, and platelet count of 168,000. Her CMP is normal other than a stable BUN of 24 and low total protein of 5.9. She does have a signed DNR. I will see her back in 1 month for reevaluation. She denies signs of infection such as sore throat, sinus drainage, or urinary symptoms.  She denies fevers or recurrent chills. She denies pain. She denies nausea, vomiting, chest pain, dyspnea. Her appetite is good and her weight has increased 7 pounds over last 2 weeks. This patient is accompanied in the office by her son.   REVIEW OF SYSTEMS:  Review of Systems  Constitutional:  Positive for fatigue. Negative for appetite change, chills, diaphoresis, fever and unexpected weight change.  HENT:   Positive for hearing loss. Negative for lump/mass, mouth sores, nosebleeds, sore throat, tinnitus, trouble swallowing and voice change.   Eyes:  Positive for eye problems. Negative for icterus.  Respiratory:  Positive for cough and shortness of breath (with exertion). Negative for chest tightness, hemoptysis and wheezing.   Cardiovascular: Negative.  Negative for chest pain, leg swelling and palpitations.  Gastrointestinal:  Positive for diarrhea (on Augmentin ). Negative for abdominal distention, abdominal pain, blood in stool, constipation, nausea, rectal pain and vomiting.  Endocrine: Negative.   Genitourinary: Negative.  Negative for bladder incontinence, difficulty urinating, dyspareunia, dysuria, frequency, hematuria, menstrual problem, nocturia, pelvic pain, vaginal bleeding and vaginal discharge.   Musculoskeletal:  Positive for back pain (Chronic) and gait problem. Negative for arthralgias, flank pain, myalgias, neck pain and neck stiffness.       She still has a persistent draining wound of the anterior right ankle but it does not appear purulent, warm, or erythematous. Cultures revealed staphylococcus, MSSA.   Pain in her right wrist/hand.  Skin:   Positive for wound (chronic, draining wound which is improved). Negative for itching and rash.  Neurological:  Positive for gait problem and numbness (first two fingers of the right hand). Negative for dizziness, extremity weakness, headaches, light-headedness, seizures and speech difficulty.  Hematological: Negative.  Negative for adenopathy. Does not bruise/bleed easily.  Psychiatric/Behavioral:  Positive for confusion (Decreased memory). Negative for decreased concentration, depression, sleep disturbance and suicidal ideas. The patient is not nervous/anxious.        Memory loss   VITALS:  There were no vitals taken for this visit.  Wt Readings from Last 3 Encounters:  06/07/23 108 lb 0.4 oz (49 kg)  05/27/23 110 lb 4.8 oz (50 kg)  05/12/23 103 lb 9.6 oz (47 kg)    There is no height or weight on file to calculate BMI.  Performance status (ECOG): 2 -  Symptomatic, <50% confined to bed  PHYSICAL EXAM:  Physical Exam Vitals and nursing note reviewed. Exam conducted with a chaperone present.  Constitutional:      General: She is not in acute distress.    Appearance: Normal appearance. She is normal weight. She is not ill-appearing, toxic-appearing or diaphoretic.  HENT:     Head: Normocephalic and atraumatic.     Right Ear: Tympanic membrane, ear canal and external ear normal. There is no impacted cerumen.     Left Ear: Tympanic membrane, ear canal and external ear normal. There is no impacted cerumen.     Nose: Nose normal. No congestion or rhinorrhea.     Mouth/Throat:     Mouth: Mucous membranes are moist.     Pharynx: Oropharynx is clear. No oropharyngeal exudate or posterior oropharyngeal erythema.  Eyes:     General: No scleral icterus.       Right eye: No discharge.        Left eye: No discharge.     Extraocular Movements: Extraocular movements intact.     Conjunctiva/sclera: Conjunctivae normal.     Pupils: Pupils are equal, round, and reactive to light.  Neck:      Vascular: No carotid bruit.  Cardiovascular:     Rate and Rhythm: Normal rate and regular rhythm.     Pulses: Normal pulses.     Heart sounds: Normal heart sounds. No murmur heard.    No friction rub. No gallop.  Pulmonary:     Effort: Pulmonary effort is normal. No respiratory distress.     Breath sounds: Normal breath sounds. No stridor. No wheezing, rhonchi or rales.  Chest:     Chest wall: No tenderness.     Comments: Pacemaker in her left upper chest. No masses in either breast Abdominal:     General: Bowel sounds are normal. There is no distension.     Palpations: Abdomen is soft. There is no hepatomegaly, splenomegaly or mass.     Tenderness: There is no abdominal tenderness. There is no right CVA tenderness, left CVA tenderness, guarding or rebound.     Hernia: No hernia is present.  Musculoskeletal:        General: No swelling, tenderness, deformity or signs of injury. Normal range of motion.     Cervical back: Normal range of motion and neck supple. No rigidity or tenderness.     Right lower leg: No edema.     Left lower leg: No edema.     Comments: Brace of the right leg with atrophy of both legs from prior Polio, left greater than right.  Feet:     Comments: Her whole leg is cold to the touch especially on the foot.  Mild edema and erythema of the medial aspect of the ankle with some clear serous drainage anteriorly.  Lymphadenopathy:     Cervical: No cervical adenopathy.     Right cervical: No superficial, deep or posterior cervical adenopathy.    Left cervical: No superficial, deep or posterior cervical adenopathy.     Upper Body:     Right upper body: No supraclavicular, axillary or pectoral adenopathy.     Left upper body: No supraclavicular, axillary or pectoral adenopathy.  Skin:    General: Skin is warm and dry.     Coloration: Skin is not jaundiced or pale.     Findings: No bruising, erythema, lesion or rash.     Comments: Swelling of the upper calf of the  left lower  extremity.  Neurological:     General: No focal deficit present.     Mental Status: She is alert and oriented to person, place, and time. Mental status is at baseline.     Cranial Nerves: No cranial nerve deficit.     Sensory: No sensory deficit.     Motor: No weakness.     Coordination: Coordination normal.     Gait: Gait normal.     Deep Tendon Reflexes: Reflexes normal.  Psychiatric:        Mood and Affect: Mood normal.        Behavior: Behavior normal.        Thought Content: Thought content normal.        Judgment: Judgment normal.    LABS:      Latest Ref Rng & Units 05/27/2023    3:10 PM 05/12/2023    2:23 PM 03/31/2023    2:25 PM  CBC  WBC 4.0 - 10.5 K/uL 6.5  11.2  8.4   Hemoglobin 12.0 - 15.0 g/dL 40.9  81.1  91.4   Hematocrit 36.0 - 46.0 % 35.2  36.1  36.8   Platelets 150 - 400 K/uL 168  196  185       Latest Ref Rng & Units 05/27/2023    3:10 PM 05/12/2023    2:23 PM 03/31/2023    2:25 PM  CMP  Glucose 70 - 99 mg/dL 782  956  213   BUN 8 - 23 mg/dL 24  24  19    Creatinine 0.44 - 1.00 mg/dL 0.86  5.78  4.69   Sodium 135 - 145 mmol/L 141  142  142   Potassium 3.5 - 5.1 mmol/L 3.9  3.9  4.0   Chloride 98 - 111 mmol/L 107  105  107   CO2 22 - 32 mmol/L 24  27  25    Calcium  8.9 - 10.3 mg/dL 9.1  9.5  9.7   Total Protein 6.5 - 8.1 g/dL 5.9  6.4  6.2   Total Bilirubin 0.0 - 1.2 mg/dL 0.3  0.4  0.4   Alkaline Phos 38 - 126 U/L 71  68  82   AST 15 - 41 U/L 18  17  13    ALT 0 - 44 U/L 12  11  6     Lab Results  Component Value Date   CEA1 5.3 (H) 10/01/2020   /  CEA  Date Value Ref Range Status  10/01/2020 5.3 (H) 0.0 - 4.7 ng/mL Final    Comment:    (NOTE)                             Nonsmokers          <3.9                             Smokers             <5.6 [employer] Electrochemiluminescence Immunoassay (ECLIA) Values obtained with different assay methods or kits cannot be used interchangeably.  Results cannot be interpreted as absolute  evidence of the presence or absence of malignant disease. Performed At: [facility] [address] [place], [place] [id number] Pearlean Botts MD KG:[id number]    Lab Results  Component Value Date   LDH 181 07/16/2021   LDH 175 01/14/2021   LDH 549 10/01/2020     STUDIES:  EXAM: 04/18/2023 CT CHEST, ABDOMEN, AND PELVIS WITH CONTRAST IMPRESSION: 1. No significant change in bilateral pulmonary nodules, consistent with pulmonary lymphomatous involvement. 2. Unchanged matted appearing left retroperitoneal lymph nodes. 3. No new lymphadenopathy or soft tissue disease in the chest, abdomen, or pelvis. 4. Unchanged lobulated cystic lesion of the pancreatic tail, measuring 2.3 x 1.2 cm, with distal ductal dilatation and atrophy. This most likely reflects an IPMN, and despite suggestion of main duct involvement is of very doubtful clinical significance given advanced patient age greater than 55. Attention on follow-up. 5. Cardiomegaly and coronary artery disease. 6. Aortic Atherosclerosis (ICD10-I70.0).   HISTORY:   Allergies:  Allergies  Allergen Reactions   Clarithromycin Other (See Comments) and Rash    Pt states she is not allergic.   Penicillins Other (See Comments) and Rash    Pt states she is not allergic.   Sulfa Antibiotics Other (See Comments) and Rash    Pt states she is not allergic.    Current Medications: Current Outpatient Medications  Medication Sig Dispense Refill   Calcium  Carbonate-Vitamin D  600-400 MG-UNIT tablet Take 1 tablet by mouth daily.     gabapentin  (NEURONTIN ) 100 MG capsule Take 1 capsule (100 mg total) by mouth at bedtime. Has not started this medication yet as of 05/12/23 30 capsule 5   levothyroxine  (SYNTHROID ) 88 MCG tablet Take 88 mcg by mouth daily. Rotates it with 75mcg every other day     loperamide (IMODIUM A-D) 2 MG tablet Take 2 mg by mouth as directed.     metoprolol  succinate (TOPROL -XL) 50 MG 24 hr tablet TAKE 1 TABLET BY  MOUTH DAILY. TAKE WITH OR IMMEDIATELY FOLLOWING A MEAL. 90 tablet 3   Multiple Vitamin (MULTIVITAMIN) capsule Take 1 capsule by mouth daily.     omeprazole (PRILOSEC) 20 MG capsule Take 20 mg by mouth as needed (heartburn).     oxyCODONE -acetaminophen  (PERCOCET/ROXICET) 5-325 MG tablet Take 0.5 tablets by mouth every 8 (eight) hours as needed for severe pain (pain score 7-10). 6 tablet 0   sertraline  (ZOLOFT ) 100 MG tablet Take 100 mg by mouth daily.     traMADol  (ULTRAM ) 50 MG tablet Take 50 mg by mouth daily.   0   No current facility-administered medications for this visit.    I,Jasmine M Lassiter,acting as a scribe for Nolia Baumgartner, MD.,have documented all relevant documentation on the behalf of Nolia Baumgartner, MD,as directed by  Nolia Baumgartner, MD while in the presence of Nolia Baumgartner, MD.   I have reviewed this report as typed by the medical scribe, and it is complete and accurate.

## 2023-09-01 ENCOUNTER — Telehealth: Payer: Self-pay

## 2023-09-01 NOTE — Telephone Encounter (Signed)
 Called [facility] and she is on palliative services.

## 2023-09-01 NOTE — Telephone Encounter (Signed)
-----   Message from Nolia Baumgartner sent at 08/30/2023  7:20 AM EDT ----- Regarding: RE: Appt When I saw her 2/28, wanted to see her back in about 1 month.  But ref to hospice and I don't know what happened to f/u.  I need to see sometime this month with labs.  Is she on hospice? Never recall papers to sign ----- Message ----- From: Luevenia Saha Sent: 08/29/2023   3:10 PM EDT To: Nolia Baumgartner, MD; Earma Gloss, LPN Subject: Appt                                           Patient left message asking when she is scheduled to come back?

## 2023-09-06 ENCOUNTER — Inpatient Hospital Stay

## 2023-09-06 ENCOUNTER — Other Ambulatory Visit: Payer: Self-pay | Admitting: Hematology and Oncology

## 2023-09-06 ENCOUNTER — Inpatient Hospital Stay: Admitting: Oncology

## 2023-09-06 DIAGNOSIS — C8213 Follicular lymphoma grade II, intra-abdominal lymph nodes: Secondary | ICD-10-CM

## 2023-09-07 ENCOUNTER — Inpatient Hospital Stay: Attending: Hematology and Oncology

## 2023-09-07 ENCOUNTER — Inpatient Hospital Stay (HOSPITAL_BASED_OUTPATIENT_CLINIC_OR_DEPARTMENT_OTHER): Admitting: Hematology and Oncology

## 2023-09-07 ENCOUNTER — Encounter: Payer: Self-pay | Admitting: Hematology and Oncology

## 2023-09-07 ENCOUNTER — Telehealth: Payer: Self-pay | Admitting: Hematology and Oncology

## 2023-09-07 ENCOUNTER — Other Ambulatory Visit: Payer: Self-pay

## 2023-09-07 VITALS — BP 135/76 | HR 65 | Temp 98.7°F | Resp 16 | Ht 60.0 in | Wt 109.8 lb

## 2023-09-07 DIAGNOSIS — C8213 Follicular lymphoma grade II, intra-abdominal lymph nodes: Secondary | ICD-10-CM

## 2023-09-07 LAB — CMP (CANCER CENTER ONLY)
ALT: 9 U/L (ref 0–44)
AST: 17 U/L (ref 15–41)
Albumin: 4.4 g/dL (ref 3.5–5.0)
Alkaline Phosphatase: 89 U/L (ref 38–126)
Anion gap: 11 (ref 5–15)
BUN: 22 mg/dL (ref 8–23)
CO2: 26 mmol/L (ref 22–32)
Calcium: 9.6 mg/dL (ref 8.9–10.3)
Chloride: 106 mmol/L (ref 98–111)
Creatinine: 0.91 mg/dL (ref 0.44–1.00)
GFR, Estimated: 59 mL/min — ABNORMAL LOW (ref >60)
Glucose, Bld: 78 mg/dL (ref 70–99)
Potassium: 4.2 mmol/L (ref 3.5–5.1)
Sodium: 143 mmol/L (ref 135–145)
Total Bilirubin: 0.5 mg/dL (ref 0.0–1.2)
Total Protein: 6.3 g/dL — ABNORMAL LOW (ref 6.5–8.1)

## 2023-09-07 LAB — CBC WITH DIFFERENTIAL (CANCER CENTER ONLY)
Abs Immature Granulocytes: 0.07 10*3/uL (ref 0.00–0.07)
Basophils Absolute: 0 10*3/uL (ref 0.0–0.1)
Basophils Relative: 0 %
Eosinophils Absolute: 0.1 10*3/uL (ref 0.0–0.5)
Eosinophils Relative: 1 %
HCT: 37.2 % (ref 36.0–46.0)
Hemoglobin: 12.1 g/dL (ref 12.0–15.0)
Immature Granulocytes: 1 %
Lymphocytes Relative: 20 %
Lymphs Abs: 1.4 10*3/uL (ref 0.7–4.0)
MCH: 28.5 pg (ref 26.0–34.0)
MCHC: 32.5 g/dL (ref 30.0–36.0)
MCV: 87.5 fL (ref 80.0–100.0)
Monocytes Absolute: 0.5 10*3/uL (ref 0.1–1.0)
Monocytes Relative: 7 %
Neutro Abs: 5.1 10*3/uL (ref 1.7–7.7)
Neutrophils Relative %: 71 %
Platelet Count: 186 10*3/uL (ref 150–400)
RBC: 4.25 MIL/uL (ref 3.87–5.11)
RDW: 16.1 % — ABNORMAL HIGH (ref 11.5–15.5)
WBC Count: 7.2 10*3/uL (ref 4.0–10.5)
nRBC: 0 % (ref 0.0–0.2)

## 2023-09-07 NOTE — Progress Notes (Signed)
 [facility]  [address] [place],  [place]  [address] [phone]  Clinic Day: 09/07/2023  Referring physician: Adrian Hopper, MD  ASSESSMENT & PLAN:  Assessment: Follicular lymphoma grade II of intra-abdominal lymph nodes (HCC) CT chest, abdomen and pelvis in July 2022 revealed with stable lymphadenopathy, but a slight increase in the pulmonary nodules. We decided against further evaluation with PET. CT imaging from January 2023 is stable to slightly improved. Repeat CT from July 2023 is stable with decrease in the left peri-aortic node, but with mild increase in the lung nodules. Her last scan was in August. 2024 and there is not significant change in her new scan currently. CT chest, abdomen and pelvis done on 04/18/2023 revealed no significant change in her lymphoma. She has bilateral pulmonary nodules with the larger nodules measuring 2.9 x 2.2 cm in the right upper lobe, 4.2 x 2.0 cm in the right medial lobe, and 1.6 x 1.1 cm in the left lower lobe that are consistent with pulmonary lymphomatous involvement. There are unchanged matted appearing left retroperitoneal lymph nodes, and no new lymphadenopathy or soft tissue disease in the chest, abdomen, or pelvis. She has decided she is not interested in any active therapy, understandable at her age.   Multiple Lung Nodules We suspect these are also lymphoma but we have not biopsied them to prove it. At least 3 are slowly but steadily enlarging consistent with progression of her lymphoma. We have held off on treatment due to her age and potential toxicities and she still remains largely asymptomatic from these lesions.    Pancreatic Cyst CT scan done on 04/18/2023 reveals an unchanged lobulated cystic lesion of the pancreatic tail, measuring 2.3 x 1.2 cm, with distal ductal dilatation and atrophy were also found. This most likely reflects an IPMN. In view of her age and stability, we will not pursue further evaluation or  treatment of this.   Non-healing wound of anterior left ankle She has a history of multiple surgeries and skin grafting years ago but now has a persistent draining wound. She eventually went to the wound center and required admission to the hospital in August of 2024 with findings of osteomyelitis, she was on IV antibiotics and was treated for a prolonged period of time. Cultures revealed Staphylococcus Aureus, MSSA and repeat cultures from January 2025 still show Staph aureus. MRI of her right foot done on 05/03/2023 does not show osteomyelitis. This revealed no acute abnormality and resolved marrow edema in the anterior distal tibia and talus. We have tried 2 different antibiotics and she does not tolerate them, so we will hold off on anything else. Wound remains, now causing some pain. She is unsure if the pain could be from her brace as she notes it has shifted. She continues to decline antibiotic therapy.   IgG Immunodeficiency Her IgG level is low at 529 with normal IgA and IgM. This is most likely due to her lymphoma but contributes to her immunodeficiency.  Plan: She was treated last summer with prolonged antibiotics for persistent wound of the right anterior ankle/foot and cultures at that time revealed methicillin sensitive Staphylococcus Aureus (MSSA). This organism was sensitive to Ciprofloxacin  but the patient had stomach upset with that so we tried Augmentin  but it was hard to swallow and gave her severe diarrhea.  In August 2024 an MRI was questionable for osteomyelitis.  However, she states that Augmentin  has helped heal her wound the best its been. She does not want  to take further antibiotics and in fact does not want to take any medications. She canceled her IVIG infusion which we had recommended for her combined immunodeficiency and continues to decline infusions. She continues to stay on her Zoloft  and Synthyroid and continue to use tramadol  as needed. She is prescribed Gabapentin  100mg   at bedtime for her neuropathy and this has helped some so she will stay on it. She was evaluated by Hospice and did qualify for palliative care. She states no one has followed up since initial evaluation. I will look into this and see if she does in fact qualify and reach out to [facility]. She has a WBC of 6.5, low hemoglobin of 11.7 down from 12.2, and platelet count of 168,000. Her CMP is normal other than a stable BUN of 24 and low total protein of 5.9. She does have a signed DNR. I will see her back in 1 month for reevaluation.  The patient and her son understand the plans discussed today and are in agreement with them.  They know to contact our office if she develops concerns prior to her next appointment.  She reminded me that all the things on her allergy list she is not allergic too, she has previously tried to have this taken off her list and she denies allergy to penicillin. She has also previously taken Amoxicillin  for her dentist appointments.  I provided 30 minutes of face-to-face time during this this encounter and > 50% was spent counseling as documented under my assessment and plan.   Baldomero Bone, FNP-BC [facility] [place] - A DEPT OF [facility] [address] [place] [address] Dept: [phone] Dept Fax: [phone]    No orders of the defined types were placed in this encounter.   CHIEF COMPLAINT:  CC: Follicular lymphoma Grade II follicular of intra-abdominal lymph nodes (HCC)  Current Treatment:  Observation  HISTORY OF PRESENT ILLNESS:   Oncology History  Follicular lymphoma grade II of intra-abdominal lymph nodes (HCC)  11/13/2019 Initial Diagnosis   Follicular lymphoma grade II of intra-abdominal lymph nodes (HCC)   12/14/2019 Cancer Staging   Staging form: Hodgkin and Non-Hodgkin Lymphoma, AJCC 8th Edition - Clinical stage from 12/14/2019: Stage I (Follicular lymphoma) - Signed by Nolia Baumgartner, MD on 01/31/2020     INTERVAL HISTORY:  Julia Hunt is here for routine follow up for grade 2 follicular lymphoma of intra-abdominal lymph nodes (HCC) and lung. She was treated last summer with prolonged antibiotics for persistent wound of the right anterior ankle/foot and cultures at that time revealed methicillin sensitive Staphylococcus Aureus (MSSA).  An MRI scan at that time was equivocal for osteomyelitis.  The organism was sensitive to Ciprofloxacin  but the patient had stomach upset with that recently, so we tried Augmentin  but it was hard to swallow and gave her severe diarrhea. Patient states that she feels ok but complains of neuropathy of her fingers and diarrhea. However, she states that Augmentin  has helped heal her wound the best it has been. She does not want to take further antibiotics and in fact does not want to take any medications. She canceled her IVIG infusion which we had recommended for her combined immunodeficiency. She has stayed on her Zoloft  and Synthyroid and continue to use tramadol  as needed. She was prescribed Gabapentin  100mg  at bedtime for her neuropathy and this has helped some so she will stay on it and we might consider increasing the dose.  REVIEW OF SYSTEMS:  Review of Systems  Constitutional:  Positive for fatigue. Negative for appetite change, chills, diaphoresis, fever and unexpected weight change.  HENT:   Positive for hearing loss. Negative for lump/mass, mouth sores, nosebleeds, sore throat, tinnitus, trouble swallowing and voice change.   Eyes:  Positive for eye problems. Negative for icterus.  Respiratory:  Positive for cough and shortness of breath (with exertion). Negative for chest tightness, hemoptysis and wheezing.   Cardiovascular: Negative.  Negative for chest pain, leg swelling and palpitations.  Gastrointestinal:  Positive for diarrhea (on Augmentin ). Negative for abdominal distention, abdominal pain, blood in stool, constipation, nausea,  rectal pain and vomiting.  Endocrine: Negative.   Genitourinary: Negative.  Negative for bladder incontinence, difficulty urinating, dyspareunia, dysuria, frequency, hematuria, menstrual problem, nocturia, pelvic pain, vaginal bleeding and vaginal discharge.   Musculoskeletal:  Positive for back pain (Chronic) and gait problem. Negative for arthralgias, flank pain, myalgias, neck pain and neck stiffness.       She still has a persistent draining wound of the anterior right ankle but it does not appear purulent, warm, or erythematous. Cultures revealed staphylococcus, MSSA.   Pain in her right wrist/hand.  Skin:  Positive for wound (chronic, draining wound which is improved). Negative for itching and rash.  Neurological:  Positive for gait problem and numbness (first two fingers of the right hand). Negative for dizziness, extremity weakness, headaches, light-headedness, seizures and speech difficulty.  Hematological: Negative.  Negative for adenopathy. Does not bruise/bleed easily.  Psychiatric/Behavioral:  Positive for confusion (Decreased memory). Negative for decreased concentration, depression, sleep disturbance and suicidal ideas. The patient is not nervous/anxious.        Memory loss   VITALS:  Blood pressure 135/76, pulse 65, temperature 98.7 F (37.1 C), temperature source Oral, resp. rate 16, height 5' (1.524 m), weight 109 lb 12.8 oz (49.8 kg), SpO2 96%.  Wt Readings from Last 3 Encounters:  09/07/23 109 lb 12.8 oz (49.8 kg)  06/07/23 108 lb 0.4 oz (49 kg)  05/27/23 110 lb 4.8 oz (50 kg)    Body mass index is 21.44 kg/m.  Performance status (ECOG): 2 - Symptomatic, <50% confined to bed  PHYSICAL EXAM:  Physical Exam Vitals and nursing note reviewed. Exam conducted with a chaperone present.  Constitutional:      General: She is not in acute distress.    Appearance: Normal appearance. She is normal weight. She is not ill-appearing, toxic-appearing or diaphoretic.  HENT:      Head: Normocephalic and atraumatic.     Right Ear: Tympanic membrane, ear canal and external ear normal. There is no impacted cerumen.     Left Ear: Tympanic membrane, ear canal and external ear normal. There is no impacted cerumen.     Nose: Nose normal. No congestion or rhinorrhea.     Mouth/Throat:     Mouth: Mucous membranes are moist.     Pharynx: Oropharynx is clear. No oropharyngeal exudate or posterior oropharyngeal erythema.  Eyes:     General: No scleral icterus.       Right eye: No discharge.        Left eye: No discharge.     Extraocular Movements: Extraocular movements intact.     Conjunctiva/sclera: Conjunctivae normal.     Pupils: Pupils are equal, round, and reactive to light.  Neck:     Vascular: No carotid bruit.  Cardiovascular:     Rate and Rhythm: Normal rate and regular rhythm.     Pulses: Normal  pulses.     Heart sounds: Normal heart sounds. No murmur heard.    No friction rub. No gallop.  Pulmonary:     Effort: Pulmonary effort is normal. No respiratory distress.     Breath sounds: Normal breath sounds. No stridor. No wheezing, rhonchi or rales.  Chest:     Chest wall: No tenderness.     Comments: Pacemaker in her left upper chest. No masses in either breast Abdominal:     General: Bowel sounds are normal. There is no distension.     Palpations: Abdomen is soft. There is no hepatomegaly, splenomegaly or mass.     Tenderness: There is no abdominal tenderness. There is no right CVA tenderness, left CVA tenderness, guarding or rebound.     Hernia: No hernia is present.  Musculoskeletal:        General: No swelling, tenderness, deformity or signs of injury. Normal range of motion.     Cervical back: Normal range of motion and neck supple. No rigidity or tenderness.     Right lower leg: No edema.     Left lower leg: No edema.     Comments: Brace of the right leg with atrophy of both legs from prior Polio, left greater than right.  Feet:     Comments: Her  whole leg is cold to the touch especially on the foot.  Mild edema and erythema of the medial aspect of the ankle with some clear serous drainage anteriorly.  Lymphadenopathy:     Cervical: No cervical adenopathy.     Right cervical: No superficial, deep or posterior cervical adenopathy.    Left cervical: No superficial, deep or posterior cervical adenopathy.     Upper Body:     Right upper body: No supraclavicular, axillary or pectoral adenopathy.     Left upper body: No supraclavicular, axillary or pectoral adenopathy.  Skin:    General: Skin is warm and dry.     Coloration: Skin is not jaundiced or pale.     Findings: No bruising, erythema, lesion or rash.     Comments: Swelling of the upper calf of the left lower extremity.  Neurological:     General: No focal deficit present.     Mental Status: She is alert and oriented to person, place, and time. Mental status is at baseline.     Cranial Nerves: No cranial nerve deficit.     Sensory: No sensory deficit.     Motor: No weakness.     Coordination: Coordination normal.     Gait: Gait normal.     Deep Tendon Reflexes: Reflexes normal.  Psychiatric:        Mood and Affect: Mood normal.        Behavior: Behavior normal.        Thought Content: Thought content normal.        Judgment: Judgment normal.    LABS:      Latest Ref Rng & Units 09/07/2023    9:52 AM 05/27/2023    3:10 PM 05/12/2023    2:23 PM  CBC  WBC 4.0 - 10.5 K/uL 7.2  6.5  11.2   Hemoglobin 12.0 - 15.0 g/dL 16.1  09.6  04.5   Hematocrit 36.0 - 46.0 % 37.2  35.2  36.1   Platelets 150 - 400 K/uL 186  168  196       Latest Ref Rng & Units 09/07/2023    9:52 AM 05/27/2023    3:10 PM 05/12/2023  2:23 PM  CMP  Glucose 70 - 99 mg/dL 78  932  355   BUN 8 - 23 mg/dL 22  24  24    Creatinine 0.44 - 1.00 mg/dL 7.32  2.02  5.42   Sodium 135 - 145 mmol/L 143  141  142   Potassium 3.5 - 5.1 mmol/L 4.2  3.9  3.9   Chloride 98 - 111 mmol/L 106  107  105   CO2 22 - 32  mmol/L 26  24  27    Calcium  8.9 - 10.3 mg/dL 9.6  9.1  9.5   Total Protein 6.5 - 8.1 g/dL 6.3  5.9  6.4   Total Bilirubin 0.0 - 1.2 mg/dL 0.5  0.3  0.4   Alkaline Phos 38 - 126 U/L 89  71  68   AST 15 - 41 U/L 17  18  17    ALT 0 - 44 U/L 9  12  11     Lab Results  Component Value Date   CEA1 5.3 (H) 10/01/2020   /  CEA  Date Value Ref Range Status  10/01/2020 5.3 (H) 0.0 - 4.7 ng/mL Final    Comment:    (NOTE)                             Nonsmokers          <3.9                             Smokers             <5.6 [employer] Electrochemiluminescence Immunoassay (ECLIA) Values obtained with different assay methods or kits cannot be used interchangeably.  Results cannot be interpreted as absolute evidence of the presence or absence of malignant disease. Performed At: [facility] [address] [place], [place] [id number] Pearlean Botts MD [id number]    Lab Results  Component Value Date   LDH 181 07/16/2021   LDH 175 01/14/2021   LDH 549 10/01/2020    STUDIES:   EXAM: 04/18/2023 CT CHEST, ABDOMEN, AND PELVIS WITH CONTRAST IMPRESSION: 1. No significant change in bilateral pulmonary nodules, consistent with pulmonary lymphomatous involvement. 2. Unchanged matted appearing left retroperitoneal lymph nodes. 3. No new lymphadenopathy or soft tissue disease in the chest, abdomen, or pelvis. 4. Unchanged lobulated cystic lesion of the pancreatic tail, measuring 2.3 x 1.2 cm, with distal ductal dilatation and atrophy. This most likely reflects an IPMN, and despite suggestion of main duct involvement is of very doubtful clinical significance given advanced patient age greater than 91. Attention on follow-up. 5. Cardiomegaly and coronary artery disease. 6. Aortic Atherosclerosis (ICD10-I70.0).   HISTORY:   Allergies:  Allergies  Allergen Reactions   Clarithromycin Other (See Comments) and Rash    Pt states she is not allergic.   Penicillins Other (See  Comments) and Rash    Pt states she is not allergic.   Sulfa Antibiotics Other (See Comments) and Rash    Pt states she is not allergic.    Current Medications: Current Outpatient Medications  Medication Sig Dispense Refill   Calcium  Carbonate-Vitamin D  600-400 MG-UNIT tablet Take 1 tablet by mouth daily.     gabapentin  (NEURONTIN ) 100 MG capsule Take 1 capsule (100 mg total) by mouth at bedtime. Has not started this medication yet as of 05/12/23 30 capsule 5   levothyroxine  (SYNTHROID ) 88 MCG tablet Take 88  mcg by mouth daily. Rotates it with 75mcg every other day     metoprolol  succinate (TOPROL -XL) 50 MG 24 hr tablet TAKE 1 TABLET BY MOUTH DAILY. TAKE WITH OR IMMEDIATELY FOLLOWING A MEAL. 90 tablet 3   Multiple Vitamin (MULTIVITAMIN) capsule Take 1 capsule by mouth daily.     omeprazole (PRILOSEC) 20 MG capsule Take 20 mg by mouth as needed (heartburn).     sertraline  (ZOLOFT ) 100 MG tablet Take 100 mg by mouth daily.     traMADol  (ULTRAM ) 50 MG tablet Take 50 mg by mouth daily.   0   loperamide (IMODIUM A-D) 2 MG tablet Take 2 mg by mouth as directed.     No current facility-administered medications for this visit.

## 2023-09-07 NOTE — Telephone Encounter (Signed)
 Patient has been scheduled for follow-up visit per 09/07/23 LOS.  Pt aware of scheduled appt details.

## 2023-09-08 ENCOUNTER — Encounter: Payer: Self-pay | Admitting: Oncology

## 2023-09-16 DIAGNOSIS — M65341 Trigger finger, right ring finger: Secondary | ICD-10-CM | POA: Diagnosis not present

## 2023-10-05 NOTE — Progress Notes (Signed)
 [facility]  [address] [place],  [place]  [address] [phone]  Clinic Day: 10/06/23  Referring physician: Keren Vicenta BRAVO, MD  ASSESSMENT & PLAN:  Assessment: Follicular lymphoma grade II of intra-abdominal lymph nodes (HCC) CT chest, abdomen and pelvis in July 2022 revealed with stable lymphadenopathy, but a slight increase in the pulmonary nodules. We decided against further evaluation with PET. CT imaging from January 2023 is stable to slightly improved. Repeat CT from July 2023 is stable with decrease in the left peri-aortic node, but with mild increase in the lung nodules. Her last scan was in August. 2024 and there is not significant change in her new scan currently. CT chest, abdomen and pelvis done on 04/18/2023 revealed no significant change in her lymphoma. She has bilateral pulmonary nodules with the larger nodules measuring 2.9 x 2.2 cm in the right upper lobe, 4.2 x 2.0 cm in the right medial lobe, and 1.6 x 1.1 cm in the left lower lobe that are consistent with pulmonary lymphomatous involvement. There are unchanged matted appearing left retroperitoneal lymph nodes, and no new lymphadenopathy or soft tissue disease in the chest, abdomen, or pelvis. She has decided she is not interested in any active therapy, understandable at her age.   Multiple Lung Nodules I suspect these are also lymphoma but we have not biopsied them to prove it. At least 3 are slowly but steadily enlarging consistent with progression of her lymphoma. We have held off on treatment due to her age and potential toxicities and she still remains largely asymptomatic from these lesions.    Pancreatic Cyst CT scan done on 04/18/2023 reveals an unchanged lobulated cystic lesion of the pancreatic tail, measuring 2.3 x 1.2 cm, with distal ductal dilatation and atrophy were also found. This most likely reflects an IPMN. In view of her age and stability, we will not pursue further evaluation or  treatment of this.   Non-healing wound of anterior left ankle She has a history of multiple surgeries and skin grafting years ago but now has a persistent draining wound. She eventually went to the wound center and required admission to the hospital in August of 2024 with findings of osteomyelitis, she was on IV antibiotics and was treated for a prolonged period of time. Cultures revealed Staphylococcus Aureus, MSSA and repeat cultures from January 2025 still show Staph aureus. MRI of her right foot done on 05/03/2023 does not show osteomyelitis. This revealed no acute abnormality and resolved marrow edema in the anterior distal tibia and talus. Today I feel her wound is doing much better and I don't see signs of inflammation, the drainage has stopped.    IgG Immunodeficiency Her IgG level is low at 529 with normal IgA and IgM. This is most likely due to her lymphoma but contributes to her immunodeficiency. She has declined IVIG.   Plan: I instructed her to increase her Gabapentin  100 mg from once to two at bedtime. She informed me that on 06/07/2023 she had a major fall and fractured the acromial end of the left clavicle. She was prescribed percocet and advised to let it heal on its own.  She has a WBC of 6.0, low hemoglobin of 10.8 down from 12.1, and platelet count of 141,000.  Because of her anemia, we added labs to further evaluate. Her CMP is normal other than a low total protein of 6.0.I will see her back in 3 months with CBC and CMP.  The patient and her son understand  the plans discussed today and are in agreement with them.  They know to contact our office if she develops concerns prior to her next appointment.  She reminded me that all the things on her allergy list she is not allergic too, she has previously tried to have this taken off her list and she denies allergy to penicillin. She has also previously taken Amoxicillin  for her dentist appointments.  I provided 34 minutes of face-to-face  time during this this encounter and > 50% was spent counseling as documented under my assessment and plan.   Julia VEAR Cornish, MD  [facility] DEPT OF [facility] [address] [place] [address] Dept: [phone] Dept Fax: [phone]    No orders of the defined types were placed in this encounter.   CHIEF COMPLAINT:  CC: Follicular lymphoma Grade II follicular of intra-abdominal lymph nodes (HCC)  Current Treatment:  Observation  HISTORY OF PRESENT ILLNESS:   Oncology History  Follicular lymphoma grade II of intra-abdominal lymph nodes (HCC)  11/13/2019 Initial Diagnosis   Follicular lymphoma grade II of intra-abdominal lymph nodes (HCC)   12/14/2019 Cancer Staging   Staging form: Hodgkin and Non-Hodgkin Lymphoma, AJCC 8th Edition - Clinical stage from 12/14/2019: Stage I (Follicular lymphoma) - Signed by Hunt Julia VEAR, MD on 01/31/2020     INTERVAL HISTORY:  Julia Hunt is here for routine follow up for grade 2 follicular lymphoma of intra-abdominal lymph nodes (HCC) and lung. She was treated last summer with prolonged antibiotics for persistent wound of the right anterior ankle/foot and cultures at that time revealed methicillin sensitive Staphylococcus Aureus (MSSA).  An MRI scan at that time was equivocal for osteomyelitis.  The organism was sensitive to Ciprofloxacin  but the patient had stomach upset with that recently, so we tried Augmentin  but it was hard to swallow and gave her severe diarrhea. She canceled her IVIG infusion which we had recommended for her combined immunodeficiency. Patient states that she feels ok but complains of diffuse body pain, and burning left hand and wrist pain. I instructed her to increase her Gabapentin  100 mg from once to two at bedtime. She informed me that on 06/07/2023 she had a major fall and fractured the acromial end of the left clavicle. She was prescribed percocet and  advised to let it heal on its own.  She has a WBC of 6.0, low hemoglobin of 10.8 down from 12.1, and platelet count of 141,000.  Because of her anemia, we added labs to evaluate. Her CMP is normal other than a low total protein of 6.0.I will see her back in 3 months with CBC and CMP.   She denies fever, chills, night sweats, or other signs of infection. She denies cardiorespiratory and gastrointestinal issues. Her appetite is good and Her weight has decreased 2 pounds over last month. This patient is accompanied in the office by her son.   REVIEW OF SYSTEMS:  Review of Systems  Constitutional:  Positive for fatigue. Negative for appetite change, chills, diaphoresis, fever and unexpected weight change.  HENT:   Positive for hearing loss. Negative for lump/mass, mouth sores, nosebleeds, sore throat, tinnitus, trouble swallowing and voice change.   Eyes:  Positive for eye problems. Negative for icterus.  Respiratory:  Positive for shortness of breath (with exertion). Negative for chest tightness, cough, hemoptysis and wheezing.   Cardiovascular: Negative.  Negative for chest pain, leg swelling and palpitations.  Gastrointestinal:  Negative for  abdominal distention, abdominal pain, blood in stool, constipation, diarrhea, nausea, rectal pain and vomiting.  Endocrine: Negative.   Genitourinary: Negative.  Negative for bladder incontinence, difficulty urinating, dyspareunia, dysuria, frequency, hematuria, menstrual problem, nocturia, pelvic pain, vaginal bleeding and vaginal discharge.   Musculoskeletal:  Positive for back pain (Chronic) and gait problem. Negative for arthralgias, flank pain, myalgias, neck pain and neck stiffness.       Pain in her right wrist/hand with dupuytren contracture and neuropathy and arthritis.    Skin:  Positive for wound (chronic, draining wound which is improved). Negative for itching and rash.  Neurological:  Positive for gait problem and numbness (first two fingers of the  right hand). Negative for dizziness, extremity weakness, headaches, light-headedness, seizures and speech difficulty.  Hematological: Negative.  Negative for adenopathy. Does not bruise/bleed easily.  Psychiatric/Behavioral:  Positive for confusion (Decreased memory). Negative for decreased concentration, depression, sleep disturbance and suicidal ideas. The patient is not nervous/anxious.        Memory loss   VITALS:  Blood pressure 130/74, pulse 66, temperature 97.8 F (36.6 C), temperature source Oral, resp. rate 18, height 5' (1.524 m), weight 111 lb 4.8 oz (50.5 kg), SpO2 97%.  Wt Readings from Last 3 Encounters:  10/06/23 111 lb 4.8 oz (50.5 kg)  09/07/23 109 lb 12.8 oz (49.8 kg)  06/07/23 108 lb 0.4 oz (49 kg)    Body mass index is 21.74 kg/m.  Performance status (ECOG): 1 - Symptomatic but completely ambulatory  PHYSICAL EXAM:  Physical Exam Vitals and nursing note reviewed. Exam conducted with a chaperone present.  Constitutional:      General: She is not in acute distress.    Appearance: Normal appearance. She is normal weight. She is not ill-appearing, toxic-appearing or diaphoretic.  HENT:     Head: Normocephalic and atraumatic.     Right Ear: Tympanic membrane, ear canal and external ear normal. There is no impacted cerumen.     Left Ear: Tympanic membrane, ear canal and external ear normal. There is no impacted cerumen.     Nose: Nose normal. No congestion or rhinorrhea.     Mouth/Throat:     Mouth: Mucous membranes are moist.     Pharynx: Oropharynx is clear. No oropharyngeal exudate or posterior oropharyngeal erythema.  Eyes:     General: No scleral icterus.       Right eye: No discharge.        Left eye: No discharge.     Extraocular Movements: Extraocular movements intact.     Conjunctiva/sclera: Conjunctivae normal.     Pupils: Pupils are equal, round, and reactive to light.  Neck:     Vascular: No carotid bruit.  Cardiovascular:     Rate and Rhythm:  Normal rate and regular rhythm.     Pulses: Normal pulses.     Heart sounds: Normal heart sounds. No murmur heard.    No friction rub. No gallop.  Pulmonary:     Effort: Pulmonary effort is normal. No respiratory distress.     Breath sounds: Normal breath sounds. No stridor. No wheezing, rhonchi or rales.  Chest:     Chest wall: No tenderness.     Comments: Pacemaker in her left upper chest No masses in either breast Abdominal:     General: Bowel sounds are normal. There is no distension.     Palpations: Abdomen is soft. There is no hepatomegaly, splenomegaly or mass.     Tenderness: There is no abdominal tenderness. There  is no right CVA tenderness, left CVA tenderness, guarding or rebound.     Hernia: No hernia is present.     Comments: Lower mid abdominal incision Abdomen feels doughy and soft  Musculoskeletal:        General: No swelling, tenderness, deformity or signs of injury. Normal range of motion.     Cervical back: Normal range of motion and neck supple. No rigidity or tenderness.     Right lower leg: No edema.     Left lower leg: No edema.     Left ankle: Swelling present.     Right foot: Swelling present.     Comments: Brace of the right leg with atrophy of both legs from prior Polio, left < right.  Feet:     Comments: Her left upper ankle has some chronic edema Her left foot has 1+ edema. The wound of the anterior right ankle is dry without drainage and does not appear hot, warm, or erythematous.  There is an area of skin irritation from the adhesive Lymphadenopathy:     Cervical: No cervical adenopathy.     Right cervical: No superficial, deep or posterior cervical adenopathy.    Left cervical: No superficial, deep or posterior cervical adenopathy.     Upper Body:     Right upper body: No supraclavicular, axillary or pectoral adenopathy.     Left upper body: No supraclavicular, axillary or pectoral adenopathy.  Skin:    General: Skin is warm and dry.      Coloration: Skin is not jaundiced or pale.     Findings: No bruising, erythema, lesion or rash.  Neurological:     General: No focal deficit present.     Mental Status: She is alert and oriented to person, place, and time. Mental status is at baseline.     Cranial Nerves: No cranial nerve deficit.     Sensory: No sensory deficit.     Motor: No weakness.     Coordination: Coordination normal.     Gait: Gait normal.     Deep Tendon Reflexes: Reflexes normal.  Psychiatric:        Mood and Affect: Mood normal.        Behavior: Behavior normal.        Thought Content: Thought content normal.        Judgment: Judgment normal.    LABS:      Latest Ref Rng & Units 10/06/2023    2:10 PM 09/07/2023    9:52 AM 05/27/2023    3:10 PM  CBC  WBC 4.0 - 10.5 K/uL 6.0  7.2  6.5   Hemoglobin 12.0 - 15.0 g/dL 89.1  87.8  88.2   Hematocrit 36.0 - 46.0 % 33.0  37.2  35.2   Platelets 150 - 400 K/uL 141  186  168       Latest Ref Rng & Units 10/06/2023    2:10 PM 09/07/2023    9:52 AM 05/27/2023    3:10 PM  CMP  Glucose 70 - 99 mg/dL 89  78  881   BUN 8 - 23 mg/dL 24  22  24    Creatinine 0.44 - 1.00 mg/dL 9.17  9.08  9.08   Sodium 135 - 145 mmol/L 142  143  141   Potassium 3.5 - 5.1 mmol/L 4.2  4.2  3.9   Chloride 98 - 111 mmol/L 108  106  107   CO2 22 - 32 mmol/L 24  26  24  Calcium  8.9 - 10.3 mg/dL 9.3  9.6  9.1   Total Protein 6.5 - 8.1 g/dL 6.0  6.3  5.9   Total Bilirubin 0.0 - 1.2 mg/dL 0.3  0.5  0.3   Alkaline Phos 38 - 126 U/L 86  89  71   AST 15 - 41 U/L 18  17  18    ALT 0 - 44 U/L 12  9  12     Lab Results  Component Value Date   CEA1 5.3 (H) 10/01/2020   /  CEA  Date Value Ref Range Status  10/01/2020 5.3 (H) 0.0 - 4.7 ng/mL Final    Comment:    (NOTE)                             Nonsmokers          <3.9                             Smokers             <5.6 Roche [employer] Electrochemiluminescence Immunoassay (ECLIA) Values obtained with different assay methods or  kits cannot be used interchangeably.  Results cannot be interpreted as absolute evidence of the presence or absence of malignant disease. Performed At: [facility] [address] [place], [place] [id number] Jennette Shorter MD Ey:[phone]    Lab Results  Component Value Date   LDH 181 07/16/2021   LDH 175 01/14/2021   LDH 549 10/01/2020    STUDIES:   EXAM: 04/18/2023 CT CHEST, ABDOMEN, AND PELVIS WITH CONTRAST IMPRESSION: 1. No significant change in bilateral pulmonary nodules, consistent with pulmonary lymphomatous involvement. 2. Unchanged matted appearing left retroperitoneal lymph nodes. 3. No new lymphadenopathy or soft tissue disease in the chest, abdomen, or pelvis. 4. Unchanged lobulated cystic lesion of the pancreatic tail, measuring 2.3 x 1.2 cm, with distal ductal dilatation and atrophy. This most likely reflects an IPMN, and despite suggestion of main duct involvement is of very doubtful clinical significance given advanced patient age greater than 80. Attention on follow-up. 5. Cardiomegaly and coronary artery disease. 6. Aortic Atherosclerosis (ICD10-I70.0).   HISTORY:   Allergies:  Allergies  Allergen Reactions   Clarithromycin Other (See Comments) and Rash    Pt states she is not allergic.   Penicillins Other (See Comments) and Rash    Pt states she is not allergic.   Sulfa Antibiotics Other (See Comments) and Rash    Pt states she is not allergic.    Current Medications: Current Outpatient Medications  Medication Sig Dispense Refill   Calcium  Carbonate-Vitamin D  600-400 MG-UNIT tablet Take 1 tablet by mouth daily.     gabapentin  (NEURONTIN ) 100 MG capsule Take 2 capsules (200 mg total) by mouth at bedtime. Has not started this medication yet as of 05/12/23 60 capsule 5   levothyroxine  (SYNTHROID ) 88 MCG tablet Take 88 mcg by mouth daily. Rotates it with 75mcg every other day     loperamide (IMODIUM A-D) 2 MG tablet Take 2 mg by mouth as  directed.     meclizine (ANTIVERT) 25 MG tablet Take 25 mg by mouth 3 (three) times daily as needed.     metoprolol  succinate (TOPROL -XL) 50 MG 24 hr tablet TAKE 1 TABLET BY MOUTH DAILY. TAKE WITH OR IMMEDIATELY FOLLOWING A MEAL. 90 tablet 3   Multiple Vitamin (MULTIVITAMIN) capsule Take 1 capsule by mouth daily.  omeprazole (PRILOSEC) 20 MG capsule Take 20 mg by mouth as needed (heartburn).     sertraline  (ZOLOFT ) 100 MG tablet Take 100 mg by mouth daily.     traMADol  (ULTRAM ) 50 MG tablet Take 50 mg by mouth daily.   0   No current facility-administered medications for this visit.    I,Jasmine M Lassiter,acting as a scribe for Julia VEAR Cornish, MD.,have documented all relevant documentation on the behalf of Julia VEAR Cornish, MD,as directed by  Julia VEAR Cornish, MD while in the presence of Julia VEAR Cornish, MD.   I have reviewed this report as typed by the medical scribe, and it is complete and accurate.

## 2023-10-06 ENCOUNTER — Telehealth: Payer: Self-pay | Admitting: Oncology

## 2023-10-06 ENCOUNTER — Inpatient Hospital Stay: Attending: Hematology and Oncology | Admitting: Oncology

## 2023-10-06 ENCOUNTER — Other Ambulatory Visit: Payer: Self-pay | Admitting: Oncology

## 2023-10-06 ENCOUNTER — Encounter: Payer: Self-pay | Admitting: Oncology

## 2023-10-06 ENCOUNTER — Inpatient Hospital Stay

## 2023-10-06 VITALS — BP 130/74 | HR 66 | Temp 97.8°F | Resp 18 | Ht 60.0 in | Wt 111.3 lb

## 2023-10-06 DIAGNOSIS — R0602 Shortness of breath: Secondary | ICD-10-CM | POA: Insufficient documentation

## 2023-10-06 DIAGNOSIS — C8213 Follicular lymphoma grade II, intra-abdominal lymph nodes: Secondary | ICD-10-CM | POA: Insufficient documentation

## 2023-10-06 DIAGNOSIS — H9190 Unspecified hearing loss, unspecified ear: Secondary | ICD-10-CM | POA: Insufficient documentation

## 2023-10-06 DIAGNOSIS — K862 Cyst of pancreas: Secondary | ICD-10-CM | POA: Insufficient documentation

## 2023-10-06 DIAGNOSIS — D539 Nutritional anemia, unspecified: Secondary | ICD-10-CM | POA: Insufficient documentation

## 2023-10-06 DIAGNOSIS — Z79899 Other long term (current) drug therapy: Secondary | ICD-10-CM | POA: Insufficient documentation

## 2023-10-06 DIAGNOSIS — G14 Postpolio syndrome: Secondary | ICD-10-CM

## 2023-10-06 DIAGNOSIS — D649 Anemia, unspecified: Secondary | ICD-10-CM | POA: Insufficient documentation

## 2023-10-06 DIAGNOSIS — D849 Immunodeficiency, unspecified: Secondary | ICD-10-CM | POA: Insufficient documentation

## 2023-10-06 DIAGNOSIS — I2510 Atherosclerotic heart disease of native coronary artery without angina pectoris: Secondary | ICD-10-CM | POA: Diagnosis not present

## 2023-10-06 DIAGNOSIS — I517 Cardiomegaly: Secondary | ICD-10-CM | POA: Diagnosis not present

## 2023-10-06 DIAGNOSIS — D803 Selective deficiency of immunoglobulin G [IgG] subclasses: Secondary | ICD-10-CM | POA: Diagnosis not present

## 2023-10-06 DIAGNOSIS — R5383 Other fatigue: Secondary | ICD-10-CM | POA: Insufficient documentation

## 2023-10-06 DIAGNOSIS — R918 Other nonspecific abnormal finding of lung field: Secondary | ICD-10-CM | POA: Diagnosis not present

## 2023-10-06 DIAGNOSIS — I700 Atherosclerosis of aorta: Secondary | ICD-10-CM | POA: Insufficient documentation

## 2023-10-06 DIAGNOSIS — D519 Vitamin B12 deficiency anemia, unspecified: Secondary | ICD-10-CM

## 2023-10-06 LAB — CMP (CANCER CENTER ONLY)
ALT: 12 U/L (ref 0–44)
AST: 18 U/L (ref 15–41)
Albumin: 3.9 g/dL (ref 3.5–5.0)
Alkaline Phosphatase: 86 U/L (ref 38–126)
Anion gap: 10 (ref 5–15)
BUN: 24 mg/dL — ABNORMAL HIGH (ref 8–23)
CO2: 24 mmol/L (ref 22–32)
Calcium: 9.3 mg/dL (ref 8.9–10.3)
Chloride: 108 mmol/L (ref 98–111)
Creatinine: 0.82 mg/dL (ref 0.44–1.00)
GFR, Estimated: >60 mL/min (ref >60)
Glucose, Bld: 89 mg/dL (ref 70–99)
Potassium: 4.2 mmol/L (ref 3.5–5.1)
Sodium: 142 mmol/L (ref 135–145)
Total Bilirubin: 0.3 mg/dL (ref 0.0–1.2)
Total Protein: 6 g/dL — ABNORMAL LOW (ref 6.5–8.1)

## 2023-10-06 LAB — CBC WITH DIFFERENTIAL (CANCER CENTER ONLY)
Abs Immature Granulocytes: 0.02 K/uL (ref 0.00–0.07)
Basophils Absolute: 0 K/uL (ref 0.0–0.1)
Basophils Relative: 0 %
Eosinophils Absolute: 0.1 K/uL (ref 0.0–0.5)
Eosinophils Relative: 2 %
HCT: 33 % — ABNORMAL LOW (ref 36.0–46.0)
Hemoglobin: 10.8 g/dL — ABNORMAL LOW (ref 12.0–15.0)
Immature Granulocytes: 0 %
Lymphocytes Relative: 20 %
Lymphs Abs: 1.2 K/uL (ref 0.7–4.0)
MCH: 28.6 pg (ref 26.0–34.0)
MCHC: 32.7 g/dL (ref 30.0–36.0)
MCV: 87.3 fL (ref 80.0–100.0)
Monocytes Absolute: 0.4 K/uL (ref 0.1–1.0)
Monocytes Relative: 6 %
Neutro Abs: 4.3 K/uL (ref 1.7–7.7)
Neutrophils Relative %: 72 %
Platelet Count: 141 K/uL — ABNORMAL LOW (ref 150–400)
RBC: 3.78 MIL/uL — ABNORMAL LOW (ref 3.87–5.11)
RDW: 15.9 % — ABNORMAL HIGH (ref 11.5–15.5)
WBC Count: 6 K/uL (ref 4.0–10.5)
nRBC: 0 % (ref 0.0–0.2)

## 2023-10-06 MED ORDER — GABAPENTIN 100 MG PO CAPS: 200.0000 mg | ORAL_CAPSULE | Freq: Every day | ORAL | 5 refills | Status: DC

## 2023-10-06 NOTE — Telephone Encounter (Signed)
 Patient has been scheduled for follow-up visit per 10/06/23 LOS.  Pt given an appt calendar with date and time.

## 2023-10-07 LAB — IRON AND TIBC
Iron: 57 ug/dL (ref 28–170)
Saturation Ratios: 17 % (ref 10.4–31.8)
TIBC: 336 ug/dL (ref 250–450)
UIBC: 279 ug/dL

## 2023-10-07 LAB — FERRITIN: Ferritin: 83 ng/mL (ref 11–307)

## 2023-10-07 LAB — VITAMIN B12: Vitamin B-12: 191 pg/mL (ref 180–914)

## 2023-10-07 LAB — FOLATE: Folate: 14 ng/mL (ref >5.9)

## 2023-10-10 NOTE — Progress Notes (Signed)
 Remote pacemaker transmission.

## 2023-10-10 NOTE — Addendum Note (Signed)
 Addended by: TAWNI DRILLING D on: 10/10/2023 04:46 PM   Modules accepted: Orders

## 2023-10-20 ENCOUNTER — Telehealth: Payer: Self-pay

## 2023-10-20 NOTE — Telephone Encounter (Signed)
 Patient called for clarification on gabapentin  instructions. 2 capsules at bedtime.Patient voiced understanding.

## 2023-10-22 ENCOUNTER — Encounter: Payer: Self-pay | Admitting: Oncology

## 2023-10-22 DIAGNOSIS — D519 Vitamin B12 deficiency anemia, unspecified: Secondary | ICD-10-CM | POA: Insufficient documentation

## 2023-10-24 ENCOUNTER — Telehealth: Payer: Self-pay

## 2023-10-24 NOTE — Telephone Encounter (Signed)
-----   Message from Wanda VEAR Cornish sent at 10/22/2023  9:45 AM EDT ----- Regarding: B12 Tell her the B12 is low and I rec monthly injections, she can get here or through Dr. Keren. Send him the labs

## 2023-10-24 NOTE — Telephone Encounter (Signed)
 Attempted to contact patient. No answer and no VM.

## 2023-10-26 ENCOUNTER — Telehealth: Payer: Self-pay

## 2023-10-26 NOTE — Telephone Encounter (Signed)
-----   Message from Julia Hunt sent at 10/22/2023  9:45 AM EDT ----- Regarding: B12 Tell her the B12 is low and I rec monthly injections, she can get here or through Dr. Keren. Send him the labs

## 2023-10-26 NOTE — Telephone Encounter (Signed)
 Attempted to contact patient. No answer.

## 2023-11-05 ENCOUNTER — Other Ambulatory Visit: Payer: Self-pay | Admitting: Cardiology

## 2023-11-07 DIAGNOSIS — L97519 Non-pressure chronic ulcer of other part of right foot with unspecified severity: Secondary | ICD-10-CM | POA: Diagnosis not present

## 2023-11-07 DIAGNOSIS — G5691 Unspecified mononeuropathy of right upper limb: Secondary | ICD-10-CM | POA: Diagnosis not present

## 2023-11-08 ENCOUNTER — Ambulatory Visit

## 2023-11-08 ENCOUNTER — Encounter: Payer: Self-pay | Admitting: Podiatry

## 2023-11-08 ENCOUNTER — Ambulatory Visit: Admitting: Podiatry

## 2023-11-08 DIAGNOSIS — B957 Other staphylococcus as the cause of diseases classified elsewhere: Secondary | ICD-10-CM | POA: Diagnosis not present

## 2023-11-08 DIAGNOSIS — L97512 Non-pressure chronic ulcer of other part of right foot with fat layer exposed: Secondary | ICD-10-CM

## 2023-11-08 DIAGNOSIS — M21371 Foot drop, right foot: Secondary | ICD-10-CM

## 2023-11-08 DIAGNOSIS — M86471 Chronic osteomyelitis with draining sinus, right ankle and foot: Secondary | ICD-10-CM

## 2023-11-08 DIAGNOSIS — L080 Pyoderma: Secondary | ICD-10-CM | POA: Diagnosis not present

## 2023-11-08 MED ORDER — DOXYCYCLINE HYCLATE 100 MG PO TABS: 100.0000 mg | ORAL_TABLET | Freq: Two times a day (BID) | ORAL | 0 refills | Status: AC

## 2023-11-08 NOTE — Progress Notes (Unsigned)
 3 years Right anterior ankle On abx with mccarty most recently  0.4, 0.2, 0.3 distal has some purulence, middle and proximal is serous  Main area 0.4 x 0.4 x 0.5 ordering bordered foam with Aquacel Ag and barrier skin protectant.
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
## 2023-11-14 ENCOUNTER — Ambulatory Visit (HOSPITAL_COMMUNITY): Admission: RE | Admit: 2023-11-14 | Source: Ambulatory Visit

## 2023-11-14 ENCOUNTER — Other Ambulatory Visit (HOSPITAL_BASED_OUTPATIENT_CLINIC_OR_DEPARTMENT_OTHER): Admitting: Radiology

## 2023-11-14 NOTE — Progress Notes (Signed)
  Device system confirmed to be MRI conditional, with implant date > 6 weeks ago, and no evidence of abandoned or epicardial leads in review of most recent CXR   Device last cleared by EP Provider: Charlies Arthur PA 0//18/2025  Clearance is good through for 1 year as long as parameters remain stable at time of check. If pt undergoes a cardiac device procedure during that time, they should be re-cleared.   Tachy-therapies to be programmed off if applicable with device back to pre-MRI settings after completion of exam.  [employer] will be present for programming for the MRI.   Suzan Recardo Arna Debby  11/14/2023 12:02 PM

## 2023-11-15 ENCOUNTER — Ambulatory Visit (HOSPITAL_COMMUNITY)
Admission: RE | Admit: 2023-11-15 | Discharge: 2023-11-15 | Disposition: A | Discharge status: Home / Self Care | Source: Ambulatory Visit | Attending: Podiatry | Admitting: Podiatry

## 2023-11-15 DIAGNOSIS — M86471 Chronic osteomyelitis with draining sinus, right ankle and foot: Secondary | ICD-10-CM | POA: Diagnosis not present

## 2023-11-15 DIAGNOSIS — M86679 Other chronic osteomyelitis, unspecified ankle and foot: Secondary | ICD-10-CM | POA: Diagnosis not present

## 2023-11-15 DIAGNOSIS — R609 Edema, unspecified: Secondary | ICD-10-CM | POA: Diagnosis not present

## 2023-11-15 DIAGNOSIS — M19071 Primary osteoarthritis, right ankle and foot: Secondary | ICD-10-CM | POA: Diagnosis not present

## 2023-11-15 DIAGNOSIS — M25471 Effusion, right ankle: Secondary | ICD-10-CM | POA: Diagnosis not present

## 2023-11-15 NOTE — Progress Notes (Signed)
 Patient was monitored by this RN during MRI scan due to the presence of a pacemaker. Cardiac rhythm was continuously monitored throughout the procedure. Prior to the start of the scan, the pacemaker was placed in MRI-safe mode by the pacemaker representative. Following completion of the scan, the device was returned to its pre-MRI settings. Neurological status and orientation post-procedure were unchanged from baseline. Pre-procedure Heart Rate (Prior to being placed in MRI safe mode):60s Post-procedure Heart Rate (Once pacemaker is returned to baseline mode):63

## 2023-11-16 DIAGNOSIS — L97519 Non-pressure chronic ulcer of other part of right foot with unspecified severity: Secondary | ICD-10-CM | POA: Diagnosis not present

## 2023-11-20 NOTE — Progress Notes (Unsigned)
  Electrophysiology Office Note:   Date:  11/21/2023  ID:  LUCILL MAUCK, DOB [birth year], MRN [id number]  Primary Cardiologist: None Primary Heart Failure: None Electrophysiologist: Sharica Roedel Gladis Norton, MD      History of Present Illness:   SHALINA NORFOLK is a 89 y.o. female with h/o trifascicular block, syncope seen today for routine electrophysiology followup.   Since last being seen in our clinic the patient reports  increasing weakness and fatigue.  She overall is doing well, but does note that her blood pressure is low at times.  She is on metoprolol , but she does not know why.  She takes her metoprolol  in the mornings.  she denies chest pain, palpitations, dyspnea, PND, orthopnea, nausea, vomiting, dizziness, syncope, edema, weight gain, or early satiety.   Review of systems complete and found to be negative unless listed in HPI.      EP Information / Studies Reviewed:    EKG is ordered today. Personal review as below.  EKG Interpretation Date/Time:  Monday November 21 2023 10:06:14 EDT Ventricular Rate:  62 PR Interval:  260 QRS Duration:  126 QT Interval:  448 QTC Calculation: 454 R Axis:   -72  Text Interpretation: Sinus rhythm with 1st degree A-V block Right bundle branch block Left anterior fascicular block Bifascicular block T wave abnormality, consider lateral ischemia Abnormal ECG Confirmed by Ott Zimmerle ([id number]) on 11/21/2023 10:24:54 AM   PPM Interrogation-  reviewed in detail today,  See PACEART report.  Device History: Abbott Dual Chamber PPM implanted 05/03/2017 for Symptomatic bradycardia  Risk Assessment/Calculations:           Physical Exam:   VS:  BP 98/64   Pulse 62   Ht 5' (1.524 m)   Wt 103 lb 12.8 oz (47.1 kg) Comment: Has leg brace on that weighs 5-6 lbs per pt  SpO2 96%   BMI 20.27 kg/m    Wt Readings from Last 3 Encounters:  11/21/23 103 lb 12.8 oz (47.1 kg)  10/06/23 111 lb 4.8 oz (50.5 kg)  09/07/23 109 lb 12.8 oz (49.8 kg)      GEN: Well nourished, well developed in no acute distress NECK: No JVD; No carotid bruits CARDIAC: Regular rate and rhythm, no murmurs, rubs, gallops RESPIRATORY:  Clear to auscultation without rales, wheezing or rhonchi  ABDOMEN: Soft, non-tender, non-distended EXTREMITIES:  No edema; No deformity   ASSESSMENT AND PLAN:    Syncope with trifascicular block s/p Abbott PPM  Normal PPM function See Pace Art report No changes today  2.  Hypertension: Pressure is low today.  She feels weak and fatigued.  Shabreka Coulon have her hold her metoprolol  over the next few days.  If she feels better, we Jathniel Smeltzer continue to hold it.  If metoprolol  needs to be restarted, would start at a lower dose and have her take it at night.  Disposition:   Follow up with Dr. Norton 2 years   Signed, Osa Fogarty Gladis Norton, MD

## 2023-11-21 ENCOUNTER — Ambulatory Visit: Payer: Self-pay | Admitting: Cardiology

## 2023-11-21 ENCOUNTER — Encounter: Payer: Self-pay | Admitting: Cardiology

## 2023-11-21 ENCOUNTER — Ambulatory Visit: Attending: Cardiology | Admitting: Cardiology

## 2023-11-21 VITALS — BP 98/64 | HR 62 | Ht 60.0 in | Wt 103.8 lb

## 2023-11-21 DIAGNOSIS — R55 Syncope and collapse: Secondary | ICD-10-CM

## 2023-11-21 DIAGNOSIS — I452 Bifascicular block: Secondary | ICD-10-CM | POA: Diagnosis not present

## 2023-11-21 LAB — CUP PACEART INCLINIC DEVICE CHECK
Battery Remaining Longevity: 39 mo
Battery Voltage: 2.96 V
Brady Statistic RA Percent Paced: 55 %
Brady Statistic RV Percent Paced: 56 %
Date Time Interrogation Session: 20250825110729
Implantable Lead Connection Status: 753985
Implantable Lead Connection Status: 753985
Implantable Lead Implant Date: 20190205
Implantable Lead Implant Date: 20190205
Implantable Lead Location: 753859
Implantable Lead Location: 753860
Implantable Pulse Generator Implant Date: 20190205
Lead Channel Impedance Value: 337.5 Ohm
Lead Channel Impedance Value: 550 Ohm
Lead Channel Pacing Threshold Amplitude: 0.5 V
Lead Channel Pacing Threshold Amplitude: 0.5 V
Lead Channel Pacing Threshold Amplitude: 0.75 V
Lead Channel Pacing Threshold Amplitude: 0.75 V
Lead Channel Pacing Threshold Pulse Width: 0.5 ms
Lead Channel Pacing Threshold Pulse Width: 0.5 ms
Lead Channel Pacing Threshold Pulse Width: 0.5 ms
Lead Channel Pacing Threshold Pulse Width: 0.5 ms
Lead Channel Sensing Intrinsic Amplitude: 2.4 mV
Lead Channel Sensing Intrinsic Amplitude: 8.5 mV
Lead Channel Setting Pacing Amplitude: 0.875
Lead Channel Setting Pacing Amplitude: 2 V
Lead Channel Setting Pacing Pulse Width: 0.5 ms
Lead Channel Setting Sensing Sensitivity: 2 mV
Pulse Gen Model: 2272
Pulse Gen Serial Number: 8993229

## 2023-11-21 NOTE — Patient Instructions (Addendum)
 Medication Instructions:  Your physician has recommended you make the following change in your medication:  STOP Metoprolol  Succinate (Toprol )   *If you need a refill on your cardiac medications before your next appointment, please call your pharmacy*  Lab Work: None ordered   Testing/Procedures: None ordered  Follow-Up: At [facility], you and your health needs are our priority.  As part of our continuing mission to provide you with exceptional heart care, our providers are all part of one team.  This team includes your primary Cardiologist (physician) and Advanced Practice Providers or APPs (Physician Assistants and Nurse Practitioners) who all work together to provide you with the care you need, when you need it.  Your next appointment:   2 year(s)  Provider:   Soyla Norton, MD    We recommend signing up for the patient portal called MyChart.  Sign up information is provided on this After Visit Summary.  MyChart is used to connect with patients for Virtual Visits (Telemedicine).  Patients are able to view lab/test results, encounter notes, upcoming appointments, etc.  Non-urgent messages can be sent to your provider as well.   To learn more about what you can do with MyChart, go to [email or web address].   Thank you for choosing [facility]!!   Julia Domino, RN [phone]

## 2023-11-22 ENCOUNTER — Ambulatory Visit (INDEPENDENT_AMBULATORY_CARE_PROVIDER_SITE_OTHER): Payer: Medicare Other

## 2023-11-22 DIAGNOSIS — R55 Syncope and collapse: Secondary | ICD-10-CM

## 2023-11-23 ENCOUNTER — Ambulatory Visit: Payer: Self-pay | Admitting: Cardiology

## 2023-11-23 LAB — CUP PACEART REMOTE DEVICE CHECK
Battery Remaining Longevity: 41 mo
Battery Remaining Percentage: 35 %
Battery Voltage: 2.96 V
Brady Statistic AP VP Percent: 59 %
Brady Statistic AP VS Percent: 2.7 %
Brady Statistic AS VP Percent: 4.5 %
Brady Statistic AS VS Percent: 34 %
Brady Statistic RA Percent Paced: 62 %
Brady Statistic RV Percent Paced: 64 %
Date Time Interrogation Session: 20250826020015
Implantable Lead Connection Status: 753985
Implantable Lead Connection Status: 753985
Implantable Lead Implant Date: 20190205
Implantable Lead Implant Date: 20190205
Implantable Lead Location: 753859
Implantable Lead Location: 753860
Implantable Pulse Generator Implant Date: 20190205
Lead Channel Impedance Value: 350 Ohm
Lead Channel Impedance Value: 560 Ohm
Lead Channel Pacing Threshold Amplitude: 0.5 V
Lead Channel Pacing Threshold Amplitude: 0.625 V
Lead Channel Pacing Threshold Pulse Width: 0.5 ms
Lead Channel Pacing Threshold Pulse Width: 0.5 ms
Lead Channel Sensing Intrinsic Amplitude: 2.1 mV
Lead Channel Sensing Intrinsic Amplitude: 6.8 mV
Lead Channel Setting Pacing Amplitude: 0.875
Lead Channel Setting Pacing Amplitude: 2 V
Lead Channel Setting Pacing Pulse Width: 0.5 ms
Lead Channel Setting Sensing Sensitivity: 2 mV
Pulse Gen Model: 2272
Pulse Gen Serial Number: 8993229

## 2023-11-29 ENCOUNTER — Encounter: Payer: Self-pay | Admitting: Podiatry

## 2023-11-29 ENCOUNTER — Ambulatory Visit: Admitting: Podiatry

## 2023-11-29 DIAGNOSIS — L089 Local infection of the skin and subcutaneous tissue, unspecified: Secondary | ICD-10-CM

## 2023-11-29 DIAGNOSIS — M79674 Pain in right toe(s): Secondary | ICD-10-CM | POA: Diagnosis not present

## 2023-11-29 DIAGNOSIS — B351 Tinea unguium: Secondary | ICD-10-CM | POA: Diagnosis not present

## 2023-11-29 DIAGNOSIS — L97512 Non-pressure chronic ulcer of other part of right foot with fat layer exposed: Secondary | ICD-10-CM | POA: Diagnosis not present

## 2023-11-29 DIAGNOSIS — M79675 Pain in left toe(s): Secondary | ICD-10-CM

## 2023-11-29 MED ORDER — DOXYCYCLINE HYCLATE 100 MG PO TABS
100.0000 mg | ORAL_TABLET | Freq: Two times a day (BID) | ORAL | 0 refills | Status: AC
Start: 2023-11-29 — End: 2023-12-13
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
## 2023-12-13 ENCOUNTER — Ambulatory Visit: Admitting: Podiatry

## 2023-12-13 DIAGNOSIS — L089 Local infection of the skin and subcutaneous tissue, unspecified: Secondary | ICD-10-CM | POA: Diagnosis not present

## 2023-12-13 DIAGNOSIS — L97512 Non-pressure chronic ulcer of other part of right foot with fat layer exposed: Secondary | ICD-10-CM | POA: Diagnosis not present

## 2023-12-13 NOTE — Progress Notes (Signed)
 Remote PPM Transmission
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
## 2023-12-16 ENCOUNTER — Encounter: Payer: Self-pay | Admitting: Podiatry

## 2023-12-29 DIAGNOSIS — Z9181 History of falling: Secondary | ICD-10-CM | POA: Diagnosis not present

## 2023-12-29 DIAGNOSIS — Z0000 Encounter for general adult medical examination without abnormal findings: Secondary | ICD-10-CM | POA: Diagnosis not present

## 2024-01-10 ENCOUNTER — Inpatient Hospital Stay

## 2024-01-10 ENCOUNTER — Inpatient Hospital Stay: Admitting: Oncology

## 2024-01-27 ENCOUNTER — Other Ambulatory Visit: Payer: Self-pay | Admitting: Oncology

## 2024-01-27 ENCOUNTER — Inpatient Hospital Stay: Attending: Hematology and Oncology

## 2024-01-27 ENCOUNTER — Inpatient Hospital Stay (HOSPITAL_BASED_OUTPATIENT_CLINIC_OR_DEPARTMENT_OTHER): Admitting: Oncology

## 2024-01-27 ENCOUNTER — Encounter: Payer: Self-pay | Admitting: Oncology

## 2024-01-27 ENCOUNTER — Other Ambulatory Visit: Payer: Self-pay | Admitting: Hematology and Oncology

## 2024-01-27 VITALS — BP 115/72 | HR 73 | Temp 98.1°F | Resp 16 | Ht 60.0 in | Wt 106.2 lb

## 2024-01-27 DIAGNOSIS — I517 Cardiomegaly: Secondary | ICD-10-CM | POA: Diagnosis not present

## 2024-01-27 DIAGNOSIS — R0602 Shortness of breath: Secondary | ICD-10-CM | POA: Insufficient documentation

## 2024-01-27 DIAGNOSIS — D649 Anemia, unspecified: Secondary | ICD-10-CM | POA: Diagnosis not present

## 2024-01-27 DIAGNOSIS — Z79899 Other long term (current) drug therapy: Secondary | ICD-10-CM | POA: Insufficient documentation

## 2024-01-27 DIAGNOSIS — K862 Cyst of pancreas: Secondary | ICD-10-CM | POA: Diagnosis not present

## 2024-01-27 DIAGNOSIS — D539 Nutritional anemia, unspecified: Secondary | ICD-10-CM

## 2024-01-27 DIAGNOSIS — R918 Other nonspecific abnormal finding of lung field: Secondary | ICD-10-CM | POA: Diagnosis not present

## 2024-01-27 DIAGNOSIS — H9190 Unspecified hearing loss, unspecified ear: Secondary | ICD-10-CM | POA: Diagnosis not present

## 2024-01-27 DIAGNOSIS — C8213 Follicular lymphoma grade II, intra-abdominal lymph nodes: Secondary | ICD-10-CM | POA: Diagnosis present

## 2024-01-27 DIAGNOSIS — I2510 Atherosclerotic heart disease of native coronary artery without angina pectoris: Secondary | ICD-10-CM | POA: Insufficient documentation

## 2024-01-27 DIAGNOSIS — I700 Atherosclerosis of aorta: Secondary | ICD-10-CM | POA: Insufficient documentation

## 2024-01-27 DIAGNOSIS — R5383 Other fatigue: Secondary | ICD-10-CM | POA: Diagnosis not present

## 2024-01-27 DIAGNOSIS — D849 Immunodeficiency, unspecified: Secondary | ICD-10-CM | POA: Diagnosis not present

## 2024-01-27 LAB — CBC WITH DIFFERENTIAL (CANCER CENTER ONLY)
Abs Immature Granulocytes: 0.03 K/uL (ref 0.00–0.07)
Basophils Absolute: 0 K/uL (ref 0.0–0.1)
Basophils Relative: 0 %
Eosinophils Absolute: 0 K/uL (ref 0.0–0.5)
Eosinophils Relative: 1 %
HCT: 35.8 % — ABNORMAL LOW (ref 36.0–46.0)
Hemoglobin: 11.8 g/dL — ABNORMAL LOW (ref 12.0–15.0)
Immature Granulocytes: 0 %
Lymphocytes Relative: 17 %
Lymphs Abs: 1.2 K/uL (ref 0.7–4.0)
MCH: 29.5 pg (ref 26.0–34.0)
MCHC: 33 g/dL (ref 30.0–36.0)
MCV: 89.5 fL (ref 80.0–100.0)
Monocytes Absolute: 0.4 K/uL (ref 0.1–1.0)
Monocytes Relative: 6 %
Neutro Abs: 5.3 K/uL (ref 1.7–7.7)
Neutrophils Relative %: 76 %
Platelet Count: 184 K/uL (ref 150–400)
RBC: 4 MIL/uL (ref 3.87–5.11)
RDW: 15.9 % — ABNORMAL HIGH (ref 11.5–15.5)
WBC Count: 7 K/uL (ref 4.0–10.5)
nRBC: 0 % (ref 0.0–0.2)

## 2024-01-27 LAB — CMP (CANCER CENTER ONLY)
ALT: 10 U/L (ref 0–44)
AST: 17 U/L (ref 15–41)
Albumin: 4.3 g/dL (ref 3.5–5.0)
Alkaline Phosphatase: 81 U/L (ref 38–126)
Anion gap: 9 (ref 5–15)
BUN: 20 mg/dL (ref 8–23)
CO2: 26 mmol/L (ref 22–32)
Calcium: 8.9 mg/dL (ref 8.9–10.3)
Chloride: 107 mmol/L (ref 98–111)
Creatinine: 0.87 mg/dL (ref 0.44–1.00)
GFR, Estimated: >60 mL/min (ref >60)
Glucose, Bld: 128 mg/dL — ABNORMAL HIGH (ref 70–99)
Potassium: 4.2 mmol/L (ref 3.5–5.1)
Sodium: 142 mmol/L (ref 135–145)
Total Bilirubin: 0.4 mg/dL (ref 0.0–1.2)
Total Protein: 6.1 g/dL — ABNORMAL LOW (ref 6.5–8.1)

## 2024-01-27 MED ORDER — TRAMADOL HCL 50 MG PO TABS: 50.0000 mg | ORAL_TABLET | Freq: Every day | ORAL | 0 refills | Status: DC

## 2024-01-27 MED ORDER — MECLIZINE HCL 25 MG PO TABS: 25.0000 mg | ORAL_TABLET | Freq: Three times a day (TID) | ORAL | 0 refills | Status: AC | PRN

## 2024-01-27 NOTE — Progress Notes (Signed)
 [facility]  [address] [place],  [place]  [address] [phone]  Clinic Day: 01/27/2024  Referring physician: Keren Vicenta BRAVO, MD  ASSESSMENT & PLAN:  Assessment: Follicular lymphoma grade II of intra-abdominal lymph nodes [facility]) CT chest, abdomen and pelvis in July 2022 revealed with stable lymphadenopathy, but a slight increase in the pulmonary nodules. We decided against further evaluation with PET. CT imaging from January 2023 is stable to slightly improved. Repeat CT from July 2023 is stable with decrease in the left peri-aortic node, but with mild increase in the lung nodules. Her last scan was in August. 2024 and there is not significant change in her new scan currently. CT chest, abdomen and pelvis done on 04/18/2023 revealed no significant change in her lymphoma. She has bilateral pulmonary nodules with the larger nodules measuring 2.9 x 2.2 cm in the right upper lobe, 4.2 x 2.0 cm in the right medial lobe, and 1.6 x 1.1 cm in the left lower lobe that are consistent with pulmonary lymphomatous involvement. There are unchanged matted appearing left retroperitoneal lymph nodes, and no new lymphadenopathy or soft tissue disease in the chest, abdomen, or pelvis. She has decided she is not interested in any active therapy, understandable at her age, but fortunately her disease is stable.   Multiple Lung Nodules I suspect these are also lymphoma but we have not biopsied them to prove it. At least 3 are slowly but steadily enlarging consistent with progression of her lymphoma. We have held off on treatment due to her age and potential toxicities and she still remains largely asymptomatic from these lesions.    Pancreatic Cyst CT scan done on 04/18/2023 reveals an unchanged lobulated cystic lesion of the pancreatic tail, measuring 2.3 x 1.2 cm, with distal ductal dilatation and atrophy were also found. This most likely reflects an IPMN. In view of her age and stability, we will  not pursue further evaluation or treatment.   Non-healing wound of anterior left ankle She has a history of multiple surgeries and skin grafting years ago but now has a persistent draining wound. She eventually went to the wound center and required admission to the hospital in August of 2024 with findings of osteomyelitis, she was on IV antibiotics and was treated for a prolonged period of time. Cultures revealed Staphylococcus Aureus, MSSA and repeat cultures from January 2025 still show Staph aureus. MRI of her right foot done on 05/03/2023 does not show osteomyelitis. This revealed no acute abnormality and resolved marrow edema in the anterior distal tibia and talus. It is unfortunate that she is starting to have drainage again, but it is minimal. I think that it represents a chronic soft tissue infection and not osteomyelitis.    IgG Immunodeficiency Her IgG level is low at 529 with normal IgA and IgM. This is most likely due to her lymphoma but contributes to her immunodeficiency. She has declined IVIG.   Plan: She was treated last summer with prolonged antibiotics for persistent wound of the right anterior ankle/foot and cultures at that time revealed methicillin sensitive Staphylococcus Aureus (MSSA).  An MRI scan at that time was equivocal for osteomyelitis.  The organism was sensitive to Ciprofloxacin  but the patient had stomach upset with that, so we tried Augmentin  but it was hard to swallow and gave her severe diarrhea. She canceled her IVIG infusion which we had recommended for her combined immunodeficiency. Patient states that she feels well and has pain and numbness of the right hand.  She states that her right ankle wound began draining again 2-3 weeks after having no drainage for many months. She was given two courses of antibiotics by Dr. Keren and feels they did not help. She states that the drainage varies between clear and sanguineous. I find no drainage at this time. She continues to  have numbness of the right hand and is taking 200 mg gabapentin  nightly and reports excessive daytime drowsiness. I informed her that she could decrease her dose to 100 mg if she feels it is too strong. She would like a refill of her 50 mg tramadol  and 25 mg meclizine today and I will do this for her. She has a chronic soft tissue infection of her right ankle, but this is not osteomyelitis. Our previous culture showed methicillin-sensitive Staphylococcus Aureus (MSSA). She has a WBC of 4.00, a low hemoglobin of 11.8 improved from 10.8, and platelet count of 184,000. Her CMP is completely normal other than a low total protein of 6.1. I will see her back in 3-4 months with CBC and CMP. The patient understands the plans discussed today and are in agreement with them.  They know to contact our office if she develops concerns prior to her next appointment.  She reminded me that all the things on her allergy list she is not allergic too, she has previously tried to have this taken off her list and she denies allergy to penicillin. She has taken Amoxicillin  for her dentist appointments.  I provided 31 minutes of face-to-face time during this this encounter and > 50% was spent counseling as documented under my assessment and plan.   Wanda VEAR Cornish, MD  [facility] - A DEPT OF [facility] [address] [place] [address] Dept: [phone] Dept Fax: [phone]    No orders of the defined types were placed in this encounter.   CHIEF COMPLAINT:  CC: Follicular lymphoma Grade II follicular of intra-abdominal lymph nodes (HCC)  Current Treatment:  Observation  HISTORY OF PRESENT ILLNESS:   Oncology History  Follicular lymphoma grade II of intra-abdominal lymph nodes (HCC)  11/13/2019 Initial Diagnosis   Follicular lymphoma grade II of intra-abdominal lymph nodes (HCC)   12/14/2019 Cancer Staging   Staging form: Hodgkin and Non-Hodgkin  Lymphoma, AJCC 8th Edition - Clinical stage from 12/14/2019: Stage I (Follicular lymphoma) - Signed by Cornish Wanda VEAR, MD on 01/31/2020     INTERVAL HISTORY:  Julia Hunt is here for routine follow up for grade 2 follicular lymphoma of intra-abdominal lymph nodes (HCC) and lung. She was treated last summer with prolonged antibiotics for persistent wound of the right anterior ankle/foot and cultures at that time revealed methicillin sensitive Staphylococcus Aureus (MSSA).  An MRI scan at that time was equivocal for osteomyelitis.  The organism was sensitive to Ciprofloxacin  but the patient had stomach upset with that recently, so we tried Augmentin  but it was hard to swallow and gave her severe diarrhea. She canceled her IVIG infusion which we had recommended for her combined immunodeficiency. Patient states that she feels well and has pain and numbness of the right hand. She states that her right ankle wound began draining again 2-3 weeks after having no drainage for many months. She was given two courses of antibiotics by Dr. Keren and feels they did not help. She states that the drainage varies between clear and sanguineous. I find no drainage at this time. She continues to have numbness of the  right hand and is taking 200 mg gabapentin  nightly and reports excessive daytime drowsiness. I informed her that she could decrease her dose to 100 mg if she feels it is too strong. She would like a refill of her 50 mg tramadol  and 25 mg meclizine today and I will do this for her.  She had an MRI of her right ankle on 11/15/2023 which revealed chronic inflammatory changes involving the anterior soft tissues along the distal tibial shaft and ankle with chronic tendinopathy and adjacent chronic myofasciitis involving the anterior tibialis muscle with no discrete fluid collection to suggest a drainable soft tissue abscess or pyomyositis, no findings for septic arthritis or osteomyelitis, remote solid tibiotalar fusion  without complicating features, stable advanced subtalar joint degenerative changes and small effusion involving the posterior talocalcaneal facet, and stable chronic cortical irregularity involving the anterolateral cortex of the distal tibia possibly related to remote osteomyelitis. She has a chronic soft tissue infection of her right ankle, but this is not osteomyelitis. Our previous culture showed methicillin-sensitive Staphylococcus Aureus (MSSA). She has a WBC of 4.00, a low hemoglobin of 11.8 improved from 10.8, and platelet count of 184,000. Her CMP is completely other than a low total protein of 6.1. I will see her back in 3-4 months with CBC and CMP. She denies fever, chills, night sweats, or other signs of infection. She denies cardiorespiratory and gastrointestinal issues. She  denies pain. Her appetite is great. Her weight has increased 3 pounds over last 8 weeks.  REVIEW OF SYSTEMS:  Review of Systems  Constitutional:  Positive for fatigue. Negative for appetite change, chills, diaphoresis, fever and unexpected weight change.  HENT:   Positive for hearing loss. Negative for lump/mass, mouth sores, nosebleeds, sore throat, tinnitus, trouble swallowing and voice change.   Eyes:  Positive for eye problems. Negative for icterus.  Respiratory:  Positive for shortness of breath (with exertion). Negative for chest tightness, cough, hemoptysis and wheezing.   Cardiovascular: Negative.  Negative for chest pain, leg swelling and palpitations.  Gastrointestinal:  Negative for abdominal distention, abdominal pain, blood in stool, constipation, diarrhea, nausea, rectal pain and vomiting.  Endocrine: Negative.   Genitourinary: Negative.  Negative for bladder incontinence, difficulty urinating, dyspareunia, dysuria, frequency, hematuria, menstrual problem, nocturia, pelvic pain, vaginal bleeding and vaginal discharge.   Musculoskeletal:  Positive for back pain (Chronic) and gait problem. Negative for  arthralgias, flank pain, myalgias, neck pain and neck stiffness.       Pain in her right wrist/hand with dupuytren contracture and neuropathy and arthritis.    Skin:  Positive for wound (chronic, draining wound which is improved). Negative for itching and rash.  Neurological:  Positive for gait problem and numbness (first two fingers of the right hand). Negative for dizziness, extremity weakness, headaches, light-headedness, seizures and speech difficulty.  Hematological: Negative.  Negative for adenopathy. Does not bruise/bleed easily.  Psychiatric/Behavioral:  Negative for confusion, decreased concentration, depression, sleep disturbance and suicidal ideas. The patient is not nervous/anxious.        Memory loss   VITALS:  Blood pressure 115/72, pulse 73, temperature 98.1 F (36.7 C), temperature source Oral, resp. rate 16, height 5' (1.524 m), weight 106 lb 3.2 oz (48.2 kg), SpO2 97%.  Wt Readings from Last 3 Encounters:  01/27/24 106 lb 3.2 oz (48.2 kg)  11/21/23 103 lb 12.8 oz (47.1 kg)  10/06/23 111 lb 4.8 oz (50.5 kg)    Body mass index is 20.74 kg/m.  Performance status (ECOG): 1 -  Symptomatic but completely ambulatory  PHYSICAL EXAM:  Physical Exam Vitals and nursing note reviewed. Exam conducted with a chaperone present.  Constitutional:      General: She is not in acute distress.    Appearance: Normal appearance. She is normal weight. She is not ill-appearing, toxic-appearing or diaphoretic.  HENT:     Head: Normocephalic and atraumatic.     Right Ear: Tympanic membrane, ear canal and external ear normal. There is no impacted cerumen.     Left Ear: Tympanic membrane, ear canal and external ear normal. There is no impacted cerumen.     Nose: Nose normal. No congestion or rhinorrhea.     Mouth/Throat:     Mouth: Mucous membranes are moist.     Pharynx: Oropharynx is clear. No oropharyngeal exudate or posterior oropharyngeal erythema.  Eyes:     General: No scleral  icterus.       Right eye: No discharge.        Left eye: No discharge.     Extraocular Movements: Extraocular movements intact.     Conjunctiva/sclera: Conjunctivae normal.     Pupils: Pupils are equal, round, and reactive to light.  Neck:     Vascular: No carotid bruit.  Cardiovascular:     Rate and Rhythm: Normal rate and regular rhythm.     Pulses: Normal pulses.     Heart sounds: Normal heart sounds. No murmur heard.    No friction rub. No gallop.  Pulmonary:     Effort: Pulmonary effort is normal. No respiratory distress.     Breath sounds: Normal breath sounds. No stridor. No wheezing, rhonchi or rales.  Chest:     Chest wall: No tenderness.     Comments: Pacemaker in her left upper chest No masses in either breast Abdominal:     General: Bowel sounds are normal. There is no distension.     Palpations: Abdomen is soft. There is no hepatomegaly, splenomegaly or mass.     Tenderness: There is no abdominal tenderness. There is no right CVA tenderness, left CVA tenderness, guarding or rebound.     Hernia: No hernia is present.     Comments: Lower mid abdominal incision Abdomen feels doughy and soft  Musculoskeletal:        General: No swelling, tenderness, deformity or signs of injury. Normal range of motion.     Cervical back: Normal range of motion and neck supple. No rigidity or tenderness.     Right lower leg: No edema.     Left lower leg: No edema.     Left ankle: No swelling.     Right foot: No swelling.     Comments: Brace of the right leg with atrophy of both legs from prior Polio, left < right.  Feet:     Comments: Her left upper ankle has some chronic edema Her left foot has 1+ edema. The wound of the anterior right ankle is dry without drainage and does not appear hot, warm, or erythematous.  There is an area of skin irritation from the adhesive Lymphadenopathy:     Cervical: No cervical adenopathy.     Right cervical: No superficial, deep or posterior cervical  adenopathy.    Left cervical: No superficial, deep or posterior cervical adenopathy.     Upper Body:     Right upper body: No supraclavicular, axillary or pectoral adenopathy.     Left upper body: No supraclavicular, axillary or pectoral adenopathy.  Skin:    General:  Skin is warm and dry.     Coloration: Skin is not jaundiced or pale.     Findings: Wound (right ankle) present. No bruising, erythema, lesion or rash.     Comments: Wound anterior dorsal surface of the ankle with some desquamation, purple coloration, and shiny skin. She has some slightly raised areas.   Neurological:     General: No focal deficit present.     Mental Status: She is alert and oriented to person, place, and time. Mental status is at baseline.     Cranial Nerves: No cranial nerve deficit.     Sensory: No sensory deficit.     Motor: No weakness.     Coordination: Coordination normal.     Gait: Gait normal.     Deep Tendon Reflexes: Reflexes normal.  Psychiatric:        Mood and Affect: Mood normal.        Behavior: Behavior normal.        Thought Content: Thought content normal.        Judgment: Judgment normal.    LABS:      Latest Ref Rng & Units 01/27/2024    2:07 PM 10/06/2023    2:10 PM 09/07/2023    9:52 AM  CBC  WBC 4.0 - 10.5 K/uL 7.0  6.0  7.2   Hemoglobin 12.0 - 15.0 g/dL 88.1  89.1  87.8   Hematocrit 36.0 - 46.0 % 35.8  33.0  37.2   Platelets 150 - 400 K/uL 184  141  186       Latest Ref Rng & Units 01/27/2024    2:07 PM 10/06/2023    2:10 PM 09/07/2023    9:52 AM  CMP  Glucose 70 - 99 mg/dL 871  89  78   BUN 8 - 23 mg/dL 20  24  22    Creatinine 0.44 - 1.00 mg/dL 9.12  9.17  9.08   Sodium 135 - 145 mmol/L 142  142  143   Potassium 3.5 - 5.1 mmol/L 4.2  4.2  4.2   Chloride 98 - 111 mmol/L 107  108  106   CO2 22 - 32 mmol/L 26  24  26    Calcium  8.9 - 10.3 mg/dL 8.9  9.3  9.6   Total Protein 6.5 - 8.1 g/dL 6.1  6.0  6.3   Total Bilirubin 0.0 - 1.2 mg/dL 0.4  0.3  0.5   Alkaline Phos  38 - 126 U/L 81  86  89   AST 15 - 41 U/L 17  18  17    ALT 0 - 44 U/L 10  12  9     Lab Results  Component Value Date   CEA1 5.3 (H) 10/01/2020   /  CEA  Date Value Ref Range Status  10/01/2020 5.3 (H) 0.0 - 4.7 ng/mL Final    Comment:    (NOTE)                             Nonsmokers          <3.9                             Smokers             <5.6 [employer] Electrochemiluminescence Immunoassay (ECLIA) Values obtained with different assay methods or kits cannot be used interchangeably.  Results cannot be  interpreted as absolute evidence of the presence or absence of malignant disease. Performed At: [facility] [address] [place], [place] [id number] Jennette Shorter MD Ey:[id number]    Lab Results  Component Value Date   LDH 181 07/16/2021   LDH 175 01/14/2021   LDH 549 10/01/2020    STUDIES:  EXAM: 11/15/2023 MRI OF THE RIGHT ANKLE WITHOUT CONTRAST IMPRESSION: 1. Chronic inflammatory changes involving the anterior soft tissues along the distal tibial shaft and ankle with chronic tendinopathy and adjacent chronic myofasciitis involving the anterior tibialis muscle. No discrete fluid collection to suggest a drainable soft tissue abscess or pyomyositis. 2. No findings for septic arthritis or osteomyelitis. 3. Remote solid tibiotalar fusion without complicating features. 4. Stable advanced subtalar joint degenerative changes and small effusion involving the posterior talocalcaneal facet. 5. Stable chronic cortical irregularity involving the anterolateral cortex of the distal tibia possibly related to remote osteomyelitis.  EXAM: 04/18/2023 CT CHEST, ABDOMEN, AND PELVIS WITH CONTRAST IMPRESSION: 1. No significant change in bilateral pulmonary nodules, consistent with pulmonary lymphomatous involvement. 2. Unchanged matted appearing left retroperitoneal lymph nodes. 3. No new lymphadenopathy or soft tissue disease in the chest, abdomen, or pelvis. 4.  Unchanged lobulated cystic lesion of the pancreatic tail, measuring 2.3 x 1.2 cm, with distal ductal dilatation and atrophy. This most likely reflects an IPMN, and despite suggestion of main duct involvement is of very doubtful clinical significance given advanced patient age greater than 8. Attention on follow-up. 5. Cardiomegaly and coronary artery disease. 6. Aortic Atherosclerosis (ICD10-I70.0).   HISTORY:   Allergies:  Allergies  Allergen Reactions   Clarithromycin Other (See Comments) and Rash    Pt states she is not allergic.   Penicillins Other (See Comments) and Rash    Pt states she is not allergic.   Sulfa Antibiotics Other (See Comments) and Rash    Pt states she is not allergic.    Current Medications: Current Outpatient Medications  Medication Sig Dispense Refill   Calcium  Carbonate-Vitamin D  600-400 MG-UNIT tablet Take 1 tablet by mouth daily.     gabapentin  (NEURONTIN ) 100 MG capsule Take 2 capsules (200 mg total) by mouth at bedtime. Has not started this medication yet as of 05/12/23 60 capsule 5   levothyroxine  (SYNTHROID ) 88 MCG tablet Take 88 mcg by mouth daily before breakfast.     loperamide (IMODIUM A-D) 2 MG tablet Take 2 mg by mouth as directed.     meclizine (ANTIVERT) 25 MG tablet Take 1 tablet (25 mg total) by mouth 3 (three) times daily as needed. 30 tablet 0   Multiple Vitamin (MULTIVITAMIN) capsule Take 1 capsule by mouth daily.     omeprazole (PRILOSEC) 20 MG capsule Take 20 mg by mouth as needed (heartburn).     sertraline  (ZOLOFT ) 100 MG tablet Take 100 mg by mouth daily.     traMADol  (ULTRAM ) 50 MG tablet Take 1-2 tablets (50-100 mg total) by mouth daily. 30 tablet 0   No current facility-administered medications for this visit.    I,Allex Madia H Roy Snuffer,acting as a scribe for Wanda VEAR Cornish, MD.,have documented all relevant documentation on the behalf of Wanda VEAR Cornish, MD,as directed by  Wanda VEAR Cornish, MD while in the presence of  Wanda VEAR Cornish, MD.  I have reviewed this report as typed by the medical scribe, and it is complete and accurate.

## 2024-02-09 ENCOUNTER — Encounter: Payer: Self-pay | Admitting: Oncology

## 2024-02-15 ENCOUNTER — Telehealth: Payer: Self-pay

## 2024-02-15 NOTE — Telephone Encounter (Signed)
 Received call from [facility] w/Palliative Care program - [facility]. The NP, Waddell, is going out to make a home visit today for status change. If pt is interested in Hospice services @ this time, is she Hospice appropriate per your opinion? If she is, would you like to remain pt's admitting physician? They realize pt saw you recently. Please advise.

## 2024-02-16 ENCOUNTER — Encounter: Payer: Self-pay | Admitting: Oncology

## 2024-02-21 ENCOUNTER — Ambulatory Visit (INDEPENDENT_AMBULATORY_CARE_PROVIDER_SITE_OTHER): Payer: Medicare Other

## 2024-02-21 DIAGNOSIS — I452 Bifascicular block: Secondary | ICD-10-CM | POA: Diagnosis not present

## 2024-02-21 LAB — CUP PACEART REMOTE DEVICE CHECK
Battery Remaining Longevity: 36 mo
Battery Remaining Percentage: 33 %
Battery Voltage: 2.95 V
Brady Statistic AP VP Percent: 53 %
Brady Statistic AP VS Percent: 2.8 %
Brady Statistic AS VP Percent: 6.4 %
Brady Statistic AS VS Percent: 38 %
Brady Statistic RA Percent Paced: 55 %
Brady Statistic RV Percent Paced: 59 %
Date Time Interrogation Session: 20251125034744
Implantable Lead Connection Status: 753985
Implantable Lead Connection Status: 753985
Implantable Lead Implant Date: 20190205
Implantable Lead Implant Date: 20190205
Implantable Lead Location: 753859
Implantable Lead Location: 753860
Implantable Pulse Generator Implant Date: 20190205
Lead Channel Impedance Value: 340 Ohm
Lead Channel Impedance Value: 490 Ohm
Lead Channel Pacing Threshold Amplitude: 0.5 V
Lead Channel Pacing Threshold Amplitude: 0.625 V
Lead Channel Pacing Threshold Pulse Width: 0.5 ms
Lead Channel Pacing Threshold Pulse Width: 0.5 ms
Lead Channel Sensing Intrinsic Amplitude: 1.8 mV
Lead Channel Sensing Intrinsic Amplitude: 6.6 mV
Lead Channel Setting Pacing Amplitude: 0.875
Lead Channel Setting Pacing Amplitude: 2 V
Lead Channel Setting Pacing Pulse Width: 0.5 ms
Lead Channel Setting Sensing Sensitivity: 2 mV
Pulse Gen Model: 2272
Pulse Gen Serial Number: 8993229

## 2024-02-22 ENCOUNTER — Ambulatory Visit: Payer: Self-pay | Admitting: Cardiology

## 2024-02-22 ENCOUNTER — Telehealth: Payer: Self-pay

## 2024-02-22 NOTE — Progress Notes (Signed)
 Remote PPM Transmission

## 2024-02-22 NOTE — Telephone Encounter (Signed)
 Julia Hunt, nurse with Hospice called to ask if it is ok with you to move pt from [facility] services to Hosp[facility] services. The [facility] NP went into the home for visit and feels she is appropriate. Do you agree and if so, would you serve as attending?

## 2024-02-27 ENCOUNTER — Other Ambulatory Visit (HOSPITAL_BASED_OUTPATIENT_CLINIC_OR_DEPARTMENT_OTHER): Payer: Self-pay

## 2024-02-27 ENCOUNTER — Telehealth: Payer: Self-pay

## 2024-02-27 NOTE — Telephone Encounter (Signed)
 Pt LVM on nurse line that she would like an appt with Dr Cornelius. I am having a problem with my foot. It is swelling. Does she need to see her PCP for this?

## 2024-02-27 NOTE — Telephone Encounter (Signed)
 [facility] staff went out to speak with pt regarding Hospice services. Pt declines Hospice services at this time.

## 2024-03-02 ENCOUNTER — Telehealth: Payer: Self-pay

## 2024-03-02 NOTE — Telephone Encounter (Signed)
 Attempted x2 to contact patient. Someone picks up and then hangs up.

## 2024-03-02 NOTE — Telephone Encounter (Signed)
 [facility] [phone]. Hospice called and stated patient was approved for hospice but when intake nurse went out patient declined services. They have contacted her granddaughter and are trying to set up another meeting to see if they can come to a understanding since patient is unsure. Patient let hospice know her ankle is red and sore and she wants it checked. I tried to reach out to her to get some info on her ankle it sounds like someone is picking up the phone then hanging up I tried a few times same response each time. The intake was not done today so hospice did not have any details on her ankle other than the call it was painful and red.

## 2024-03-04 ENCOUNTER — Encounter (HOSPITAL_BASED_OUTPATIENT_CLINIC_OR_DEPARTMENT_OTHER): Payer: Self-pay

## 2024-03-04 ENCOUNTER — Inpatient Hospital Stay (HOSPITAL_BASED_OUTPATIENT_CLINIC_OR_DEPARTMENT_OTHER): Admit: 2024-03-04 | Discharge: 2024-03-04 | Attending: Family Medicine

## 2024-03-04 VITALS — BP 143/91 | HR 94 | Temp 98.7°F | Resp 20

## 2024-03-04 DIAGNOSIS — M7989 Other specified soft tissue disorders: Secondary | ICD-10-CM

## 2024-03-04 MED ORDER — TRIAMCINOLONE ACETONIDE 0.1 % EX CREA: 1.0000 | TOPICAL_CREAM | Freq: Two times a day (BID) | CUTANEOUS | 0 refills | Status: AC

## 2024-03-04 NOTE — ED Triage Notes (Signed)
 Pt c/o left ankle/ lower leg pain, swelling, and itching for the last 2 days. Pt has not taken anything for the pain.

## 2024-03-04 NOTE — Discharge Instructions (Signed)
 Apply the steroid cream to the area. If this worsens please see your doctor.
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
## 2024-03-19 ENCOUNTER — Telehealth: Payer: Self-pay

## 2024-03-19 NOTE — Telephone Encounter (Signed)
 Melissa, Hospice nurse called to ask if you wanted to be attending, or would rather Hospice provider do so.  I guess with dx of protein calorie malnutrition, and not lymphoma - they just wanted to make sure.

## 2024-03-20 ENCOUNTER — Encounter: Payer: Self-pay | Admitting: Oncology

## 2024-04-09 ENCOUNTER — Encounter: Payer: Self-pay | Admitting: Oncology

## 2024-04-17 ENCOUNTER — Other Ambulatory Visit: Payer: Self-pay | Admitting: Hematology and Oncology

## 2024-04-17 ENCOUNTER — Telehealth: Payer: Self-pay

## 2024-04-17 DIAGNOSIS — G8929 Other chronic pain: Secondary | ICD-10-CM

## 2024-04-17 DIAGNOSIS — G14 Postpolio syndrome: Secondary | ICD-10-CM

## 2024-04-17 MED ORDER — TRAMADOL HCL 50 MG PO TABS: 50.0000 mg | ORAL_TABLET | Freq: Four times a day (QID) | ORAL | 0 refills | Status: AC | PRN

## 2024-04-17 MED ORDER — GABAPENTIN 100 MG PO CAPS: 300.0000 mg | ORAL_CAPSULE | Freq: Every day | ORAL | 5 refills | Status: AC

## 2024-04-17 NOTE — Telephone Encounter (Signed)
 Pt has been taking 1 gabapentin  100mg  @ HS and 1 tramadol  50mg  per day for neuropathic pain in right hand. Nurse inquiring if we could increase dosage.

## 2024-05-09 ENCOUNTER — Inpatient Hospital Stay: Attending: Hematology and Oncology | Admitting: Oncology

## 2024-05-09 ENCOUNTER — Inpatient Hospital Stay

## 2024-05-22 ENCOUNTER — Ambulatory Visit: Payer: Medicare Other

## 2024-08-21 ENCOUNTER — Ambulatory Visit: Payer: Medicare Other

## 2024-11-20 ENCOUNTER — Ambulatory Visit: Payer: Medicare Other

## 2025-02-19 ENCOUNTER — Ambulatory Visit: Payer: Medicare Other

## 2025-05-21 ENCOUNTER — Ambulatory Visit: Payer: Medicare Other

## 2025-08-20 ENCOUNTER — Ambulatory Visit: Payer: Medicare Other
